# Patient Record
Sex: Female | Born: 1950 | Race: White | Hispanic: No | Smoking: Never smoker
Health system: Southern US, Community
[De-identification: ages and names within clinical notes are randomized; demographics above are authoritative.]

## PROBLEM LIST (undated history)

## (undated) DIAGNOSIS — N183 Chronic kidney disease, stage 3 unspecified: Secondary | ICD-10-CM

## (undated) DIAGNOSIS — S42309A Unspecified fracture of shaft of humerus, unspecified arm, initial encounter for closed fracture: Secondary | ICD-10-CM

## (undated) DIAGNOSIS — I493 Ventricular premature depolarization: Secondary | ICD-10-CM

## (undated) DIAGNOSIS — Z96612 Presence of left artificial shoulder joint: Principal | ICD-10-CM

## (undated) DIAGNOSIS — I517 Cardiomegaly: Secondary | ICD-10-CM

## (undated) DIAGNOSIS — Z8719 Personal history of other diseases of the digestive system: Secondary | ICD-10-CM

## (undated) DIAGNOSIS — F32A Depression, unspecified: Secondary | ICD-10-CM

## (undated) DIAGNOSIS — F329 Major depressive disorder, single episode, unspecified: Secondary | ICD-10-CM

## (undated) DIAGNOSIS — E039 Hypothyroidism, unspecified: Secondary | ICD-10-CM

## (undated) DIAGNOSIS — I10 Essential (primary) hypertension: Secondary | ICD-10-CM

## (undated) DIAGNOSIS — N184 Chronic kidney disease, stage 4 (severe): Secondary | ICD-10-CM

## (undated) DIAGNOSIS — M5412 Radiculopathy, cervical region: Secondary | ICD-10-CM

## (undated) DIAGNOSIS — J189 Pneumonia, unspecified organism: Secondary | ICD-10-CM

## (undated) DIAGNOSIS — R7303 Prediabetes: Secondary | ICD-10-CM

## (undated) DIAGNOSIS — I700 Atherosclerosis of aorta: Secondary | ICD-10-CM

## (undated) DIAGNOSIS — J9601 Acute respiratory failure with hypoxia: Secondary | ICD-10-CM

## (undated) DIAGNOSIS — G8929 Other chronic pain: Secondary | ICD-10-CM

## (undated) DIAGNOSIS — M1990 Unspecified osteoarthritis, unspecified site: Secondary | ICD-10-CM

## (undated) DIAGNOSIS — I519 Heart disease, unspecified: Secondary | ICD-10-CM

## (undated) DIAGNOSIS — F419 Anxiety disorder, unspecified: Secondary | ICD-10-CM

## (undated) DIAGNOSIS — M25512 Pain in left shoulder: Secondary | ICD-10-CM

## (undated) DIAGNOSIS — N189 Chronic kidney disease, unspecified: Secondary | ICD-10-CM

## (undated) DIAGNOSIS — J45909 Unspecified asthma, uncomplicated: Secondary | ICD-10-CM

## (undated) DIAGNOSIS — D649 Anemia, unspecified: Secondary | ICD-10-CM

## (undated) HISTORY — PX: APPENDECTOMY: SHX54

## (undated) HISTORY — DX: Unspecified osteoarthritis, unspecified site: M19.90

## (undated) HISTORY — DX: Pneumonia, unspecified organism: J18.9

## (undated) HISTORY — DX: Depression, unspecified: F32.A

## (undated) HISTORY — DX: Chronic kidney disease, stage 3 (moderate): N18.3

## (undated) HISTORY — DX: Anxiety disorder, unspecified: F41.9

## (undated) HISTORY — DX: Ventricular premature depolarization: I49.3

## (undated) HISTORY — DX: Major depressive disorder, single episode, unspecified: F32.9

## (undated) HISTORY — PX: TUBAL LIGATION: SHX77

## (undated) HISTORY — DX: Chronic kidney disease, stage 3 unspecified: N18.30

## (undated) HISTORY — DX: Acute respiratory failure with hypoxia: J96.01

## (undated) HISTORY — DX: Presence of left artificial shoulder joint: Z96.612

## (undated) HISTORY — DX: Chronic kidney disease, stage 4 (severe): N18.4

---

## 2013-07-09 DIAGNOSIS — Z9181 History of falling: Secondary | ICD-10-CM | POA: Diagnosis not present

## 2013-07-09 DIAGNOSIS — Z0000 Encounter for general adult medical examination without abnormal findings: Secondary | ICD-10-CM | POA: Diagnosis not present

## 2013-07-09 DIAGNOSIS — Z1331 Encounter for screening for depression: Secondary | ICD-10-CM | POA: Diagnosis not present

## 2013-07-09 DIAGNOSIS — Z6841 Body Mass Index (BMI) 40.0 and over, adult: Secondary | ICD-10-CM | POA: Diagnosis not present

## 2013-07-09 DIAGNOSIS — I10 Essential (primary) hypertension: Secondary | ICD-10-CM | POA: Diagnosis not present

## 2013-07-09 DIAGNOSIS — Z23 Encounter for immunization: Secondary | ICD-10-CM | POA: Diagnosis not present

## 2013-08-04 DIAGNOSIS — Z6841 Body Mass Index (BMI) 40.0 and over, adult: Secondary | ICD-10-CM | POA: Diagnosis not present

## 2013-08-04 DIAGNOSIS — R7309 Other abnormal glucose: Secondary | ICD-10-CM | POA: Diagnosis not present

## 2013-08-04 DIAGNOSIS — Z124 Encounter for screening for malignant neoplasm of cervix: Secondary | ICD-10-CM | POA: Diagnosis not present

## 2013-08-04 DIAGNOSIS — Z780 Asymptomatic menopausal state: Secondary | ICD-10-CM | POA: Diagnosis not present

## 2013-08-05 DIAGNOSIS — Z1231 Encounter for screening mammogram for malignant neoplasm of breast: Secondary | ICD-10-CM | POA: Diagnosis not present

## 2013-08-07 DIAGNOSIS — E119 Type 2 diabetes mellitus without complications: Secondary | ICD-10-CM | POA: Diagnosis not present

## 2013-08-07 DIAGNOSIS — I10 Essential (primary) hypertension: Secondary | ICD-10-CM | POA: Diagnosis not present

## 2013-08-07 DIAGNOSIS — Z6841 Body Mass Index (BMI) 40.0 and over, adult: Secondary | ICD-10-CM | POA: Diagnosis not present

## 2013-08-08 DIAGNOSIS — E119 Type 2 diabetes mellitus without complications: Secondary | ICD-10-CM | POA: Diagnosis not present

## 2013-09-04 DIAGNOSIS — I10 Essential (primary) hypertension: Secondary | ICD-10-CM | POA: Diagnosis not present

## 2013-09-04 DIAGNOSIS — Z6841 Body Mass Index (BMI) 40.0 and over, adult: Secondary | ICD-10-CM | POA: Diagnosis not present

## 2013-09-26 DIAGNOSIS — E119 Type 2 diabetes mellitus without complications: Secondary | ICD-10-CM | POA: Diagnosis not present

## 2013-12-11 DIAGNOSIS — Z6841 Body Mass Index (BMI) 40.0 and over, adult: Secondary | ICD-10-CM | POA: Diagnosis not present

## 2013-12-11 DIAGNOSIS — E785 Hyperlipidemia, unspecified: Secondary | ICD-10-CM | POA: Diagnosis not present

## 2013-12-11 DIAGNOSIS — E119 Type 2 diabetes mellitus without complications: Secondary | ICD-10-CM | POA: Diagnosis not present

## 2013-12-11 DIAGNOSIS — I10 Essential (primary) hypertension: Secondary | ICD-10-CM | POA: Diagnosis not present

## 2014-02-18 DIAGNOSIS — Z13820 Encounter for screening for osteoporosis: Secondary | ICD-10-CM | POA: Diagnosis not present

## 2014-04-08 DIAGNOSIS — Z6841 Body Mass Index (BMI) 40.0 and over, adult: Secondary | ICD-10-CM | POA: Diagnosis not present

## 2014-04-08 DIAGNOSIS — G5600 Carpal tunnel syndrome, unspecified upper limb: Secondary | ICD-10-CM | POA: Diagnosis not present

## 2014-04-08 DIAGNOSIS — R252 Cramp and spasm: Secondary | ICD-10-CM | POA: Diagnosis not present

## 2014-06-23 DIAGNOSIS — I10 Essential (primary) hypertension: Secondary | ICD-10-CM | POA: Diagnosis not present

## 2014-06-23 DIAGNOSIS — E785 Hyperlipidemia, unspecified: Secondary | ICD-10-CM | POA: Diagnosis not present

## 2014-06-23 DIAGNOSIS — G4762 Sleep related leg cramps: Secondary | ICD-10-CM | POA: Diagnosis not present

## 2014-06-23 DIAGNOSIS — Z6841 Body Mass Index (BMI) 40.0 and over, adult: Secondary | ICD-10-CM | POA: Diagnosis not present

## 2014-06-23 DIAGNOSIS — M25561 Pain in right knee: Secondary | ICD-10-CM | POA: Diagnosis not present

## 2014-06-23 DIAGNOSIS — E119 Type 2 diabetes mellitus without complications: Secondary | ICD-10-CM | POA: Diagnosis not present

## 2014-06-23 DIAGNOSIS — S8991XA Unspecified injury of right lower leg, initial encounter: Secondary | ICD-10-CM | POA: Diagnosis not present

## 2014-06-23 DIAGNOSIS — M1711 Unilateral primary osteoarthritis, right knee: Secondary | ICD-10-CM | POA: Diagnosis not present

## 2014-06-23 DIAGNOSIS — R296 Repeated falls: Secondary | ICD-10-CM | POA: Diagnosis not present

## 2014-07-10 DIAGNOSIS — M79661 Pain in right lower leg: Secondary | ICD-10-CM | POA: Diagnosis not present

## 2014-07-10 DIAGNOSIS — M1711 Unilateral primary osteoarthritis, right knee: Secondary | ICD-10-CM | POA: Diagnosis not present

## 2014-07-10 DIAGNOSIS — M25661 Stiffness of right knee, not elsewhere classified: Secondary | ICD-10-CM | POA: Diagnosis not present

## 2014-07-29 DIAGNOSIS — M25561 Pain in right knee: Secondary | ICD-10-CM | POA: Diagnosis not present

## 2014-07-29 DIAGNOSIS — M1712 Unilateral primary osteoarthritis, left knee: Secondary | ICD-10-CM | POA: Diagnosis not present

## 2014-07-29 DIAGNOSIS — M1711 Unilateral primary osteoarthritis, right knee: Secondary | ICD-10-CM | POA: Diagnosis not present

## 2014-08-04 DIAGNOSIS — Z6841 Body Mass Index (BMI) 40.0 and over, adult: Secondary | ICD-10-CM | POA: Diagnosis not present

## 2014-08-04 DIAGNOSIS — M170 Bilateral primary osteoarthritis of knee: Secondary | ICD-10-CM | POA: Diagnosis not present

## 2014-08-08 DIAGNOSIS — G4762 Sleep related leg cramps: Secondary | ICD-10-CM | POA: Diagnosis not present

## 2014-08-08 DIAGNOSIS — E785 Hyperlipidemia, unspecified: Secondary | ICD-10-CM | POA: Diagnosis not present

## 2014-08-08 DIAGNOSIS — F419 Anxiety disorder, unspecified: Secondary | ICD-10-CM | POA: Diagnosis not present

## 2014-08-08 DIAGNOSIS — M4316 Spondylolisthesis, lumbar region: Secondary | ICD-10-CM | POA: Diagnosis not present

## 2014-08-08 DIAGNOSIS — E119 Type 2 diabetes mellitus without complications: Secondary | ICD-10-CM | POA: Diagnosis not present

## 2014-08-08 DIAGNOSIS — Z6841 Body Mass Index (BMI) 40.0 and over, adult: Secondary | ICD-10-CM | POA: Diagnosis not present

## 2014-08-08 DIAGNOSIS — M5136 Other intervertebral disc degeneration, lumbar region: Secondary | ICD-10-CM | POA: Diagnosis not present

## 2014-08-08 DIAGNOSIS — Z9181 History of falling: Secondary | ICD-10-CM | POA: Diagnosis not present

## 2014-08-08 DIAGNOSIS — I10 Essential (primary) hypertension: Secondary | ICD-10-CM | POA: Diagnosis not present

## 2014-08-10 DIAGNOSIS — I10 Essential (primary) hypertension: Secondary | ICD-10-CM | POA: Diagnosis not present

## 2014-08-10 DIAGNOSIS — G4762 Sleep related leg cramps: Secondary | ICD-10-CM | POA: Diagnosis not present

## 2014-08-10 DIAGNOSIS — E785 Hyperlipidemia, unspecified: Secondary | ICD-10-CM | POA: Diagnosis not present

## 2014-08-10 DIAGNOSIS — M4316 Spondylolisthesis, lumbar region: Secondary | ICD-10-CM | POA: Diagnosis not present

## 2014-08-10 DIAGNOSIS — E119 Type 2 diabetes mellitus without complications: Secondary | ICD-10-CM | POA: Diagnosis not present

## 2014-08-10 DIAGNOSIS — M5136 Other intervertebral disc degeneration, lumbar region: Secondary | ICD-10-CM | POA: Diagnosis not present

## 2014-08-11 DIAGNOSIS — M4316 Spondylolisthesis, lumbar region: Secondary | ICD-10-CM | POA: Diagnosis not present

## 2014-08-11 DIAGNOSIS — I10 Essential (primary) hypertension: Secondary | ICD-10-CM | POA: Diagnosis not present

## 2014-08-11 DIAGNOSIS — M5136 Other intervertebral disc degeneration, lumbar region: Secondary | ICD-10-CM | POA: Diagnosis not present

## 2014-08-11 DIAGNOSIS — E119 Type 2 diabetes mellitus without complications: Secondary | ICD-10-CM | POA: Diagnosis not present

## 2014-08-11 DIAGNOSIS — G4762 Sleep related leg cramps: Secondary | ICD-10-CM | POA: Diagnosis not present

## 2014-08-11 DIAGNOSIS — E785 Hyperlipidemia, unspecified: Secondary | ICD-10-CM | POA: Diagnosis not present

## 2014-08-12 DIAGNOSIS — I10 Essential (primary) hypertension: Secondary | ICD-10-CM | POA: Diagnosis not present

## 2014-08-12 DIAGNOSIS — G4762 Sleep related leg cramps: Secondary | ICD-10-CM | POA: Diagnosis not present

## 2014-08-12 DIAGNOSIS — M4316 Spondylolisthesis, lumbar region: Secondary | ICD-10-CM | POA: Diagnosis not present

## 2014-08-12 DIAGNOSIS — M5136 Other intervertebral disc degeneration, lumbar region: Secondary | ICD-10-CM | POA: Diagnosis not present

## 2014-08-12 DIAGNOSIS — E119 Type 2 diabetes mellitus without complications: Secondary | ICD-10-CM | POA: Diagnosis not present

## 2014-08-12 DIAGNOSIS — E785 Hyperlipidemia, unspecified: Secondary | ICD-10-CM | POA: Diagnosis not present

## 2014-08-13 DIAGNOSIS — G4762 Sleep related leg cramps: Secondary | ICD-10-CM | POA: Diagnosis not present

## 2014-08-13 DIAGNOSIS — I10 Essential (primary) hypertension: Secondary | ICD-10-CM | POA: Diagnosis not present

## 2014-08-13 DIAGNOSIS — E119 Type 2 diabetes mellitus without complications: Secondary | ICD-10-CM | POA: Diagnosis not present

## 2014-08-13 DIAGNOSIS — M5136 Other intervertebral disc degeneration, lumbar region: Secondary | ICD-10-CM | POA: Diagnosis not present

## 2014-08-13 DIAGNOSIS — M4316 Spondylolisthesis, lumbar region: Secondary | ICD-10-CM | POA: Diagnosis not present

## 2014-08-13 DIAGNOSIS — E785 Hyperlipidemia, unspecified: Secondary | ICD-10-CM | POA: Diagnosis not present

## 2014-08-14 DIAGNOSIS — I10 Essential (primary) hypertension: Secondary | ICD-10-CM | POA: Diagnosis not present

## 2014-08-14 DIAGNOSIS — G4762 Sleep related leg cramps: Secondary | ICD-10-CM | POA: Diagnosis not present

## 2014-08-14 DIAGNOSIS — E785 Hyperlipidemia, unspecified: Secondary | ICD-10-CM | POA: Diagnosis not present

## 2014-08-14 DIAGNOSIS — M4316 Spondylolisthesis, lumbar region: Secondary | ICD-10-CM | POA: Diagnosis not present

## 2014-08-14 DIAGNOSIS — E119 Type 2 diabetes mellitus without complications: Secondary | ICD-10-CM | POA: Diagnosis not present

## 2014-08-14 DIAGNOSIS — M5136 Other intervertebral disc degeneration, lumbar region: Secondary | ICD-10-CM | POA: Diagnosis not present

## 2014-08-17 DIAGNOSIS — E119 Type 2 diabetes mellitus without complications: Secondary | ICD-10-CM | POA: Diagnosis not present

## 2014-08-17 DIAGNOSIS — I10 Essential (primary) hypertension: Secondary | ICD-10-CM | POA: Diagnosis not present

## 2014-08-17 DIAGNOSIS — M5136 Other intervertebral disc degeneration, lumbar region: Secondary | ICD-10-CM | POA: Diagnosis not present

## 2014-08-17 DIAGNOSIS — G4762 Sleep related leg cramps: Secondary | ICD-10-CM | POA: Diagnosis not present

## 2014-08-17 DIAGNOSIS — M4316 Spondylolisthesis, lumbar region: Secondary | ICD-10-CM | POA: Diagnosis not present

## 2014-08-17 DIAGNOSIS — E785 Hyperlipidemia, unspecified: Secondary | ICD-10-CM | POA: Diagnosis not present

## 2014-08-18 DIAGNOSIS — M5136 Other intervertebral disc degeneration, lumbar region: Secondary | ICD-10-CM | POA: Diagnosis not present

## 2014-08-18 DIAGNOSIS — E785 Hyperlipidemia, unspecified: Secondary | ICD-10-CM | POA: Diagnosis not present

## 2014-08-18 DIAGNOSIS — I10 Essential (primary) hypertension: Secondary | ICD-10-CM | POA: Diagnosis not present

## 2014-08-18 DIAGNOSIS — M4316 Spondylolisthesis, lumbar region: Secondary | ICD-10-CM | POA: Diagnosis not present

## 2014-08-18 DIAGNOSIS — E119 Type 2 diabetes mellitus without complications: Secondary | ICD-10-CM | POA: Diagnosis not present

## 2014-08-18 DIAGNOSIS — G4762 Sleep related leg cramps: Secondary | ICD-10-CM | POA: Diagnosis not present

## 2014-08-19 DIAGNOSIS — M4316 Spondylolisthesis, lumbar region: Secondary | ICD-10-CM | POA: Diagnosis not present

## 2014-08-19 DIAGNOSIS — E785 Hyperlipidemia, unspecified: Secondary | ICD-10-CM | POA: Diagnosis not present

## 2014-08-19 DIAGNOSIS — M5136 Other intervertebral disc degeneration, lumbar region: Secondary | ICD-10-CM | POA: Diagnosis not present

## 2014-08-19 DIAGNOSIS — E119 Type 2 diabetes mellitus without complications: Secondary | ICD-10-CM | POA: Diagnosis not present

## 2014-08-19 DIAGNOSIS — G4762 Sleep related leg cramps: Secondary | ICD-10-CM | POA: Diagnosis not present

## 2014-08-19 DIAGNOSIS — I10 Essential (primary) hypertension: Secondary | ICD-10-CM | POA: Diagnosis not present

## 2014-08-22 DIAGNOSIS — M5136 Other intervertebral disc degeneration, lumbar region: Secondary | ICD-10-CM | POA: Diagnosis not present

## 2014-08-22 DIAGNOSIS — E119 Type 2 diabetes mellitus without complications: Secondary | ICD-10-CM | POA: Diagnosis not present

## 2014-08-22 DIAGNOSIS — E785 Hyperlipidemia, unspecified: Secondary | ICD-10-CM | POA: Diagnosis not present

## 2014-08-22 DIAGNOSIS — I10 Essential (primary) hypertension: Secondary | ICD-10-CM | POA: Diagnosis not present

## 2014-08-22 DIAGNOSIS — M4316 Spondylolisthesis, lumbar region: Secondary | ICD-10-CM | POA: Diagnosis not present

## 2014-08-22 DIAGNOSIS — G4762 Sleep related leg cramps: Secondary | ICD-10-CM | POA: Diagnosis not present

## 2014-08-24 DIAGNOSIS — E119 Type 2 diabetes mellitus without complications: Secondary | ICD-10-CM | POA: Diagnosis not present

## 2014-08-24 DIAGNOSIS — G4762 Sleep related leg cramps: Secondary | ICD-10-CM | POA: Diagnosis not present

## 2014-08-24 DIAGNOSIS — E785 Hyperlipidemia, unspecified: Secondary | ICD-10-CM | POA: Diagnosis not present

## 2014-08-24 DIAGNOSIS — M5136 Other intervertebral disc degeneration, lumbar region: Secondary | ICD-10-CM | POA: Diagnosis not present

## 2014-08-24 DIAGNOSIS — I10 Essential (primary) hypertension: Secondary | ICD-10-CM | POA: Diagnosis not present

## 2014-08-24 DIAGNOSIS — M4316 Spondylolisthesis, lumbar region: Secondary | ICD-10-CM | POA: Diagnosis not present

## 2014-08-25 DIAGNOSIS — E119 Type 2 diabetes mellitus without complications: Secondary | ICD-10-CM | POA: Diagnosis not present

## 2014-08-25 DIAGNOSIS — M5136 Other intervertebral disc degeneration, lumbar region: Secondary | ICD-10-CM | POA: Diagnosis not present

## 2014-08-25 DIAGNOSIS — E785 Hyperlipidemia, unspecified: Secondary | ICD-10-CM | POA: Diagnosis not present

## 2014-08-25 DIAGNOSIS — M4316 Spondylolisthesis, lumbar region: Secondary | ICD-10-CM | POA: Diagnosis not present

## 2014-08-25 DIAGNOSIS — I10 Essential (primary) hypertension: Secondary | ICD-10-CM | POA: Diagnosis not present

## 2014-08-25 DIAGNOSIS — G4762 Sleep related leg cramps: Secondary | ICD-10-CM | POA: Diagnosis not present

## 2014-08-27 DIAGNOSIS — G4762 Sleep related leg cramps: Secondary | ICD-10-CM | POA: Diagnosis not present

## 2014-08-27 DIAGNOSIS — E119 Type 2 diabetes mellitus without complications: Secondary | ICD-10-CM | POA: Diagnosis not present

## 2014-08-27 DIAGNOSIS — M4316 Spondylolisthesis, lumbar region: Secondary | ICD-10-CM | POA: Diagnosis not present

## 2014-08-27 DIAGNOSIS — I10 Essential (primary) hypertension: Secondary | ICD-10-CM | POA: Diagnosis not present

## 2014-08-27 DIAGNOSIS — M5136 Other intervertebral disc degeneration, lumbar region: Secondary | ICD-10-CM | POA: Diagnosis not present

## 2014-08-27 DIAGNOSIS — E785 Hyperlipidemia, unspecified: Secondary | ICD-10-CM | POA: Diagnosis not present

## 2014-08-31 DIAGNOSIS — E785 Hyperlipidemia, unspecified: Secondary | ICD-10-CM | POA: Diagnosis not present

## 2014-08-31 DIAGNOSIS — E119 Type 2 diabetes mellitus without complications: Secondary | ICD-10-CM | POA: Diagnosis not present

## 2014-08-31 DIAGNOSIS — I10 Essential (primary) hypertension: Secondary | ICD-10-CM | POA: Diagnosis not present

## 2014-08-31 DIAGNOSIS — M4316 Spondylolisthesis, lumbar region: Secondary | ICD-10-CM | POA: Diagnosis not present

## 2014-08-31 DIAGNOSIS — M5136 Other intervertebral disc degeneration, lumbar region: Secondary | ICD-10-CM | POA: Diagnosis not present

## 2014-08-31 DIAGNOSIS — G4762 Sleep related leg cramps: Secondary | ICD-10-CM | POA: Diagnosis not present

## 2014-09-02 DIAGNOSIS — G4762 Sleep related leg cramps: Secondary | ICD-10-CM | POA: Diagnosis not present

## 2014-09-02 DIAGNOSIS — E785 Hyperlipidemia, unspecified: Secondary | ICD-10-CM | POA: Diagnosis not present

## 2014-09-02 DIAGNOSIS — I10 Essential (primary) hypertension: Secondary | ICD-10-CM | POA: Diagnosis not present

## 2014-09-02 DIAGNOSIS — M4316 Spondylolisthesis, lumbar region: Secondary | ICD-10-CM | POA: Diagnosis not present

## 2014-09-02 DIAGNOSIS — E119 Type 2 diabetes mellitus without complications: Secondary | ICD-10-CM | POA: Diagnosis not present

## 2014-09-02 DIAGNOSIS — M5136 Other intervertebral disc degeneration, lumbar region: Secondary | ICD-10-CM | POA: Diagnosis not present

## 2014-09-03 DIAGNOSIS — E119 Type 2 diabetes mellitus without complications: Secondary | ICD-10-CM | POA: Diagnosis not present

## 2014-09-03 DIAGNOSIS — M4316 Spondylolisthesis, lumbar region: Secondary | ICD-10-CM | POA: Diagnosis not present

## 2014-09-03 DIAGNOSIS — I10 Essential (primary) hypertension: Secondary | ICD-10-CM | POA: Diagnosis not present

## 2014-09-03 DIAGNOSIS — G4762 Sleep related leg cramps: Secondary | ICD-10-CM | POA: Diagnosis not present

## 2014-09-03 DIAGNOSIS — M5136 Other intervertebral disc degeneration, lumbar region: Secondary | ICD-10-CM | POA: Diagnosis not present

## 2014-09-03 DIAGNOSIS — E785 Hyperlipidemia, unspecified: Secondary | ICD-10-CM | POA: Diagnosis not present

## 2014-09-09 DIAGNOSIS — G4762 Sleep related leg cramps: Secondary | ICD-10-CM | POA: Diagnosis not present

## 2014-09-09 DIAGNOSIS — M5136 Other intervertebral disc degeneration, lumbar region: Secondary | ICD-10-CM | POA: Diagnosis not present

## 2014-09-09 DIAGNOSIS — E119 Type 2 diabetes mellitus without complications: Secondary | ICD-10-CM | POA: Diagnosis not present

## 2014-09-09 DIAGNOSIS — M4316 Spondylolisthesis, lumbar region: Secondary | ICD-10-CM | POA: Diagnosis not present

## 2014-09-09 DIAGNOSIS — E785 Hyperlipidemia, unspecified: Secondary | ICD-10-CM | POA: Diagnosis not present

## 2014-09-09 DIAGNOSIS — I10 Essential (primary) hypertension: Secondary | ICD-10-CM | POA: Diagnosis not present

## 2014-09-11 DIAGNOSIS — E119 Type 2 diabetes mellitus without complications: Secondary | ICD-10-CM | POA: Diagnosis not present

## 2014-09-11 DIAGNOSIS — E785 Hyperlipidemia, unspecified: Secondary | ICD-10-CM | POA: Diagnosis not present

## 2014-09-11 DIAGNOSIS — M4316 Spondylolisthesis, lumbar region: Secondary | ICD-10-CM | POA: Diagnosis not present

## 2014-09-11 DIAGNOSIS — G4762 Sleep related leg cramps: Secondary | ICD-10-CM | POA: Diagnosis not present

## 2014-09-11 DIAGNOSIS — I10 Essential (primary) hypertension: Secondary | ICD-10-CM | POA: Diagnosis not present

## 2014-09-11 DIAGNOSIS — M5136 Other intervertebral disc degeneration, lumbar region: Secondary | ICD-10-CM | POA: Diagnosis not present

## 2014-09-15 DIAGNOSIS — E785 Hyperlipidemia, unspecified: Secondary | ICD-10-CM | POA: Diagnosis not present

## 2014-09-15 DIAGNOSIS — M5136 Other intervertebral disc degeneration, lumbar region: Secondary | ICD-10-CM | POA: Diagnosis not present

## 2014-09-15 DIAGNOSIS — M4316 Spondylolisthesis, lumbar region: Secondary | ICD-10-CM | POA: Diagnosis not present

## 2014-09-15 DIAGNOSIS — E119 Type 2 diabetes mellitus without complications: Secondary | ICD-10-CM | POA: Diagnosis not present

## 2014-09-15 DIAGNOSIS — I10 Essential (primary) hypertension: Secondary | ICD-10-CM | POA: Diagnosis not present

## 2014-09-15 DIAGNOSIS — G4762 Sleep related leg cramps: Secondary | ICD-10-CM | POA: Diagnosis not present

## 2014-09-17 DIAGNOSIS — M5136 Other intervertebral disc degeneration, lumbar region: Secondary | ICD-10-CM | POA: Diagnosis not present

## 2014-09-17 DIAGNOSIS — E119 Type 2 diabetes mellitus without complications: Secondary | ICD-10-CM | POA: Diagnosis not present

## 2014-09-17 DIAGNOSIS — E785 Hyperlipidemia, unspecified: Secondary | ICD-10-CM | POA: Diagnosis not present

## 2014-09-17 DIAGNOSIS — M4316 Spondylolisthesis, lumbar region: Secondary | ICD-10-CM | POA: Diagnosis not present

## 2014-09-17 DIAGNOSIS — I10 Essential (primary) hypertension: Secondary | ICD-10-CM | POA: Diagnosis not present

## 2014-09-17 DIAGNOSIS — G4762 Sleep related leg cramps: Secondary | ICD-10-CM | POA: Diagnosis not present

## 2014-09-23 DIAGNOSIS — I10 Essential (primary) hypertension: Secondary | ICD-10-CM | POA: Diagnosis not present

## 2014-09-23 DIAGNOSIS — M5136 Other intervertebral disc degeneration, lumbar region: Secondary | ICD-10-CM | POA: Diagnosis not present

## 2014-09-23 DIAGNOSIS — M4316 Spondylolisthesis, lumbar region: Secondary | ICD-10-CM | POA: Diagnosis not present

## 2014-09-23 DIAGNOSIS — G4762 Sleep related leg cramps: Secondary | ICD-10-CM | POA: Diagnosis not present

## 2014-09-23 DIAGNOSIS — E785 Hyperlipidemia, unspecified: Secondary | ICD-10-CM | POA: Diagnosis not present

## 2014-09-23 DIAGNOSIS — E119 Type 2 diabetes mellitus without complications: Secondary | ICD-10-CM | POA: Diagnosis not present

## 2014-09-25 DIAGNOSIS — E119 Type 2 diabetes mellitus without complications: Secondary | ICD-10-CM | POA: Diagnosis not present

## 2014-09-25 DIAGNOSIS — E785 Hyperlipidemia, unspecified: Secondary | ICD-10-CM | POA: Diagnosis not present

## 2014-09-25 DIAGNOSIS — I10 Essential (primary) hypertension: Secondary | ICD-10-CM | POA: Diagnosis not present

## 2014-09-25 DIAGNOSIS — M5136 Other intervertebral disc degeneration, lumbar region: Secondary | ICD-10-CM | POA: Diagnosis not present

## 2014-09-25 DIAGNOSIS — G4762 Sleep related leg cramps: Secondary | ICD-10-CM | POA: Diagnosis not present

## 2014-09-25 DIAGNOSIS — M4316 Spondylolisthesis, lumbar region: Secondary | ICD-10-CM | POA: Diagnosis not present

## 2014-09-29 DIAGNOSIS — I10 Essential (primary) hypertension: Secondary | ICD-10-CM | POA: Diagnosis not present

## 2014-09-29 DIAGNOSIS — G4762 Sleep related leg cramps: Secondary | ICD-10-CM | POA: Diagnosis not present

## 2014-09-29 DIAGNOSIS — E119 Type 2 diabetes mellitus without complications: Secondary | ICD-10-CM | POA: Diagnosis not present

## 2014-09-29 DIAGNOSIS — M4316 Spondylolisthesis, lumbar region: Secondary | ICD-10-CM | POA: Diagnosis not present

## 2014-09-29 DIAGNOSIS — E785 Hyperlipidemia, unspecified: Secondary | ICD-10-CM | POA: Diagnosis not present

## 2014-09-29 DIAGNOSIS — M5136 Other intervertebral disc degeneration, lumbar region: Secondary | ICD-10-CM | POA: Diagnosis not present

## 2014-10-01 DIAGNOSIS — E119 Type 2 diabetes mellitus without complications: Secondary | ICD-10-CM | POA: Diagnosis not present

## 2014-10-01 DIAGNOSIS — E785 Hyperlipidemia, unspecified: Secondary | ICD-10-CM | POA: Diagnosis not present

## 2014-10-01 DIAGNOSIS — M4316 Spondylolisthesis, lumbar region: Secondary | ICD-10-CM | POA: Diagnosis not present

## 2014-10-01 DIAGNOSIS — G4762 Sleep related leg cramps: Secondary | ICD-10-CM | POA: Diagnosis not present

## 2014-10-01 DIAGNOSIS — M5136 Other intervertebral disc degeneration, lumbar region: Secondary | ICD-10-CM | POA: Diagnosis not present

## 2014-10-01 DIAGNOSIS — I10 Essential (primary) hypertension: Secondary | ICD-10-CM | POA: Diagnosis not present

## 2014-10-05 DIAGNOSIS — G4762 Sleep related leg cramps: Secondary | ICD-10-CM | POA: Diagnosis not present

## 2014-10-05 DIAGNOSIS — E785 Hyperlipidemia, unspecified: Secondary | ICD-10-CM | POA: Diagnosis not present

## 2014-10-05 DIAGNOSIS — M5136 Other intervertebral disc degeneration, lumbar region: Secondary | ICD-10-CM | POA: Diagnosis not present

## 2014-10-05 DIAGNOSIS — I10 Essential (primary) hypertension: Secondary | ICD-10-CM | POA: Diagnosis not present

## 2014-10-05 DIAGNOSIS — E119 Type 2 diabetes mellitus without complications: Secondary | ICD-10-CM | POA: Diagnosis not present

## 2014-10-05 DIAGNOSIS — M4316 Spondylolisthesis, lumbar region: Secondary | ICD-10-CM | POA: Diagnosis not present

## 2014-10-12 DIAGNOSIS — E119 Type 2 diabetes mellitus without complications: Secondary | ICD-10-CM | POA: Diagnosis not present

## 2014-10-12 DIAGNOSIS — Z1389 Encounter for screening for other disorder: Secondary | ICD-10-CM | POA: Diagnosis not present

## 2014-10-12 DIAGNOSIS — G4762 Sleep related leg cramps: Secondary | ICD-10-CM | POA: Diagnosis not present

## 2014-10-12 DIAGNOSIS — E785 Hyperlipidemia, unspecified: Secondary | ICD-10-CM | POA: Diagnosis not present

## 2014-10-12 DIAGNOSIS — Z6841 Body Mass Index (BMI) 40.0 and over, adult: Secondary | ICD-10-CM | POA: Diagnosis not present

## 2014-10-12 DIAGNOSIS — I10 Essential (primary) hypertension: Secondary | ICD-10-CM | POA: Diagnosis not present

## 2014-10-12 DIAGNOSIS — Z9181 History of falling: Secondary | ICD-10-CM | POA: Diagnosis not present

## 2014-10-20 DIAGNOSIS — E119 Type 2 diabetes mellitus without complications: Secondary | ICD-10-CM | POA: Diagnosis not present

## 2014-11-03 DIAGNOSIS — S41112A Laceration without foreign body of left upper arm, initial encounter: Secondary | ICD-10-CM | POA: Diagnosis not present

## 2014-11-03 DIAGNOSIS — Z6841 Body Mass Index (BMI) 40.0 and over, adult: Secondary | ICD-10-CM | POA: Diagnosis not present

## 2014-11-03 DIAGNOSIS — Z23 Encounter for immunization: Secondary | ICD-10-CM | POA: Diagnosis not present

## 2015-01-18 DIAGNOSIS — I10 Essential (primary) hypertension: Secondary | ICD-10-CM | POA: Diagnosis not present

## 2015-01-18 DIAGNOSIS — E785 Hyperlipidemia, unspecified: Secondary | ICD-10-CM | POA: Diagnosis not present

## 2015-01-18 DIAGNOSIS — E119 Type 2 diabetes mellitus without complications: Secondary | ICD-10-CM | POA: Diagnosis not present

## 2015-01-18 DIAGNOSIS — Z1231 Encounter for screening mammogram for malignant neoplasm of breast: Secondary | ICD-10-CM | POA: Diagnosis not present

## 2015-04-27 DIAGNOSIS — I10 Essential (primary) hypertension: Secondary | ICD-10-CM | POA: Diagnosis not present

## 2015-04-27 DIAGNOSIS — E785 Hyperlipidemia, unspecified: Secondary | ICD-10-CM | POA: Diagnosis not present

## 2015-04-27 DIAGNOSIS — Z6841 Body Mass Index (BMI) 40.0 and over, adult: Secondary | ICD-10-CM | POA: Diagnosis not present

## 2015-04-27 DIAGNOSIS — Z1389 Encounter for screening for other disorder: Secondary | ICD-10-CM | POA: Diagnosis not present

## 2015-04-27 DIAGNOSIS — E119 Type 2 diabetes mellitus without complications: Secondary | ICD-10-CM | POA: Diagnosis not present

## 2015-07-08 DIAGNOSIS — Z6841 Body Mass Index (BMI) 40.0 and over, adult: Secondary | ICD-10-CM | POA: Diagnosis not present

## 2015-07-08 DIAGNOSIS — M5416 Radiculopathy, lumbar region: Secondary | ICD-10-CM | POA: Diagnosis not present

## 2015-07-08 DIAGNOSIS — F419 Anxiety disorder, unspecified: Secondary | ICD-10-CM | POA: Diagnosis not present

## 2015-07-08 DIAGNOSIS — M47816 Spondylosis without myelopathy or radiculopathy, lumbar region: Secondary | ICD-10-CM | POA: Diagnosis not present

## 2015-07-08 DIAGNOSIS — M1711 Unilateral primary osteoarthritis, right knee: Secondary | ICD-10-CM | POA: Diagnosis not present

## 2015-07-08 DIAGNOSIS — M9983 Other biomechanical lesions of lumbar region: Secondary | ICD-10-CM | POA: Diagnosis not present

## 2015-07-11 DIAGNOSIS — I872 Venous insufficiency (chronic) (peripheral): Secondary | ICD-10-CM | POA: Diagnosis not present

## 2015-07-11 DIAGNOSIS — M170 Bilateral primary osteoarthritis of knee: Secondary | ICD-10-CM | POA: Diagnosis not present

## 2015-07-11 DIAGNOSIS — R609 Edema, unspecified: Secondary | ICD-10-CM | POA: Diagnosis not present

## 2015-07-11 DIAGNOSIS — E119 Type 2 diabetes mellitus without complications: Secondary | ICD-10-CM | POA: Diagnosis not present

## 2015-07-11 DIAGNOSIS — M5136 Other intervertebral disc degeneration, lumbar region: Secondary | ICD-10-CM | POA: Diagnosis not present

## 2015-07-11 DIAGNOSIS — I10 Essential (primary) hypertension: Secondary | ICD-10-CM | POA: Diagnosis not present

## 2015-07-11 DIAGNOSIS — M6281 Muscle weakness (generalized): Secondary | ICD-10-CM | POA: Diagnosis not present

## 2015-07-11 DIAGNOSIS — F419 Anxiety disorder, unspecified: Secondary | ICD-10-CM | POA: Diagnosis not present

## 2015-07-13 DIAGNOSIS — I872 Venous insufficiency (chronic) (peripheral): Secondary | ICD-10-CM | POA: Diagnosis not present

## 2015-07-13 DIAGNOSIS — F419 Anxiety disorder, unspecified: Secondary | ICD-10-CM | POA: Diagnosis not present

## 2015-07-13 DIAGNOSIS — I10 Essential (primary) hypertension: Secondary | ICD-10-CM | POA: Diagnosis not present

## 2015-07-13 DIAGNOSIS — M5136 Other intervertebral disc degeneration, lumbar region: Secondary | ICD-10-CM | POA: Diagnosis not present

## 2015-07-13 DIAGNOSIS — E119 Type 2 diabetes mellitus without complications: Secondary | ICD-10-CM | POA: Diagnosis not present

## 2015-07-13 DIAGNOSIS — M170 Bilateral primary osteoarthritis of knee: Secondary | ICD-10-CM | POA: Diagnosis not present

## 2015-07-14 DIAGNOSIS — M1711 Unilateral primary osteoarthritis, right knee: Secondary | ICD-10-CM | POA: Diagnosis not present

## 2015-07-14 DIAGNOSIS — M25561 Pain in right knee: Secondary | ICD-10-CM | POA: Diagnosis not present

## 2015-07-16 DIAGNOSIS — E119 Type 2 diabetes mellitus without complications: Secondary | ICD-10-CM | POA: Diagnosis not present

## 2015-07-16 DIAGNOSIS — I10 Essential (primary) hypertension: Secondary | ICD-10-CM | POA: Diagnosis not present

## 2015-07-16 DIAGNOSIS — M170 Bilateral primary osteoarthritis of knee: Secondary | ICD-10-CM | POA: Diagnosis not present

## 2015-07-16 DIAGNOSIS — M5136 Other intervertebral disc degeneration, lumbar region: Secondary | ICD-10-CM | POA: Diagnosis not present

## 2015-07-16 DIAGNOSIS — I872 Venous insufficiency (chronic) (peripheral): Secondary | ICD-10-CM | POA: Diagnosis not present

## 2015-07-16 DIAGNOSIS — F419 Anxiety disorder, unspecified: Secondary | ICD-10-CM | POA: Diagnosis not present

## 2015-07-19 DIAGNOSIS — F419 Anxiety disorder, unspecified: Secondary | ICD-10-CM | POA: Diagnosis not present

## 2015-07-19 DIAGNOSIS — M5136 Other intervertebral disc degeneration, lumbar region: Secondary | ICD-10-CM | POA: Diagnosis not present

## 2015-07-19 DIAGNOSIS — I10 Essential (primary) hypertension: Secondary | ICD-10-CM | POA: Diagnosis not present

## 2015-07-19 DIAGNOSIS — E119 Type 2 diabetes mellitus without complications: Secondary | ICD-10-CM | POA: Diagnosis not present

## 2015-07-19 DIAGNOSIS — I872 Venous insufficiency (chronic) (peripheral): Secondary | ICD-10-CM | POA: Diagnosis not present

## 2015-07-19 DIAGNOSIS — M170 Bilateral primary osteoarthritis of knee: Secondary | ICD-10-CM | POA: Diagnosis not present

## 2015-08-03 DIAGNOSIS — R2689 Other abnormalities of gait and mobility: Secondary | ICD-10-CM | POA: Diagnosis not present

## 2015-08-03 DIAGNOSIS — I872 Venous insufficiency (chronic) (peripheral): Secondary | ICD-10-CM | POA: Diagnosis not present

## 2015-08-03 DIAGNOSIS — I10 Essential (primary) hypertension: Secondary | ICD-10-CM | POA: Diagnosis not present

## 2015-08-03 DIAGNOSIS — F419 Anxiety disorder, unspecified: Secondary | ICD-10-CM | POA: Diagnosis not present

## 2015-08-03 DIAGNOSIS — M5136 Other intervertebral disc degeneration, lumbar region: Secondary | ICD-10-CM | POA: Diagnosis not present

## 2015-08-03 DIAGNOSIS — Z9181 History of falling: Secondary | ICD-10-CM | POA: Diagnosis not present

## 2015-08-03 DIAGNOSIS — E119 Type 2 diabetes mellitus without complications: Secondary | ICD-10-CM | POA: Diagnosis not present

## 2015-08-03 DIAGNOSIS — M170 Bilateral primary osteoarthritis of knee: Secondary | ICD-10-CM | POA: Diagnosis not present

## 2015-08-03 DIAGNOSIS — R54 Age-related physical debility: Secondary | ICD-10-CM | POA: Diagnosis not present

## 2015-08-05 DIAGNOSIS — I872 Venous insufficiency (chronic) (peripheral): Secondary | ICD-10-CM | POA: Diagnosis not present

## 2015-08-05 DIAGNOSIS — E119 Type 2 diabetes mellitus without complications: Secondary | ICD-10-CM | POA: Diagnosis not present

## 2015-08-05 DIAGNOSIS — I10 Essential (primary) hypertension: Secondary | ICD-10-CM | POA: Diagnosis not present

## 2015-08-05 DIAGNOSIS — R2689 Other abnormalities of gait and mobility: Secondary | ICD-10-CM | POA: Diagnosis not present

## 2015-08-05 DIAGNOSIS — R54 Age-related physical debility: Secondary | ICD-10-CM | POA: Diagnosis not present

## 2015-08-05 DIAGNOSIS — M170 Bilateral primary osteoarthritis of knee: Secondary | ICD-10-CM | POA: Diagnosis not present

## 2015-08-06 DIAGNOSIS — I872 Venous insufficiency (chronic) (peripheral): Secondary | ICD-10-CM | POA: Diagnosis not present

## 2015-08-06 DIAGNOSIS — R54 Age-related physical debility: Secondary | ICD-10-CM | POA: Diagnosis not present

## 2015-08-06 DIAGNOSIS — E119 Type 2 diabetes mellitus without complications: Secondary | ICD-10-CM | POA: Diagnosis not present

## 2015-08-06 DIAGNOSIS — M170 Bilateral primary osteoarthritis of knee: Secondary | ICD-10-CM | POA: Diagnosis not present

## 2015-08-06 DIAGNOSIS — R2689 Other abnormalities of gait and mobility: Secondary | ICD-10-CM | POA: Diagnosis not present

## 2015-08-06 DIAGNOSIS — I10 Essential (primary) hypertension: Secondary | ICD-10-CM | POA: Diagnosis not present

## 2015-08-09 DIAGNOSIS — R54 Age-related physical debility: Secondary | ICD-10-CM | POA: Diagnosis not present

## 2015-08-09 DIAGNOSIS — M170 Bilateral primary osteoarthritis of knee: Secondary | ICD-10-CM | POA: Diagnosis not present

## 2015-08-09 DIAGNOSIS — E119 Type 2 diabetes mellitus without complications: Secondary | ICD-10-CM | POA: Diagnosis not present

## 2015-08-09 DIAGNOSIS — R2689 Other abnormalities of gait and mobility: Secondary | ICD-10-CM | POA: Diagnosis not present

## 2015-08-09 DIAGNOSIS — I10 Essential (primary) hypertension: Secondary | ICD-10-CM | POA: Diagnosis not present

## 2015-08-09 DIAGNOSIS — I872 Venous insufficiency (chronic) (peripheral): Secondary | ICD-10-CM | POA: Diagnosis not present

## 2015-08-11 DIAGNOSIS — I872 Venous insufficiency (chronic) (peripheral): Secondary | ICD-10-CM | POA: Diagnosis not present

## 2015-08-11 DIAGNOSIS — R54 Age-related physical debility: Secondary | ICD-10-CM | POA: Diagnosis not present

## 2015-08-11 DIAGNOSIS — I10 Essential (primary) hypertension: Secondary | ICD-10-CM | POA: Diagnosis not present

## 2015-08-11 DIAGNOSIS — E119 Type 2 diabetes mellitus without complications: Secondary | ICD-10-CM | POA: Diagnosis not present

## 2015-08-11 DIAGNOSIS — R2689 Other abnormalities of gait and mobility: Secondary | ICD-10-CM | POA: Diagnosis not present

## 2015-08-11 DIAGNOSIS — M170 Bilateral primary osteoarthritis of knee: Secondary | ICD-10-CM | POA: Diagnosis not present

## 2015-08-12 DIAGNOSIS — I10 Essential (primary) hypertension: Secondary | ICD-10-CM | POA: Diagnosis not present

## 2015-08-12 DIAGNOSIS — R54 Age-related physical debility: Secondary | ICD-10-CM | POA: Diagnosis not present

## 2015-08-12 DIAGNOSIS — E119 Type 2 diabetes mellitus without complications: Secondary | ICD-10-CM | POA: Diagnosis not present

## 2015-08-12 DIAGNOSIS — R2689 Other abnormalities of gait and mobility: Secondary | ICD-10-CM | POA: Diagnosis not present

## 2015-08-12 DIAGNOSIS — I872 Venous insufficiency (chronic) (peripheral): Secondary | ICD-10-CM | POA: Diagnosis not present

## 2015-08-12 DIAGNOSIS — M170 Bilateral primary osteoarthritis of knee: Secondary | ICD-10-CM | POA: Diagnosis not present

## 2015-08-13 DIAGNOSIS — M170 Bilateral primary osteoarthritis of knee: Secondary | ICD-10-CM | POA: Diagnosis not present

## 2015-08-13 DIAGNOSIS — I10 Essential (primary) hypertension: Secondary | ICD-10-CM | POA: Diagnosis not present

## 2015-08-13 DIAGNOSIS — R54 Age-related physical debility: Secondary | ICD-10-CM | POA: Diagnosis not present

## 2015-08-13 DIAGNOSIS — R2689 Other abnormalities of gait and mobility: Secondary | ICD-10-CM | POA: Diagnosis not present

## 2015-08-13 DIAGNOSIS — E119 Type 2 diabetes mellitus without complications: Secondary | ICD-10-CM | POA: Diagnosis not present

## 2015-08-13 DIAGNOSIS — I872 Venous insufficiency (chronic) (peripheral): Secondary | ICD-10-CM | POA: Diagnosis not present

## 2015-08-17 DIAGNOSIS — I10 Essential (primary) hypertension: Secondary | ICD-10-CM | POA: Diagnosis not present

## 2015-08-17 DIAGNOSIS — R54 Age-related physical debility: Secondary | ICD-10-CM | POA: Diagnosis not present

## 2015-08-17 DIAGNOSIS — R2689 Other abnormalities of gait and mobility: Secondary | ICD-10-CM | POA: Diagnosis not present

## 2015-08-17 DIAGNOSIS — M170 Bilateral primary osteoarthritis of knee: Secondary | ICD-10-CM | POA: Diagnosis not present

## 2015-08-17 DIAGNOSIS — E119 Type 2 diabetes mellitus without complications: Secondary | ICD-10-CM | POA: Diagnosis not present

## 2015-08-17 DIAGNOSIS — I872 Venous insufficiency (chronic) (peripheral): Secondary | ICD-10-CM | POA: Diagnosis not present

## 2015-08-18 DIAGNOSIS — I10 Essential (primary) hypertension: Secondary | ICD-10-CM | POA: Diagnosis not present

## 2015-08-18 DIAGNOSIS — R54 Age-related physical debility: Secondary | ICD-10-CM | POA: Diagnosis not present

## 2015-08-18 DIAGNOSIS — M170 Bilateral primary osteoarthritis of knee: Secondary | ICD-10-CM | POA: Diagnosis not present

## 2015-08-18 DIAGNOSIS — I872 Venous insufficiency (chronic) (peripheral): Secondary | ICD-10-CM | POA: Diagnosis not present

## 2015-08-18 DIAGNOSIS — R2689 Other abnormalities of gait and mobility: Secondary | ICD-10-CM | POA: Diagnosis not present

## 2015-08-18 DIAGNOSIS — E119 Type 2 diabetes mellitus without complications: Secondary | ICD-10-CM | POA: Diagnosis not present

## 2015-08-19 DIAGNOSIS — I872 Venous insufficiency (chronic) (peripheral): Secondary | ICD-10-CM | POA: Diagnosis not present

## 2015-08-19 DIAGNOSIS — R54 Age-related physical debility: Secondary | ICD-10-CM | POA: Diagnosis not present

## 2015-08-19 DIAGNOSIS — E119 Type 2 diabetes mellitus without complications: Secondary | ICD-10-CM | POA: Diagnosis not present

## 2015-08-19 DIAGNOSIS — M170 Bilateral primary osteoarthritis of knee: Secondary | ICD-10-CM | POA: Diagnosis not present

## 2015-08-19 DIAGNOSIS — R2689 Other abnormalities of gait and mobility: Secondary | ICD-10-CM | POA: Diagnosis not present

## 2015-08-19 DIAGNOSIS — I10 Essential (primary) hypertension: Secondary | ICD-10-CM | POA: Diagnosis not present

## 2015-08-20 DIAGNOSIS — E119 Type 2 diabetes mellitus without complications: Secondary | ICD-10-CM | POA: Diagnosis not present

## 2015-08-20 DIAGNOSIS — M170 Bilateral primary osteoarthritis of knee: Secondary | ICD-10-CM | POA: Diagnosis not present

## 2015-08-20 DIAGNOSIS — R2689 Other abnormalities of gait and mobility: Secondary | ICD-10-CM | POA: Diagnosis not present

## 2015-08-20 DIAGNOSIS — R54 Age-related physical debility: Secondary | ICD-10-CM | POA: Diagnosis not present

## 2015-08-20 DIAGNOSIS — I10 Essential (primary) hypertension: Secondary | ICD-10-CM | POA: Diagnosis not present

## 2015-08-20 DIAGNOSIS — I872 Venous insufficiency (chronic) (peripheral): Secondary | ICD-10-CM | POA: Diagnosis not present

## 2015-08-21 DIAGNOSIS — I10 Essential (primary) hypertension: Secondary | ICD-10-CM | POA: Diagnosis not present

## 2015-08-21 DIAGNOSIS — M170 Bilateral primary osteoarthritis of knee: Secondary | ICD-10-CM | POA: Diagnosis not present

## 2015-08-21 DIAGNOSIS — R54 Age-related physical debility: Secondary | ICD-10-CM | POA: Diagnosis not present

## 2015-08-21 DIAGNOSIS — R2689 Other abnormalities of gait and mobility: Secondary | ICD-10-CM | POA: Diagnosis not present

## 2015-08-21 DIAGNOSIS — E119 Type 2 diabetes mellitus without complications: Secondary | ICD-10-CM | POA: Diagnosis not present

## 2015-08-21 DIAGNOSIS — I872 Venous insufficiency (chronic) (peripheral): Secondary | ICD-10-CM | POA: Diagnosis not present

## 2015-08-23 DIAGNOSIS — Z6841 Body Mass Index (BMI) 40.0 and over, adult: Secondary | ICD-10-CM | POA: Diagnosis not present

## 2015-08-23 DIAGNOSIS — I10 Essential (primary) hypertension: Secondary | ICD-10-CM | POA: Diagnosis not present

## 2015-08-23 DIAGNOSIS — E119 Type 2 diabetes mellitus without complications: Secondary | ICD-10-CM | POA: Diagnosis not present

## 2015-08-23 DIAGNOSIS — Z1231 Encounter for screening mammogram for malignant neoplasm of breast: Secondary | ICD-10-CM | POA: Diagnosis not present

## 2015-08-23 DIAGNOSIS — M25512 Pain in left shoulder: Secondary | ICD-10-CM | POA: Diagnosis not present

## 2015-08-23 DIAGNOSIS — E785 Hyperlipidemia, unspecified: Secondary | ICD-10-CM | POA: Diagnosis not present

## 2015-08-24 DIAGNOSIS — E119 Type 2 diabetes mellitus without complications: Secondary | ICD-10-CM | POA: Diagnosis not present

## 2015-08-24 DIAGNOSIS — I872 Venous insufficiency (chronic) (peripheral): Secondary | ICD-10-CM | POA: Diagnosis not present

## 2015-08-24 DIAGNOSIS — I10 Essential (primary) hypertension: Secondary | ICD-10-CM | POA: Diagnosis not present

## 2015-08-24 DIAGNOSIS — R2689 Other abnormalities of gait and mobility: Secondary | ICD-10-CM | POA: Diagnosis not present

## 2015-08-24 DIAGNOSIS — M170 Bilateral primary osteoarthritis of knee: Secondary | ICD-10-CM | POA: Diagnosis not present

## 2015-08-24 DIAGNOSIS — R54 Age-related physical debility: Secondary | ICD-10-CM | POA: Diagnosis not present

## 2015-08-25 DIAGNOSIS — R54 Age-related physical debility: Secondary | ICD-10-CM | POA: Diagnosis not present

## 2015-08-25 DIAGNOSIS — I872 Venous insufficiency (chronic) (peripheral): Secondary | ICD-10-CM | POA: Diagnosis not present

## 2015-08-25 DIAGNOSIS — R2689 Other abnormalities of gait and mobility: Secondary | ICD-10-CM | POA: Diagnosis not present

## 2015-08-25 DIAGNOSIS — M170 Bilateral primary osteoarthritis of knee: Secondary | ICD-10-CM | POA: Diagnosis not present

## 2015-08-25 DIAGNOSIS — I10 Essential (primary) hypertension: Secondary | ICD-10-CM | POA: Diagnosis not present

## 2015-08-25 DIAGNOSIS — E119 Type 2 diabetes mellitus without complications: Secondary | ICD-10-CM | POA: Diagnosis not present

## 2015-08-26 DIAGNOSIS — R54 Age-related physical debility: Secondary | ICD-10-CM | POA: Diagnosis not present

## 2015-08-26 DIAGNOSIS — I872 Venous insufficiency (chronic) (peripheral): Secondary | ICD-10-CM | POA: Diagnosis not present

## 2015-08-26 DIAGNOSIS — E119 Type 2 diabetes mellitus without complications: Secondary | ICD-10-CM | POA: Diagnosis not present

## 2015-08-26 DIAGNOSIS — I10 Essential (primary) hypertension: Secondary | ICD-10-CM | POA: Diagnosis not present

## 2015-08-26 DIAGNOSIS — R2689 Other abnormalities of gait and mobility: Secondary | ICD-10-CM | POA: Diagnosis not present

## 2015-08-26 DIAGNOSIS — M170 Bilateral primary osteoarthritis of knee: Secondary | ICD-10-CM | POA: Diagnosis not present

## 2015-08-27 DIAGNOSIS — E119 Type 2 diabetes mellitus without complications: Secondary | ICD-10-CM | POA: Diagnosis not present

## 2015-08-27 DIAGNOSIS — R54 Age-related physical debility: Secondary | ICD-10-CM | POA: Diagnosis not present

## 2015-08-27 DIAGNOSIS — R2689 Other abnormalities of gait and mobility: Secondary | ICD-10-CM | POA: Diagnosis not present

## 2015-08-27 DIAGNOSIS — M170 Bilateral primary osteoarthritis of knee: Secondary | ICD-10-CM | POA: Diagnosis not present

## 2015-08-27 DIAGNOSIS — I10 Essential (primary) hypertension: Secondary | ICD-10-CM | POA: Diagnosis not present

## 2015-08-27 DIAGNOSIS — I872 Venous insufficiency (chronic) (peripheral): Secondary | ICD-10-CM | POA: Diagnosis not present

## 2015-08-31 DIAGNOSIS — M170 Bilateral primary osteoarthritis of knee: Secondary | ICD-10-CM | POA: Diagnosis not present

## 2015-08-31 DIAGNOSIS — R2689 Other abnormalities of gait and mobility: Secondary | ICD-10-CM | POA: Diagnosis not present

## 2015-08-31 DIAGNOSIS — R54 Age-related physical debility: Secondary | ICD-10-CM | POA: Diagnosis not present

## 2015-08-31 DIAGNOSIS — I10 Essential (primary) hypertension: Secondary | ICD-10-CM | POA: Diagnosis not present

## 2015-08-31 DIAGNOSIS — I872 Venous insufficiency (chronic) (peripheral): Secondary | ICD-10-CM | POA: Diagnosis not present

## 2015-08-31 DIAGNOSIS — E119 Type 2 diabetes mellitus without complications: Secondary | ICD-10-CM | POA: Diagnosis not present

## 2015-09-01 DIAGNOSIS — I872 Venous insufficiency (chronic) (peripheral): Secondary | ICD-10-CM | POA: Diagnosis not present

## 2015-09-01 DIAGNOSIS — R2689 Other abnormalities of gait and mobility: Secondary | ICD-10-CM | POA: Diagnosis not present

## 2015-09-01 DIAGNOSIS — M170 Bilateral primary osteoarthritis of knee: Secondary | ICD-10-CM | POA: Diagnosis not present

## 2015-09-01 DIAGNOSIS — R54 Age-related physical debility: Secondary | ICD-10-CM | POA: Diagnosis not present

## 2015-09-01 DIAGNOSIS — I10 Essential (primary) hypertension: Secondary | ICD-10-CM | POA: Diagnosis not present

## 2015-09-01 DIAGNOSIS — E119 Type 2 diabetes mellitus without complications: Secondary | ICD-10-CM | POA: Diagnosis not present

## 2015-09-03 DIAGNOSIS — E119 Type 2 diabetes mellitus without complications: Secondary | ICD-10-CM | POA: Diagnosis not present

## 2015-09-03 DIAGNOSIS — I872 Venous insufficiency (chronic) (peripheral): Secondary | ICD-10-CM | POA: Diagnosis not present

## 2015-09-03 DIAGNOSIS — R54 Age-related physical debility: Secondary | ICD-10-CM | POA: Diagnosis not present

## 2015-09-03 DIAGNOSIS — M170 Bilateral primary osteoarthritis of knee: Secondary | ICD-10-CM | POA: Diagnosis not present

## 2015-09-03 DIAGNOSIS — I10 Essential (primary) hypertension: Secondary | ICD-10-CM | POA: Diagnosis not present

## 2015-09-03 DIAGNOSIS — R2689 Other abnormalities of gait and mobility: Secondary | ICD-10-CM | POA: Diagnosis not present

## 2015-09-06 DIAGNOSIS — R2689 Other abnormalities of gait and mobility: Secondary | ICD-10-CM | POA: Diagnosis not present

## 2015-09-06 DIAGNOSIS — M170 Bilateral primary osteoarthritis of knee: Secondary | ICD-10-CM | POA: Diagnosis not present

## 2015-09-06 DIAGNOSIS — I872 Venous insufficiency (chronic) (peripheral): Secondary | ICD-10-CM | POA: Diagnosis not present

## 2015-09-06 DIAGNOSIS — R54 Age-related physical debility: Secondary | ICD-10-CM | POA: Diagnosis not present

## 2015-09-06 DIAGNOSIS — E119 Type 2 diabetes mellitus without complications: Secondary | ICD-10-CM | POA: Diagnosis not present

## 2015-09-06 DIAGNOSIS — I10 Essential (primary) hypertension: Secondary | ICD-10-CM | POA: Diagnosis not present

## 2015-09-07 DIAGNOSIS — E119 Type 2 diabetes mellitus without complications: Secondary | ICD-10-CM | POA: Diagnosis not present

## 2015-09-07 DIAGNOSIS — I10 Essential (primary) hypertension: Secondary | ICD-10-CM | POA: Diagnosis not present

## 2015-09-07 DIAGNOSIS — R54 Age-related physical debility: Secondary | ICD-10-CM | POA: Diagnosis not present

## 2015-09-07 DIAGNOSIS — M170 Bilateral primary osteoarthritis of knee: Secondary | ICD-10-CM | POA: Diagnosis not present

## 2015-09-07 DIAGNOSIS — I872 Venous insufficiency (chronic) (peripheral): Secondary | ICD-10-CM | POA: Diagnosis not present

## 2015-09-07 DIAGNOSIS — R2689 Other abnormalities of gait and mobility: Secondary | ICD-10-CM | POA: Diagnosis not present

## 2015-09-08 DIAGNOSIS — M19012 Primary osteoarthritis, left shoulder: Secondary | ICD-10-CM | POA: Diagnosis not present

## 2015-09-08 DIAGNOSIS — M25512 Pain in left shoulder: Secondary | ICD-10-CM | POA: Diagnosis not present

## 2015-09-08 DIAGNOSIS — M25612 Stiffness of left shoulder, not elsewhere classified: Secondary | ICD-10-CM | POA: Diagnosis not present

## 2015-09-09 DIAGNOSIS — I872 Venous insufficiency (chronic) (peripheral): Secondary | ICD-10-CM | POA: Diagnosis not present

## 2015-09-09 DIAGNOSIS — E119 Type 2 diabetes mellitus without complications: Secondary | ICD-10-CM | POA: Diagnosis not present

## 2015-09-09 DIAGNOSIS — I10 Essential (primary) hypertension: Secondary | ICD-10-CM | POA: Diagnosis not present

## 2015-09-09 DIAGNOSIS — R2689 Other abnormalities of gait and mobility: Secondary | ICD-10-CM | POA: Diagnosis not present

## 2015-09-09 DIAGNOSIS — M170 Bilateral primary osteoarthritis of knee: Secondary | ICD-10-CM | POA: Diagnosis not present

## 2015-09-09 DIAGNOSIS — R54 Age-related physical debility: Secondary | ICD-10-CM | POA: Diagnosis not present

## 2015-09-10 DIAGNOSIS — R54 Age-related physical debility: Secondary | ICD-10-CM | POA: Diagnosis not present

## 2015-09-10 DIAGNOSIS — E119 Type 2 diabetes mellitus without complications: Secondary | ICD-10-CM | POA: Diagnosis not present

## 2015-09-10 DIAGNOSIS — M170 Bilateral primary osteoarthritis of knee: Secondary | ICD-10-CM | POA: Diagnosis not present

## 2015-09-10 DIAGNOSIS — I872 Venous insufficiency (chronic) (peripheral): Secondary | ICD-10-CM | POA: Diagnosis not present

## 2015-09-10 DIAGNOSIS — R2689 Other abnormalities of gait and mobility: Secondary | ICD-10-CM | POA: Diagnosis not present

## 2015-09-10 DIAGNOSIS — I10 Essential (primary) hypertension: Secondary | ICD-10-CM | POA: Diagnosis not present

## 2015-09-13 DIAGNOSIS — I10 Essential (primary) hypertension: Secondary | ICD-10-CM | POA: Diagnosis not present

## 2015-09-13 DIAGNOSIS — R2689 Other abnormalities of gait and mobility: Secondary | ICD-10-CM | POA: Diagnosis not present

## 2015-09-13 DIAGNOSIS — I872 Venous insufficiency (chronic) (peripheral): Secondary | ICD-10-CM | POA: Diagnosis not present

## 2015-09-13 DIAGNOSIS — R54 Age-related physical debility: Secondary | ICD-10-CM | POA: Diagnosis not present

## 2015-09-13 DIAGNOSIS — M170 Bilateral primary osteoarthritis of knee: Secondary | ICD-10-CM | POA: Diagnosis not present

## 2015-09-13 DIAGNOSIS — E119 Type 2 diabetes mellitus without complications: Secondary | ICD-10-CM | POA: Diagnosis not present

## 2015-09-15 DIAGNOSIS — E119 Type 2 diabetes mellitus without complications: Secondary | ICD-10-CM | POA: Diagnosis not present

## 2015-09-15 DIAGNOSIS — I10 Essential (primary) hypertension: Secondary | ICD-10-CM | POA: Diagnosis not present

## 2015-09-15 DIAGNOSIS — I872 Venous insufficiency (chronic) (peripheral): Secondary | ICD-10-CM | POA: Diagnosis not present

## 2015-09-15 DIAGNOSIS — R54 Age-related physical debility: Secondary | ICD-10-CM | POA: Diagnosis not present

## 2015-09-15 DIAGNOSIS — M170 Bilateral primary osteoarthritis of knee: Secondary | ICD-10-CM | POA: Diagnosis not present

## 2015-09-15 DIAGNOSIS — R2689 Other abnormalities of gait and mobility: Secondary | ICD-10-CM | POA: Diagnosis not present

## 2015-09-16 DIAGNOSIS — I10 Essential (primary) hypertension: Secondary | ICD-10-CM | POA: Diagnosis not present

## 2015-09-16 DIAGNOSIS — I872 Venous insufficiency (chronic) (peripheral): Secondary | ICD-10-CM | POA: Diagnosis not present

## 2015-09-16 DIAGNOSIS — M170 Bilateral primary osteoarthritis of knee: Secondary | ICD-10-CM | POA: Diagnosis not present

## 2015-09-16 DIAGNOSIS — R54 Age-related physical debility: Secondary | ICD-10-CM | POA: Diagnosis not present

## 2015-09-16 DIAGNOSIS — E119 Type 2 diabetes mellitus without complications: Secondary | ICD-10-CM | POA: Diagnosis not present

## 2015-09-16 DIAGNOSIS — R2689 Other abnormalities of gait and mobility: Secondary | ICD-10-CM | POA: Diagnosis not present

## 2015-09-17 DIAGNOSIS — I872 Venous insufficiency (chronic) (peripheral): Secondary | ICD-10-CM | POA: Diagnosis not present

## 2015-09-17 DIAGNOSIS — I10 Essential (primary) hypertension: Secondary | ICD-10-CM | POA: Diagnosis not present

## 2015-09-17 DIAGNOSIS — M170 Bilateral primary osteoarthritis of knee: Secondary | ICD-10-CM | POA: Diagnosis not present

## 2015-09-17 DIAGNOSIS — R2689 Other abnormalities of gait and mobility: Secondary | ICD-10-CM | POA: Diagnosis not present

## 2015-09-17 DIAGNOSIS — R54 Age-related physical debility: Secondary | ICD-10-CM | POA: Diagnosis not present

## 2015-09-17 DIAGNOSIS — E119 Type 2 diabetes mellitus without complications: Secondary | ICD-10-CM | POA: Diagnosis not present

## 2015-09-21 DIAGNOSIS — R54 Age-related physical debility: Secondary | ICD-10-CM | POA: Diagnosis not present

## 2015-09-21 DIAGNOSIS — I10 Essential (primary) hypertension: Secondary | ICD-10-CM | POA: Diagnosis not present

## 2015-09-21 DIAGNOSIS — I872 Venous insufficiency (chronic) (peripheral): Secondary | ICD-10-CM | POA: Diagnosis not present

## 2015-09-21 DIAGNOSIS — E119 Type 2 diabetes mellitus without complications: Secondary | ICD-10-CM | POA: Diagnosis not present

## 2015-09-21 DIAGNOSIS — R2689 Other abnormalities of gait and mobility: Secondary | ICD-10-CM | POA: Diagnosis not present

## 2015-09-21 DIAGNOSIS — M170 Bilateral primary osteoarthritis of knee: Secondary | ICD-10-CM | POA: Diagnosis not present

## 2015-09-22 DIAGNOSIS — R54 Age-related physical debility: Secondary | ICD-10-CM | POA: Diagnosis not present

## 2015-09-22 DIAGNOSIS — M170 Bilateral primary osteoarthritis of knee: Secondary | ICD-10-CM | POA: Diagnosis not present

## 2015-09-22 DIAGNOSIS — E119 Type 2 diabetes mellitus without complications: Secondary | ICD-10-CM | POA: Diagnosis not present

## 2015-09-22 DIAGNOSIS — I872 Venous insufficiency (chronic) (peripheral): Secondary | ICD-10-CM | POA: Diagnosis not present

## 2015-09-22 DIAGNOSIS — I10 Essential (primary) hypertension: Secondary | ICD-10-CM | POA: Diagnosis not present

## 2015-09-22 DIAGNOSIS — R2689 Other abnormalities of gait and mobility: Secondary | ICD-10-CM | POA: Diagnosis not present

## 2015-09-23 DIAGNOSIS — I10 Essential (primary) hypertension: Secondary | ICD-10-CM | POA: Diagnosis not present

## 2015-09-23 DIAGNOSIS — E119 Type 2 diabetes mellitus without complications: Secondary | ICD-10-CM | POA: Diagnosis not present

## 2015-09-23 DIAGNOSIS — R2689 Other abnormalities of gait and mobility: Secondary | ICD-10-CM | POA: Diagnosis not present

## 2015-09-23 DIAGNOSIS — R54 Age-related physical debility: Secondary | ICD-10-CM | POA: Diagnosis not present

## 2015-09-23 DIAGNOSIS — M170 Bilateral primary osteoarthritis of knee: Secondary | ICD-10-CM | POA: Diagnosis not present

## 2015-09-23 DIAGNOSIS — I872 Venous insufficiency (chronic) (peripheral): Secondary | ICD-10-CM | POA: Diagnosis not present

## 2015-09-27 DIAGNOSIS — M170 Bilateral primary osteoarthritis of knee: Secondary | ICD-10-CM | POA: Diagnosis not present

## 2015-09-27 DIAGNOSIS — I872 Venous insufficiency (chronic) (peripheral): Secondary | ICD-10-CM | POA: Diagnosis not present

## 2015-09-27 DIAGNOSIS — E119 Type 2 diabetes mellitus without complications: Secondary | ICD-10-CM | POA: Diagnosis not present

## 2015-09-27 DIAGNOSIS — R2689 Other abnormalities of gait and mobility: Secondary | ICD-10-CM | POA: Diagnosis not present

## 2015-09-27 DIAGNOSIS — I10 Essential (primary) hypertension: Secondary | ICD-10-CM | POA: Diagnosis not present

## 2015-09-27 DIAGNOSIS — R54 Age-related physical debility: Secondary | ICD-10-CM | POA: Diagnosis not present

## 2015-09-28 DIAGNOSIS — M170 Bilateral primary osteoarthritis of knee: Secondary | ICD-10-CM | POA: Diagnosis not present

## 2015-09-28 DIAGNOSIS — I872 Venous insufficiency (chronic) (peripheral): Secondary | ICD-10-CM | POA: Diagnosis not present

## 2015-09-28 DIAGNOSIS — R54 Age-related physical debility: Secondary | ICD-10-CM | POA: Diagnosis not present

## 2015-09-28 DIAGNOSIS — E119 Type 2 diabetes mellitus without complications: Secondary | ICD-10-CM | POA: Diagnosis not present

## 2015-09-28 DIAGNOSIS — R2689 Other abnormalities of gait and mobility: Secondary | ICD-10-CM | POA: Diagnosis not present

## 2015-09-28 DIAGNOSIS — I10 Essential (primary) hypertension: Secondary | ICD-10-CM | POA: Diagnosis not present

## 2015-09-29 DIAGNOSIS — I872 Venous insufficiency (chronic) (peripheral): Secondary | ICD-10-CM | POA: Diagnosis not present

## 2015-09-29 DIAGNOSIS — R2689 Other abnormalities of gait and mobility: Secondary | ICD-10-CM | POA: Diagnosis not present

## 2015-09-29 DIAGNOSIS — E119 Type 2 diabetes mellitus without complications: Secondary | ICD-10-CM | POA: Diagnosis not present

## 2015-09-29 DIAGNOSIS — R54 Age-related physical debility: Secondary | ICD-10-CM | POA: Diagnosis not present

## 2015-09-29 DIAGNOSIS — M170 Bilateral primary osteoarthritis of knee: Secondary | ICD-10-CM | POA: Diagnosis not present

## 2015-09-29 DIAGNOSIS — I10 Essential (primary) hypertension: Secondary | ICD-10-CM | POA: Diagnosis not present

## 2015-10-05 DIAGNOSIS — M25612 Stiffness of left shoulder, not elsewhere classified: Secondary | ICD-10-CM | POA: Diagnosis not present

## 2015-10-05 DIAGNOSIS — M19012 Primary osteoarthritis, left shoulder: Secondary | ICD-10-CM | POA: Diagnosis not present

## 2015-10-05 DIAGNOSIS — M25512 Pain in left shoulder: Secondary | ICD-10-CM | POA: Diagnosis not present

## 2015-11-29 DIAGNOSIS — L509 Urticaria, unspecified: Secondary | ICD-10-CM | POA: Diagnosis not present

## 2015-11-29 DIAGNOSIS — Z1231 Encounter for screening mammogram for malignant neoplasm of breast: Secondary | ICD-10-CM | POA: Diagnosis not present

## 2015-11-29 DIAGNOSIS — I10 Essential (primary) hypertension: Secondary | ICD-10-CM | POA: Diagnosis not present

## 2015-11-29 DIAGNOSIS — Z6841 Body Mass Index (BMI) 40.0 and over, adult: Secondary | ICD-10-CM | POA: Diagnosis not present

## 2015-11-29 DIAGNOSIS — M19012 Primary osteoarthritis, left shoulder: Secondary | ICD-10-CM | POA: Diagnosis not present

## 2015-11-29 DIAGNOSIS — E119 Type 2 diabetes mellitus without complications: Secondary | ICD-10-CM | POA: Diagnosis not present

## 2015-11-29 DIAGNOSIS — E785 Hyperlipidemia, unspecified: Secondary | ICD-10-CM | POA: Diagnosis not present

## 2015-11-29 DIAGNOSIS — F329 Major depressive disorder, single episode, unspecified: Secondary | ICD-10-CM | POA: Diagnosis not present

## 2015-12-02 DIAGNOSIS — M6281 Muscle weakness (generalized): Secondary | ICD-10-CM | POA: Diagnosis not present

## 2015-12-02 DIAGNOSIS — F329 Major depressive disorder, single episode, unspecified: Secondary | ICD-10-CM | POA: Diagnosis not present

## 2015-12-02 DIAGNOSIS — R2689 Other abnormalities of gait and mobility: Secondary | ICD-10-CM | POA: Diagnosis not present

## 2015-12-02 DIAGNOSIS — E119 Type 2 diabetes mellitus without complications: Secondary | ICD-10-CM | POA: Diagnosis not present

## 2015-12-02 DIAGNOSIS — I10 Essential (primary) hypertension: Secondary | ICD-10-CM | POA: Diagnosis not present

## 2015-12-02 DIAGNOSIS — Z9181 History of falling: Secondary | ICD-10-CM | POA: Diagnosis not present

## 2015-12-02 DIAGNOSIS — M19012 Primary osteoarthritis, left shoulder: Secondary | ICD-10-CM | POA: Diagnosis not present

## 2015-12-06 DIAGNOSIS — E119 Type 2 diabetes mellitus without complications: Secondary | ICD-10-CM | POA: Diagnosis not present

## 2015-12-06 DIAGNOSIS — M6281 Muscle weakness (generalized): Secondary | ICD-10-CM | POA: Diagnosis not present

## 2015-12-06 DIAGNOSIS — F329 Major depressive disorder, single episode, unspecified: Secondary | ICD-10-CM | POA: Diagnosis not present

## 2015-12-06 DIAGNOSIS — R2689 Other abnormalities of gait and mobility: Secondary | ICD-10-CM | POA: Diagnosis not present

## 2015-12-06 DIAGNOSIS — M19012 Primary osteoarthritis, left shoulder: Secondary | ICD-10-CM | POA: Diagnosis not present

## 2015-12-06 DIAGNOSIS — I10 Essential (primary) hypertension: Secondary | ICD-10-CM | POA: Diagnosis not present

## 2015-12-07 DIAGNOSIS — M19012 Primary osteoarthritis, left shoulder: Secondary | ICD-10-CM | POA: Diagnosis not present

## 2015-12-07 DIAGNOSIS — F329 Major depressive disorder, single episode, unspecified: Secondary | ICD-10-CM | POA: Diagnosis not present

## 2015-12-07 DIAGNOSIS — M6281 Muscle weakness (generalized): Secondary | ICD-10-CM | POA: Diagnosis not present

## 2015-12-07 DIAGNOSIS — R2689 Other abnormalities of gait and mobility: Secondary | ICD-10-CM | POA: Diagnosis not present

## 2015-12-07 DIAGNOSIS — E119 Type 2 diabetes mellitus without complications: Secondary | ICD-10-CM | POA: Diagnosis not present

## 2015-12-07 DIAGNOSIS — I10 Essential (primary) hypertension: Secondary | ICD-10-CM | POA: Diagnosis not present

## 2015-12-08 DIAGNOSIS — E119 Type 2 diabetes mellitus without complications: Secondary | ICD-10-CM | POA: Diagnosis not present

## 2015-12-08 DIAGNOSIS — M19012 Primary osteoarthritis, left shoulder: Secondary | ICD-10-CM | POA: Diagnosis not present

## 2015-12-08 DIAGNOSIS — F329 Major depressive disorder, single episode, unspecified: Secondary | ICD-10-CM | POA: Diagnosis not present

## 2015-12-08 DIAGNOSIS — I10 Essential (primary) hypertension: Secondary | ICD-10-CM | POA: Diagnosis not present

## 2015-12-08 DIAGNOSIS — M6281 Muscle weakness (generalized): Secondary | ICD-10-CM | POA: Diagnosis not present

## 2015-12-08 DIAGNOSIS — R2689 Other abnormalities of gait and mobility: Secondary | ICD-10-CM | POA: Diagnosis not present

## 2015-12-10 DIAGNOSIS — I10 Essential (primary) hypertension: Secondary | ICD-10-CM | POA: Diagnosis not present

## 2015-12-10 DIAGNOSIS — M6281 Muscle weakness (generalized): Secondary | ICD-10-CM | POA: Diagnosis not present

## 2015-12-10 DIAGNOSIS — E119 Type 2 diabetes mellitus without complications: Secondary | ICD-10-CM | POA: Diagnosis not present

## 2015-12-10 DIAGNOSIS — R2689 Other abnormalities of gait and mobility: Secondary | ICD-10-CM | POA: Diagnosis not present

## 2015-12-10 DIAGNOSIS — F329 Major depressive disorder, single episode, unspecified: Secondary | ICD-10-CM | POA: Diagnosis not present

## 2015-12-10 DIAGNOSIS — M19012 Primary osteoarthritis, left shoulder: Secondary | ICD-10-CM | POA: Diagnosis not present

## 2015-12-13 DIAGNOSIS — M19012 Primary osteoarthritis, left shoulder: Secondary | ICD-10-CM | POA: Diagnosis not present

## 2015-12-13 DIAGNOSIS — E119 Type 2 diabetes mellitus without complications: Secondary | ICD-10-CM | POA: Diagnosis not present

## 2015-12-13 DIAGNOSIS — M6281 Muscle weakness (generalized): Secondary | ICD-10-CM | POA: Diagnosis not present

## 2015-12-13 DIAGNOSIS — F329 Major depressive disorder, single episode, unspecified: Secondary | ICD-10-CM | POA: Diagnosis not present

## 2015-12-13 DIAGNOSIS — R2689 Other abnormalities of gait and mobility: Secondary | ICD-10-CM | POA: Diagnosis not present

## 2015-12-13 DIAGNOSIS — I10 Essential (primary) hypertension: Secondary | ICD-10-CM | POA: Diagnosis not present

## 2015-12-14 DIAGNOSIS — M6281 Muscle weakness (generalized): Secondary | ICD-10-CM | POA: Diagnosis not present

## 2015-12-14 DIAGNOSIS — M19012 Primary osteoarthritis, left shoulder: Secondary | ICD-10-CM | POA: Diagnosis not present

## 2015-12-14 DIAGNOSIS — I10 Essential (primary) hypertension: Secondary | ICD-10-CM | POA: Diagnosis not present

## 2015-12-14 DIAGNOSIS — F329 Major depressive disorder, single episode, unspecified: Secondary | ICD-10-CM | POA: Diagnosis not present

## 2015-12-14 DIAGNOSIS — R2689 Other abnormalities of gait and mobility: Secondary | ICD-10-CM | POA: Diagnosis not present

## 2015-12-14 DIAGNOSIS — E119 Type 2 diabetes mellitus without complications: Secondary | ICD-10-CM | POA: Diagnosis not present

## 2015-12-16 DIAGNOSIS — R2689 Other abnormalities of gait and mobility: Secondary | ICD-10-CM | POA: Diagnosis not present

## 2015-12-16 DIAGNOSIS — I10 Essential (primary) hypertension: Secondary | ICD-10-CM | POA: Diagnosis not present

## 2015-12-16 DIAGNOSIS — F329 Major depressive disorder, single episode, unspecified: Secondary | ICD-10-CM | POA: Diagnosis not present

## 2015-12-16 DIAGNOSIS — M19012 Primary osteoarthritis, left shoulder: Secondary | ICD-10-CM | POA: Diagnosis not present

## 2015-12-16 DIAGNOSIS — E119 Type 2 diabetes mellitus without complications: Secondary | ICD-10-CM | POA: Diagnosis not present

## 2015-12-16 DIAGNOSIS — M6281 Muscle weakness (generalized): Secondary | ICD-10-CM | POA: Diagnosis not present

## 2015-12-17 DIAGNOSIS — M19012 Primary osteoarthritis, left shoulder: Secondary | ICD-10-CM | POA: Diagnosis not present

## 2015-12-17 DIAGNOSIS — R2689 Other abnormalities of gait and mobility: Secondary | ICD-10-CM | POA: Diagnosis not present

## 2015-12-17 DIAGNOSIS — M6281 Muscle weakness (generalized): Secondary | ICD-10-CM | POA: Diagnosis not present

## 2015-12-17 DIAGNOSIS — E119 Type 2 diabetes mellitus without complications: Secondary | ICD-10-CM | POA: Diagnosis not present

## 2015-12-17 DIAGNOSIS — I10 Essential (primary) hypertension: Secondary | ICD-10-CM | POA: Diagnosis not present

## 2015-12-17 DIAGNOSIS — F329 Major depressive disorder, single episode, unspecified: Secondary | ICD-10-CM | POA: Diagnosis not present

## 2015-12-20 DIAGNOSIS — M6281 Muscle weakness (generalized): Secondary | ICD-10-CM | POA: Diagnosis not present

## 2015-12-20 DIAGNOSIS — R2689 Other abnormalities of gait and mobility: Secondary | ICD-10-CM | POA: Diagnosis not present

## 2015-12-20 DIAGNOSIS — F329 Major depressive disorder, single episode, unspecified: Secondary | ICD-10-CM | POA: Diagnosis not present

## 2015-12-20 DIAGNOSIS — I10 Essential (primary) hypertension: Secondary | ICD-10-CM | POA: Diagnosis not present

## 2015-12-20 DIAGNOSIS — E119 Type 2 diabetes mellitus without complications: Secondary | ICD-10-CM | POA: Diagnosis not present

## 2015-12-20 DIAGNOSIS — M19012 Primary osteoarthritis, left shoulder: Secondary | ICD-10-CM | POA: Diagnosis not present

## 2015-12-21 ENCOUNTER — Emergency Department (HOSPITAL_COMMUNITY): Payer: Medicare Other

## 2015-12-21 ENCOUNTER — Inpatient Hospital Stay (HOSPITAL_COMMUNITY)
Admission: EM | Admit: 2015-12-21 | Discharge: 2015-12-24 | DRG: 194 | Disposition: A | Discharge status: Home Health | Payer: Medicare Other | Attending: Internal Medicine | Admitting: Internal Medicine

## 2015-12-21 DIAGNOSIS — M25519 Pain in unspecified shoulder: Secondary | ICD-10-CM

## 2015-12-21 DIAGNOSIS — E860 Dehydration: Secondary | ICD-10-CM | POA: Diagnosis present

## 2015-12-21 DIAGNOSIS — F419 Anxiety disorder, unspecified: Secondary | ICD-10-CM | POA: Diagnosis not present

## 2015-12-21 DIAGNOSIS — I493 Ventricular premature depolarization: Secondary | ICD-10-CM | POA: Diagnosis present

## 2015-12-21 DIAGNOSIS — M170 Bilateral primary osteoarthritis of knee: Secondary | ICD-10-CM | POA: Diagnosis not present

## 2015-12-21 DIAGNOSIS — I959 Hypotension, unspecified: Secondary | ICD-10-CM | POA: Diagnosis not present

## 2015-12-21 DIAGNOSIS — M19012 Primary osteoarthritis, left shoulder: Secondary | ICD-10-CM | POA: Diagnosis present

## 2015-12-21 DIAGNOSIS — I129 Hypertensive chronic kidney disease with stage 1 through stage 4 chronic kidney disease, or unspecified chronic kidney disease: Secondary | ICD-10-CM | POA: Diagnosis present

## 2015-12-21 DIAGNOSIS — Z79899 Other long term (current) drug therapy: Secondary | ICD-10-CM

## 2015-12-21 DIAGNOSIS — M25512 Pain in left shoulder: Secondary | ICD-10-CM

## 2015-12-21 DIAGNOSIS — M6281 Muscle weakness (generalized): Secondary | ICD-10-CM | POA: Diagnosis not present

## 2015-12-21 DIAGNOSIS — R079 Chest pain, unspecified: Secondary | ICD-10-CM | POA: Diagnosis not present

## 2015-12-21 DIAGNOSIS — R52 Pain, unspecified: Secondary | ICD-10-CM | POA: Diagnosis not present

## 2015-12-21 DIAGNOSIS — N179 Acute kidney failure, unspecified: Secondary | ICD-10-CM | POA: Diagnosis not present

## 2015-12-21 DIAGNOSIS — J189 Pneumonia, unspecified organism: Principal | ICD-10-CM | POA: Diagnosis present

## 2015-12-21 DIAGNOSIS — N182 Chronic kidney disease, stage 2 (mild): Secondary | ICD-10-CM | POA: Diagnosis present

## 2015-12-21 DIAGNOSIS — R918 Other nonspecific abnormal finding of lung field: Secondary | ICD-10-CM

## 2015-12-21 DIAGNOSIS — Z6841 Body Mass Index (BMI) 40.0 and over, adult: Secondary | ICD-10-CM

## 2015-12-21 DIAGNOSIS — G8929 Other chronic pain: Secondary | ICD-10-CM | POA: Diagnosis not present

## 2015-12-21 DIAGNOSIS — Z8249 Family history of ischemic heart disease and other diseases of the circulatory system: Secondary | ICD-10-CM

## 2015-12-21 DIAGNOSIS — I10 Essential (primary) hypertension: Secondary | ICD-10-CM | POA: Diagnosis not present

## 2015-12-21 DIAGNOSIS — E119 Type 2 diabetes mellitus without complications: Secondary | ICD-10-CM | POA: Diagnosis not present

## 2015-12-21 DIAGNOSIS — R2689 Other abnormalities of gait and mobility: Secondary | ICD-10-CM | POA: Diagnosis not present

## 2015-12-21 DIAGNOSIS — E669 Obesity, unspecified: Secondary | ICD-10-CM | POA: Diagnosis present

## 2015-12-21 DIAGNOSIS — R531 Weakness: Secondary | ICD-10-CM

## 2015-12-21 DIAGNOSIS — F329 Major depressive disorder, single episode, unspecified: Secondary | ICD-10-CM | POA: Diagnosis not present

## 2015-12-21 HISTORY — DX: Pneumonia, unspecified organism: J18.9

## 2015-12-21 HISTORY — DX: Essential (primary) hypertension: I10

## 2015-12-21 LAB — CBC WITH DIFFERENTIAL/PLATELET
BASOS PCT: 0 %
Basophils Absolute: 0 10*3/uL (ref 0.0–0.1)
EOS ABS: 0.4 10*3/uL (ref 0.0–0.7)
EOS PCT: 5 %
HEMATOCRIT: 40.6 % (ref 36.0–46.0)
Hemoglobin: 12.7 g/dL (ref 12.0–15.0)
Lymphocytes Relative: 31 %
Lymphs Abs: 2.2 10*3/uL (ref 0.7–4.0)
MCH: 29.2 pg (ref 26.0–34.0)
MCHC: 31.3 g/dL (ref 30.0–36.0)
MCV: 93.3 fL (ref 78.0–100.0)
MONO ABS: 0.6 10*3/uL (ref 0.1–1.0)
MONOS PCT: 8 %
NEUTROS ABS: 3.9 10*3/uL (ref 1.7–7.7)
Neutrophils Relative %: 56 %
PLATELETS: 284 10*3/uL (ref 150–400)
RBC: 4.35 MIL/uL (ref 3.87–5.11)
RDW: 14.2 % (ref 11.5–15.5)
WBC: 7 10*3/uL (ref 4.0–10.5)

## 2015-12-21 LAB — COMPREHENSIVE METABOLIC PANEL
ALK PHOS: 68 U/L (ref 38–126)
ALT: 12 U/L — AB (ref 14–54)
ANION GAP: 7 (ref 5–15)
AST: 18 U/L (ref 15–41)
Albumin: 3.4 g/dL — ABNORMAL LOW (ref 3.5–5.0)
BILIRUBIN TOTAL: 0.7 mg/dL (ref 0.3–1.2)
BUN: 16 mg/dL (ref 6–20)
CALCIUM: 9.1 mg/dL (ref 8.9–10.3)
CO2: 25 mmol/L (ref 22–32)
CREATININE: 1.23 mg/dL — AB (ref 0.44–1.00)
Chloride: 104 mmol/L (ref 101–111)
GFR, EST AFRICAN AMERICAN: 51 mL/min — AB (ref >60)
GFR, EST NON AFRICAN AMERICAN: 44 mL/min — AB (ref >60)
Glucose, Bld: 106 mg/dL — ABNORMAL HIGH (ref 65–99)
Potassium: 3.5 mmol/L (ref 3.5–5.1)
SODIUM: 136 mmol/L (ref 135–145)
TOTAL PROTEIN: 6.6 g/dL (ref 6.5–8.1)

## 2015-12-21 LAB — I-STAT TROPONIN, ED: Troponin i, poc: 0.01 ng/mL (ref 0.00–0.08)

## 2015-12-21 MED ORDER — SODIUM CHLORIDE 0.9 % IV BOLUS (SEPSIS)
500.0000 mL | Freq: Once | INTRAVENOUS | Status: AC
  Administered 2015-12-21: 500 mL via INTRAVENOUS

## 2015-12-21 MED ORDER — DEXTROSE 5 % IV SOLN
1.0000 g | Freq: Once | INTRAVENOUS | Status: AC
  Administered 2015-12-22: 1 g via INTRAVENOUS
  Filled 2015-12-21: qty 10

## 2015-12-21 MED ORDER — DEXTROSE 5 % IV SOLN
500.0000 mg | Freq: Once | INTRAVENOUS | Status: AC
  Administered 2015-12-22: 500 mg via INTRAVENOUS
  Filled 2015-12-21: qty 500

## 2015-12-21 MED ORDER — FENTANYL CITRATE (PF) 100 MCG/2ML IJ SOLN
50.0000 ug | Freq: Once | INTRAMUSCULAR | Status: AC
  Administered 2015-12-21: 50 ug via INTRAVENOUS
  Filled 2015-12-21: qty 2

## 2015-12-21 NOTE — ED Triage Notes (Signed)
Pt arrives from home via [facility] reporting L shoul.der pain radiating to neck, chronic but worse x 2 days.  Pt reports dizziness, lightheaded on exertion.  EMS reports NSR with 20-30 PVC's per minute.  PT AOx4 on arrival, reports pain 10/10.

## 2015-12-21 NOTE — ED Provider Notes (Signed)
[facility] DEPT Provider Note   CSN: [id number] Arrival date & time: 12/21/15  1927     History   Chief Complaint Chief Complaint  Patient presents with  . Shoulder Pain    HPI Cheryl Lane is a 65 y.o. female.  Patient is a 65 year old female who presents with fatigue and left shoulder pain. She has some chronic pain in her left shoulder and has limited mobility in her left shoulder. She states over the last several days she's had worsening pain in her left shoulder. She states it feels different than her prior episodes of pain. It radiates to her left neck and down her left arm a little bit. She denies any numbness or weakness in the arm. She's followed by an orthopedist though. She's had prior injections of the shoulder. She states the pain is continued to worsen over the last 2-3 days. She's also had some increase generalized weakness and fatigue. She states at times she feels like she can't hold her head up. There is no fevers. She has some mild shortness of breath. No chest pain. Her home health nurse as well as the family had noted that she's had episodes of bradycardia with heart rates down into the 30s. This was confirmed by the home health nurse. One reading from today showed some mild associated hypotension with a blood pressure in [id number] systolic. She denies any fevers cough colds symptoms or other recent illnesses. No urinary symptoms. She was seen at [facility] last night. I reviewed the records that the daughter has. She had a neck strain of the shoulder which showed degenerative joint disease but otherwise was unremarkable. She had lab work which showed an elevated creatinine but otherwise was unremarkable. Her urinalysis did not show evidence of a UTI. Her symptoms got worse today and she was brought here for further evaluation. She states today she's been having episodes where she feels like her heart is slowing down and she feels like she might pass out and then her  heart starts racing.    Shoulder Pain   Pertinent negatives include no numbness.    No past medical history on file.  Patient Active Problem List   Diagnosis Date Noted  . CAP (community acquired pneumonia) 12/21/2015    No past surgical history on file.  OB History    No data available       Home Medications    Prior to Admission medications   Not on File    Family History No family history on file.  Social History Social History  Substance Use Topics  . Smoking status: Not on file  . Smokeless tobacco: Not on file  . Alcohol use Not on file     Allergies   Review of patient's allergies indicates not on file.   Review of Systems Review of Systems  Constitutional: Positive for appetite change and fatigue. Negative for chills, diaphoresis and fever.  HENT: Negative for congestion, rhinorrhea and sneezing.   Eyes: Negative.   Respiratory: Positive for shortness of breath. Negative for cough and chest tightness.   Cardiovascular: Positive for palpitations. Negative for chest pain and leg swelling.  Gastrointestinal: Negative for abdominal pain, blood in stool, diarrhea, nausea and vomiting.  Genitourinary: Negative for difficulty urinating, flank pain, frequency and hematuria.  Musculoskeletal: Positive for arthralgias and joint swelling. Negative for back pain and neck pain.  Skin: Negative for rash and wound.  Neurological: Negative for dizziness, speech difficulty, weakness, numbness and headaches.  Physical Exam Updated Vital Signs BP 111/76   Pulse 80   Resp 22   SpO2 98%   Physical Exam  Constitutional: She is oriented to person, place, and time. She appears well-developed and well-nourished.  HENT:  Head: Normocephalic and atraumatic.  Eyes: Pupils are equal, round, and reactive to light.  Neck: Normal range of motion. Neck supple.  Cardiovascular: Normal rate, regular rhythm and normal heart sounds.   Pulmonary/Chest: Effort normal and  breath sounds normal. No respiratory distress. She has no wheezes. She has no rales. She exhibits no tenderness.  Abdominal: Soft. Bowel sounds are normal. There is no tenderness. There is no rebound and no guarding.  Musculoskeletal: Normal range of motion. She exhibits no edema.  Patient has tenderness on palpation of the left shoulder. There is no warmth or erythema. No noticeable swelling. She has limited range of motion of the shoulder which is baseline per her daughter. She has pain with range of motion of the shoulder. There is also tenderness over the left trapezius muscle. She has normal sensation and motor function in the lower arm. Radial pulses are intact.  Lymphadenopathy:    She has no cervical adenopathy.  Neurological: She is alert and oriented to person, place, and time.  Skin: Skin is warm and dry. No rash noted.  Psychiatric: She has a normal mood and affect.     ED Treatments / Results  Labs (all labs ordered are listed, but only abnormal results are displayed) Labs Reviewed  COMPREHENSIVE METABOLIC PANEL - Abnormal; Notable for the following:       Result Value   Glucose, Bld 106 (*)    Creatinine, Ser 1.23 (*)    Albumin 3.4 (*)    ALT 12 (*)    GFR calc non Af Amer 44 (*)    GFR calc Af Amer 51 (*)    All other components within normal limits  CULTURE, BLOOD (ROUTINE X 2)  CULTURE, BLOOD (ROUTINE X 2)  CBC WITH DIFFERENTIAL/PLATELET  I-STAT TROPOININ, ED  I-STAT CG4 LACTIC ACID, ED    EKG  EKG Interpretation  Date/Time:  Tuesday December 21 2015 19:37:06 EDT Ventricular Rate:  81 PR Interval:    QRS Duration: 107 QT Interval:  409 QTC Calculation: 475 R Axis:   -39 Text Interpretation:  Sinus rhythm Multiform ventricular premature complexes Probable left atrial enlargement Low voltage, precordial leads Abnormal R-wave progression, early transition Left ventricular hypertrophy Anterior Q waves, possibly due to LVH since last tracing no significant change  Confirmed by Kastiel Simonian  MD, Darcy Barbara ([id number]) on 12/21/2015 7:41:58 PM       Radiology Dg Chest 2 View  Result Date: 12/21/2015 CLINICAL DATA:  Left arm and chest pain. Low heart rate. Back pain. Symptoms for 3 days. Seen at [facility] yesterday for similar symptoms. EXAM: CHEST  2 VIEW COMPARISON:  None. FINDINGS: Shallow inspiration. Normal heart size and pulmonary vascularity. Increased density in the lung base posteriorly suggesting an infiltrate in the left lung base. Possible pneumonia. No blunting of costophrenic angles. No pneumothorax. Mediastinal contours appear intact. Degenerative changes in the spine and shoulders. IMPRESSION: Increased density in the left lung base posteriorly suggests focal pneumonia. Electronically Signed   By: Lucienne Capers M.D.   On: 12/21/2015 22:00    Procedures Procedures (including critical care time)  Medications Ordered in ED Medications  cefTRIAXone (ROCEPHIN) 1 g in dextrose 5 % 50 mL IVPB (not administered)  azithromycin (ZITHROMAX) 500 mg in dextrose 5 %  250 mL IVPB (not administered)  sodium chloride 0.9 % bolus 500 mL (500 mLs Intravenous New Bag/Given 12/21/15 2100)  fentaNYL (SUBLIMAZE) injection 50 mcg (50 mcg Intravenous Given 12/21/15 2100)  sodium chloride 0.9 % bolus 500 mL (500 mLs Intravenous New Bag/Given 12/21/15 2319)     Initial Impression / Assessment and Plan / ED Course  I have reviewed the triage vital signs and the nursing notes.  Pertinent labs & imaging results that were available during my care of the patient were reviewed by me and considered in my medical decision making (see chart for details).  Clinical Course   Patient presents primarily with left shoulder pain. It seems to be musculoskeletal in nature. I don't see any suggestions of septic arthritis. She's neurologically intact. She's feeling a little bit better after a dose of fentanyl. More concerning is her episodes of bradycardia and near syncopal episodes that she's  had at home. She did have an episode of bradycardia with associated hypotension that was verified by the home health nurse. The daughter states that the home health nurse did do a manual pulse check and found her heart rate to be in the 30s. Her chest x-ray also shows evidence of pneumonia. She has no recent hospitalizations. She was started on Rocephin and Zithromax for community-acquired pneumonia. She doesn't have surgical criteria for sepsis. I spoke with Dr. Hal Hope who will admit the patient to a obstacle telemetry bed.  Final Clinical Impressions(s) / ED Diagnoses   Final diagnoses:  Shoulder pain, acute, left  CAP (community acquired pneumonia)    New Prescriptions New Prescriptions   No medications on file     Malvin Johns, MD 12/21/15 2323

## 2015-12-21 NOTE — ED Notes (Addendum)
Kara Dies- daughter- [phone]

## 2015-12-22 ENCOUNTER — Encounter (HOSPITAL_COMMUNITY): Payer: Self-pay | Admitting: *Deleted

## 2015-12-22 ENCOUNTER — Observation Stay (HOSPITAL_COMMUNITY): Payer: Medicare Other

## 2015-12-22 ENCOUNTER — Other Ambulatory Visit: Payer: Self-pay

## 2015-12-22 DIAGNOSIS — Z6841 Body Mass Index (BMI) 40.0 and over, adult: Secondary | ICD-10-CM | POA: Diagnosis not present

## 2015-12-22 DIAGNOSIS — I129 Hypertensive chronic kidney disease with stage 1 through stage 4 chronic kidney disease, or unspecified chronic kidney disease: Secondary | ICD-10-CM | POA: Diagnosis present

## 2015-12-22 DIAGNOSIS — I493 Ventricular premature depolarization: Secondary | ICD-10-CM

## 2015-12-22 DIAGNOSIS — M19012 Primary osteoarthritis, left shoulder: Secondary | ICD-10-CM | POA: Diagnosis not present

## 2015-12-22 DIAGNOSIS — E669 Obesity, unspecified: Secondary | ICD-10-CM | POA: Diagnosis present

## 2015-12-22 DIAGNOSIS — K449 Diaphragmatic hernia without obstruction or gangrene: Secondary | ICD-10-CM | POA: Diagnosis not present

## 2015-12-22 DIAGNOSIS — E860 Dehydration: Secondary | ICD-10-CM | POA: Diagnosis present

## 2015-12-22 DIAGNOSIS — I959 Hypotension, unspecified: Secondary | ICD-10-CM | POA: Diagnosis present

## 2015-12-22 DIAGNOSIS — J189 Pneumonia, unspecified organism: Secondary | ICD-10-CM | POA: Diagnosis present

## 2015-12-22 DIAGNOSIS — N179 Acute kidney failure, unspecified: Secondary | ICD-10-CM | POA: Diagnosis present

## 2015-12-22 DIAGNOSIS — R002 Palpitations: Secondary | ICD-10-CM | POA: Diagnosis not present

## 2015-12-22 DIAGNOSIS — I10 Essential (primary) hypertension: Secondary | ICD-10-CM | POA: Diagnosis not present

## 2015-12-22 DIAGNOSIS — R918 Other nonspecific abnormal finding of lung field: Secondary | ICD-10-CM | POA: Diagnosis not present

## 2015-12-22 DIAGNOSIS — Z79899 Other long term (current) drug therapy: Secondary | ICD-10-CM | POA: Diagnosis not present

## 2015-12-22 DIAGNOSIS — R531 Weakness: Secondary | ICD-10-CM | POA: Diagnosis not present

## 2015-12-22 DIAGNOSIS — Z8249 Family history of ischemic heart disease and other diseases of the circulatory system: Secondary | ICD-10-CM | POA: Diagnosis not present

## 2015-12-22 DIAGNOSIS — G8929 Other chronic pain: Secondary | ICD-10-CM | POA: Diagnosis present

## 2015-12-22 DIAGNOSIS — M25512 Pain in left shoulder: Secondary | ICD-10-CM | POA: Diagnosis not present

## 2015-12-22 DIAGNOSIS — N182 Chronic kidney disease, stage 2 (mild): Secondary | ICD-10-CM | POA: Diagnosis present

## 2015-12-22 HISTORY — DX: Ventricular premature depolarization: I49.3

## 2015-12-22 LAB — CBC WITH DIFFERENTIAL/PLATELET
BASOS PCT: 0 %
Basophils Absolute: 0 10*3/uL (ref 0.0–0.1)
EOS ABS: 0.2 10*3/uL (ref 0.0–0.7)
EOS PCT: 3 %
HCT: 37.7 % (ref 36.0–46.0)
HEMOGLOBIN: 12 g/dL (ref 12.0–15.0)
Lymphocytes Relative: 28 %
Lymphs Abs: 2 10*3/uL (ref 0.7–4.0)
MCH: 29.6 pg (ref 26.0–34.0)
MCHC: 31.8 g/dL (ref 30.0–36.0)
MCV: 92.9 fL (ref 78.0–100.0)
MONOS PCT: 9 %
Monocytes Absolute: 0.6 10*3/uL (ref 0.1–1.0)
NEUTROS PCT: 60 %
Neutro Abs: 4.2 10*3/uL (ref 1.7–7.7)
PLATELETS: 267 10*3/uL (ref 150–400)
RBC: 4.06 MIL/uL (ref 3.87–5.11)
RDW: 14.4 % (ref 11.5–15.5)
WBC: 7 10*3/uL (ref 4.0–10.5)

## 2015-12-22 LAB — COMPREHENSIVE METABOLIC PANEL
ALK PHOS: 64 U/L (ref 38–126)
ALT: 12 U/L — AB (ref 14–54)
AST: 20 U/L (ref 15–41)
Albumin: 3 g/dL — ABNORMAL LOW (ref 3.5–5.0)
Anion gap: 9 (ref 5–15)
BUN: 14 mg/dL (ref 6–20)
CALCIUM: 8.8 mg/dL — AB (ref 8.9–10.3)
CO2: 25 mmol/L (ref 22–32)
CREATININE: 1.19 mg/dL — AB (ref 0.44–1.00)
Chloride: 104 mmol/L (ref 101–111)
GFR, EST AFRICAN AMERICAN: 54 mL/min — AB (ref >60)
GFR, EST NON AFRICAN AMERICAN: 46 mL/min — AB (ref >60)
Glucose, Bld: 134 mg/dL — ABNORMAL HIGH (ref 65–99)
Potassium: 4.1 mmol/L (ref 3.5–5.1)
SODIUM: 138 mmol/L (ref 135–145)
Total Bilirubin: 0.6 mg/dL (ref 0.3–1.2)
Total Protein: 6 g/dL — ABNORMAL LOW (ref 6.5–8.1)

## 2015-12-22 LAB — TROPONIN I: Troponin I: <0.03 ng/mL (ref <0.03)

## 2015-12-22 LAB — HIV ANTIBODY (ROUTINE TESTING W REFLEX): HIV Screen 4th Generation wRfx: NONREACTIVE

## 2015-12-22 LAB — CK: Total CK: 67 U/L (ref 38–234)

## 2015-12-22 LAB — TSH: TSH: 11.446 u[IU]/mL — ABNORMAL HIGH (ref 0.350–4.500)

## 2015-12-22 LAB — STREP PNEUMONIAE URINARY ANTIGEN: Strep Pneumo Urinary Antigen: NEGATIVE

## 2015-12-22 MED ORDER — IBUPROFEN 200 MG PO TABS
400.0000 mg | ORAL_TABLET | Freq: Once | ORAL | Status: AC
  Administered 2015-12-22: 400 mg via ORAL
  Filled 2015-12-22: qty 2

## 2015-12-22 MED ORDER — HYDRALAZINE HCL 20 MG/ML IJ SOLN: 10.0000 mg | INTRAMUSCULAR | Status: DC | PRN

## 2015-12-22 MED ORDER — ESCITALOPRAM OXALATE 10 MG PO TABS
10.0000 mg | ORAL_TABLET | Freq: Every day | ORAL | Status: DC
  Administered 2015-12-22 – 2015-12-24 (×3): 10 mg via ORAL
  Filled 2015-12-22 (×3): qty 1

## 2015-12-22 MED ORDER — ONDANSETRON HCL 4 MG/2ML IJ SOLN: 4.0000 mg | Freq: Four times a day (QID) | INTRAMUSCULAR | Status: DC | PRN

## 2015-12-22 MED ORDER — ACETAMINOPHEN 325 MG PO TABS
650.0000 mg | ORAL_TABLET | Freq: Four times a day (QID) | ORAL | Status: DC | PRN
  Administered 2015-12-23: 650 mg via ORAL
  Filled 2015-12-22 (×2): qty 2

## 2015-12-22 MED ORDER — ENOXAPARIN SODIUM 60 MG/0.6ML ~~LOC~~ SOLN
60.0000 mg | SUBCUTANEOUS | Status: DC
  Administered 2015-12-22 – 2015-12-24 (×3): 60 mg via SUBCUTANEOUS
  Filled 2015-12-22 (×3): qty 0.6

## 2015-12-22 MED ORDER — CEFTRIAXONE SODIUM 1 G IJ SOLR
1.0000 g | INTRAMUSCULAR | Status: DC
  Filled 2015-12-22: qty 10

## 2015-12-22 MED ORDER — AZITHROMYCIN 500 MG PO TABS: 500.0000 mg | ORAL_TABLET | Freq: Every day | ORAL | Status: DC

## 2015-12-22 MED ORDER — TRAMADOL HCL 50 MG PO TABS
50.0000 mg | ORAL_TABLET | Freq: Four times a day (QID) | ORAL | Status: DC | PRN
  Administered 2015-12-22 – 2015-12-24 (×5): 50 mg via ORAL
  Filled 2015-12-22 (×5): qty 1

## 2015-12-22 MED ORDER — ACETAMINOPHEN 650 MG RE SUPP: 650.0000 mg | Freq: Four times a day (QID) | RECTAL | Status: DC | PRN

## 2015-12-22 MED ORDER — SODIUM CHLORIDE 0.9 % IV SOLN
INTRAVENOUS | Status: AC
  Administered 2015-12-22: 100 mL/h via INTRAVENOUS
  Administered 2015-12-22 (×2): via INTRAVENOUS

## 2015-12-22 MED ORDER — AZITHROMYCIN 500 MG IV SOLR
500.0000 mg | INTRAVENOUS | Status: DC
  Filled 2015-12-22: qty 500

## 2015-12-22 MED ORDER — ONDANSETRON HCL 4 MG PO TABS: 4.0000 mg | ORAL_TABLET | Freq: Four times a day (QID) | ORAL | Status: DC | PRN

## 2015-12-22 MED ORDER — LORATADINE 10 MG PO TABS
10.0000 mg | ORAL_TABLET | Freq: Every day | ORAL | Status: DC
  Administered 2015-12-22 – 2015-12-24 (×3): 10 mg via ORAL
  Filled 2015-12-22 (×3): qty 1

## 2015-12-22 NOTE — Progress Notes (Signed)
PROGRESS NOTE  Cheryl Lane  [id number] DOB: [birth year] DOA: 12/21/2015 PCP: Nicoletta Dress, MD Outpatient Specialists:  Subjective: Seen with her daughter at bedside, complaining about shoulder pain. Had some palpitations and bradycardia, cardiology to evaluate.  Brief Narrative:  Cheryl Lane is a 65 y.o. female with hypertension and osteoarthritis who was felt to be not a candidate for surgery as per the family presents to the ER because of weakness and was found to be bradycardic at home. Patient has been feeling increasingly weak over the last 4 days and was taken to [facility] 2 days ago and was found to be having increase creatinine and was told to stop the lisinopril and was given fluids and discharged home. At home yesterday, patient's home health aide found that patient's heart rate has been dropping down to the 30s. In the ER patient had no bradycardic episodes. But had generalized weakness. Chest x-ray shows pneumonia but patient denies any shortness of breath productive cough fever or chills. Patient has noticed over the last 4 days increasing left shoulder pain radiating from the neck. Patient has chronic left shoulder arthritis for which patient has been following up with orthopedic surgeon. Patient states the pain has worsened. Patient also has chronic bilateral knee pain.  Assessment & Plan:   Principal Problem:   Weakness generalized Active Problems:   CAP (community acquired pneumonia)   Essential hypertension   Pneumonia  Patient seen earlier today by my colleague Dr. Hal Hope. Decision no charge note. I have seen the patient, examine her and reviewed the chart. Left shoulder pain is likely secondary to chronic arthritic changes.  Generalized weakness  -Will get physical therapy consult and gently hydrate. Hold lisinopril for now. Patient states she has bradycardic episodes which we will monitor in telemetry and may need referral to cardiology for  Holter. Check TSH.  Possible acute renal failure from dehydration  - denies any nausea vomiting or diarrhea. Hold lisinopril and gently hydrate for now.   Possible community acquired pneumonia -Placed on Rocephin and Zithromax based on x-ray findings, CT scanning did not show any pneumonia. -Patient does not have any fever or sputum production we'll discontinue antibiotics.  Left shoulder and upper chest pain -Has chronic left shoulder pain, CT scan showed severe arthritic changes.  Hypertension - since patient is receiving IV fluids and has elevated creatinine lisinopril and HCTZ on hold. When necessary IV hydralazine for now. Baseline creatinine not known.  History of arthritis involving left shoulder and bilateral knees follows up with Dr. Delfino Lovett.   DVT prophylaxis: Lovenox Code Status: Full Code Family Communication: Plan discussed with the patient in the presence of her daughter at bedside. Disposition Plan:  Diet: Diet Heart Room service appropriate? Yes; Fluid consistency: Thin  Consultants:   Cardiology  Procedures:   None  Antimicrobials:   Rocephin and the visceral myosin, discontinued.   Objective: Vitals:   12/21/15 2215 12/21/15 2345 12/22/15 0041 12/22/15 0432  BP: 111/76 118/59 125/62 118/60  Pulse: 80 78 81 77  Resp: 22 20 20 18   Temp:   98.1 F (36.7 C) 98.1 F (36.7 C)  TempSrc:   Oral Oral  SpO2: 98% 96% 94% 98%  Weight:   120.3 kg (265 lb 3.4 oz)   Height:   5\' 5"  (1.651 m)     Intake/Output Summary (Last 24 hours) at 12/22/15 1301 Last data filed at 12/22/15 0724  Gross per 24 hour  Intake  310 ml  Output              575 ml  Net             -265 ml   Filed Weights   12/22/15 0041  Weight: 120.3 kg (265 lb 3.4 oz)    Examination: General exam: Appears calm and comfortable  Respiratory system: Clear to auscultation. Respiratory effort normal. Cardiovascular system: S1 & S2 heard, RRR. No JVD, murmurs, rubs, gallops or  clicks. No pedal edema. Gastrointestinal system: Abdomen is nondistended, soft and nontender. No organomegaly or masses felt. Normal bowel sounds heard. Central nervous system: Alert and oriented. No focal neurological deficits. Extremities: Symmetric 5 x 5 power. Skin: No rashes, lesions or ulcers Psychiatry: Judgement and insight appear normal. Mood & affect appropriate.   Data Reviewed: I have personally reviewed following labs and imaging studies  CBC:  Recent Labs Lab 12/21/15 2050 12/22/15 0455  WBC 7.0 7.0  NEUTROABS 3.9 4.2  HGB 12.7 12.0  HCT 40.6 37.7  MCV 93.3 92.9  PLT 284 [id number]   Basic Metabolic Panel:  Recent Labs Lab 12/21/15 2050 12/22/15 0455  NA 136 138  K 3.5 4.1  CL 104 104  CO2 25 25  GLUCOSE 106* 134*  BUN 16 14  CREATININE 1.23* 1.19*  CALCIUM 9.1 8.8*   GFR: Estimated Creatinine Clearance: 59.6 mL/min (by C-G formula based on SCr of 1.19 mg/dL). Liver Function Tests:  Recent Labs Lab 12/21/15 2050 12/22/15 0455  AST 18 20  ALT 12* 12*  ALKPHOS 68 64  BILITOT 0.7 0.6  PROT 6.6 6.0*  ALBUMIN 3.4* 3.0*   No results for input(s): LIPASE, AMYLASE in the last 168 hours. No results for input(s): AMMONIA in the last 168 hours. Coagulation Profile: No results for input(s): INR, PROTIME in the last 168 hours. Cardiac Enzymes:  Recent Labs Lab 12/22/15 0455 12/22/15 1116  CKTOTAL 67  --   TROPONINI <0.03 <0.03   BNP (last 3 results) No results for input(s): PROBNP in the last 8760 hours. HbA1C: No results for input(s): HGBA1C in the last 72 hours. CBG: No results for input(s): GLUCAP in the last 168 hours. Lipid Profile: No results for input(s): CHOL, HDL, LDLCALC, TRIG, CHOLHDL, LDLDIRECT in the last 72 hours. Thyroid Function Tests:  Recent Labs  12/22/15 0455  TSH 11.446*   Anemia Panel: No results for input(s): VITAMINB12, FOLATE, FERRITIN, TIBC, IRON, RETICCTPCT in the last 72 hours. Urine analysis: No results found  for: COLORURINE, APPEARANCEUR, LABSPEC, PHURINE, GLUCOSEU, HGBUR, BILIRUBINUR, KETONESUR, PROTEINUR, UROBILINOGEN, NITRITE, LEUKOCYTESUR Sepsis Labs: @LABRCNTIP (procalcitonin:4,lacticidven:4)  )No results found for this or any previous visit (from the past 240 hour(s)).   Invalid input(s): PROCALCITONIN, LACTICACIDVEN   Radiology Studies: Dg Chest 2 View  Result Date: 12/21/2015 CLINICAL DATA:  Left arm and chest pain. Low heart rate. Back pain. Symptoms for 3 days. Seen at [facility] yesterday for similar symptoms. EXAM: CHEST  2 VIEW COMPARISON:  None. FINDINGS: Shallow inspiration. Normal heart size and pulmonary vascularity. Increased density in the lung base posteriorly suggesting an infiltrate in the left lung base. Possible pneumonia. No blunting of costophrenic angles. No pneumothorax. Mediastinal contours appear intact. Degenerative changes in the spine and shoulders. IMPRESSION: Increased density in the left lung base posteriorly suggests focal pneumonia. Electronically Signed   By: Lucienne Capers M.D.   On: 12/21/2015 22:00   Ct Chest Wo Contrast  Result Date: 12/22/2015 CLINICAL DATA:  65 year old female with severe left neck shoulder  and arm pain with fever. Abnormal lower lobe opacity on chest radiographs. Initial encounter. EXAM: CT CHEST WITHOUT CONTRAST TECHNIQUE: Multidetector CT imaging of the chest was performed following the standard protocol without IV contrast. COMPARISON:  Chest radiographs 12/21/2015. FINDINGS: Large body habitus. Cardiovascular: Cardiac size at the upper limits of normal. Mild calcified aortic atherosclerosis in the chest. Mild to moderate calcified plaque in the visible upper abdominal aorta. Mild coronary artery calcified plaque is evident in the left coronary distribution. No pericardial effusion. Mediastinum/Nodes: No mediastinal or hilar lymphadenopathy. Moderate-sized gastric hiatal hernia, paraesophageal type. No axillary lymphadenopathy.  Lungs/Pleura: Major airways are patent. There is no pulmonary consolidation. The left lung is clear aside from occasional mild subpleural scarring. The right lung is clear. No pleural effusion. Upper Abdomen: Negative visualized noncontrast liver, spleen, pancreas, adrenal glands and kidneys. Negative visualized large and small intestine in the upper abdomen. Musculoskeletal: Advanced degenerative changes at the left sternoclavicular joint. Very severe degenerative changes at the left glenohumeral joint with severe joint space loss and bulky osteophytosis as seen on coronal image 58. Moderate to severe mid and lower thoracic chronic disc degeneration with endplate spurring. No acute osseous abnormality identified. IMPRESSION: 1. Negative lungs. No acute pulmonary findings. The lower lobe density on the recent chest radiographs seems to have been artifact. 2. Severe degenerative changes at the left glenohumeral joint. Moderate severe degenerative changes at the left sternoclavicular joint. Moderate to severe mid and lower thoracic degenerative changes. 3. Calcified aortic and coronary artery atherosclerosis. 4. Moderate size hiatal hernia. Electronically Signed   By: Genevie Ann M.D.   On: 12/22/2015 07:50        Scheduled Meds: . azithromycin  500 mg Oral Daily  . cefTRIAXone (ROCEPHIN)  IV  1 g Intravenous Q24H  . enoxaparin (LOVENOX) injection  60 mg Subcutaneous Q24H  . escitalopram  10 mg Oral Daily  . loratadine  10 mg Oral Daily   Continuous Infusions: . sodium chloride 100 mL/hr at 12/22/15 0940     LOS: 0 days    Time spent: 35 minutes    Deidrick Rainey A, MD [facility] Pager [phone]  If 7PM-7AM, please contact night-coverage [email or web address] Password [email or web address] 12/22/2015, 1:01 PM

## 2015-12-22 NOTE — H&P (Signed)
History and Physical    [place] [id number] DOB: May 03, 1950 DOA: 12/21/2015  PCP: Nicoletta Dress, MD  Patient coming from: Home.  Chief Complaint: Weakness.  HPI: Cheryl Lane is a 65 y.o. female with hypertension and osteoarthritis who was felt to be not a candidate for surgery as per the family presents to the ER because of weakness and was found to be bradycardic at home. Patient has been feeling increasingly weak over the last 4 days and was taken to [facility] 2 days ago and was found to be having increase creatinine and was told to stop the lisinopril and was given fluids and discharged home. At home yesterday, patient's home health aide found that patient's heart rate has been dropping down to the 30s. In the ER patient had no bradycardic episodes. But had generalized weakness. Chest x-ray shows pneumonia but patient denies any shortness of breath productive cough fever or chills. Patient has noticed over the last 4 days increasing left shoulder pain radiating from the neck. Patient has chronic left shoulder arthritis for which patient has been following up with orthopedic surgeon. Patient states the pain has worsened. Patient also has chronic bilateral knee pain.  ED Course: Patient was given antibiotics for pneumonia and fluids.  Review of Systems: As per HPI, rest all negative.   Past Medical History:  Diagnosis Date  . Hypertension     Past Surgical History:  Procedure Laterality Date  . APPENDECTOMY       reports that she has never smoked. She has never used smokeless tobacco. She reports that she does not drink alcohol or use drugs.  No Known Allergies  Family History  Problem Relation Age of Onset  . Hypertension Other     Prior to Admission medications   Medication Sig Start Date End Date Taking? Authorizing Provider  cetirizine (ZYRTEC) 10 MG tablet Take 10 mg by mouth daily.   Yes Historical Provider, MD  escitalopram (LEXAPRO) 10 MG  tablet Take 10 mg by mouth daily.   Yes Historical Provider, MD  hydrochlorothiazide (HYDRODIURIL) 25 MG tablet Take 25 mg by mouth daily.   Yes Historical Provider, MD  lisinopril (PRINIVIL,ZESTRIL) 20 MG tablet Take 20 mg by mouth every evening.   Yes Historical Provider, MD  traMADol (ULTRAM) 50 MG tablet Take 50 mg by mouth every 6 (six) hours as needed for moderate pain.   Yes Historical Provider, MD    Physical Exam: Vitals:   12/21/15 2204 12/21/15 2215 12/21/15 2345 12/22/15 0041  BP: (!) 119/38 111/76 118/59 125/62  Pulse: 78 80 78 81  Resp: 17 22 20 20   Temp:    98.1 F (36.7 C)  TempSrc:    Oral  SpO2: 95% 98% 96% 94%  Weight:    265 lb 3.4 oz (120.3 kg)  Height:    5\' 5"  (1.651 m)      Constitutional: Not in distress. Vitals:   12/21/15 2204 12/21/15 2215 12/21/15 2345 12/22/15 0041  BP: (!) 119/38 111/76 118/59 125/62  Pulse: 78 80 78 81  Resp: 17 22 20 20   Temp:    98.1 F (36.7 C)  TempSrc:    Oral  SpO2: 95% 98% 96% 94%  Weight:    265 lb 3.4 oz (120.3 kg)  Height:    5\' 5"  (1.651 m)   Eyes: Anicteric. No pallor. ENMT: No discharge from the ears eyes nose or mouth. Neck: No mass felt. No neck rigidity. No obvious skin rash around the  neck or shoulder. Respiratory: No rhonchi or crepitations. Cardiovascular: S1 and S2 heard. Abdomen: Soft nontender bowel sounds present. Musculoskeletal: No obvious swelling around the left shoulder. Skin: No rash. Neurologic: Alert awake oriented to time place and person. Moves all extremities. Psychiatric: Appears normal.   Labs on Admission: I have personally reviewed following labs and imaging studies  CBC:  Recent Labs Lab 12/21/15 2050  WBC 7.0  NEUTROABS 3.9  HGB 12.7  HCT 40.6  MCV 93.3  PLT [id number]   Basic Metabolic Panel:  Recent Labs Lab 12/21/15 2050  NA 136  K 3.5  CL 104  CO2 25  GLUCOSE 106*  BUN 16  CREATININE 1.23*  CALCIUM 9.1   GFR: Estimated Creatinine Clearance: 57.7 mL/min (by  C-G formula based on SCr of 1.23 mg/dL). Liver Function Tests:  Recent Labs Lab 12/21/15 2050  AST 18  ALT 12*  ALKPHOS 68  BILITOT 0.7  PROT 6.6  ALBUMIN 3.4*   No results for input(s): LIPASE, AMYLASE in the last 168 hours. No results for input(s): AMMONIA in the last 168 hours. Coagulation Profile: No results for input(s): INR, PROTIME in the last 168 hours. Cardiac Enzymes: No results for input(s): CKTOTAL, CKMB, CKMBINDEX, TROPONINI in the last 168 hours. BNP (last 3 results) No results for input(s): PROBNP in the last 8760 hours. HbA1C: No results for input(s): HGBA1C in the last 72 hours. CBG: No results for input(s): GLUCAP in the last 168 hours. Lipid Profile: No results for input(s): CHOL, HDL, LDLCALC, TRIG, CHOLHDL, LDLDIRECT in the last 72 hours. Thyroid Function Tests: No results for input(s): TSH, T4TOTAL, FREET4, T3FREE, THYROIDAB in the last 72 hours. Anemia Panel: No results for input(s): VITAMINB12, FOLATE, FERRITIN, TIBC, IRON, RETICCTPCT in the last 72 hours. Urine analysis: No results found for: COLORURINE, APPEARANCEUR, LABSPEC, PHURINE, GLUCOSEU, HGBUR, BILIRUBINUR, KETONESUR, PROTEINUR, UROBILINOGEN, NITRITE, LEUKOCYTESUR Sepsis Labs: @LABRCNTIP (procalcitonin:4,lacticidven:4) )No results found for this or any previous visit (from the past 240 hour(s)).   Radiological Exams on Admission: Dg Chest 2 View  Result Date: 12/21/2015 CLINICAL DATA:  Left arm and chest pain. Low heart rate. Back pain. Symptoms for 3 days. Seen at [facility] for similar symptoms. EXAM: CHEST  2 VIEW COMPARISON:  None. FINDINGS: Shallow inspiration. Normal heart size and pulmonary vascularity. Increased density in the lung base posteriorly suggesting an infiltrate in the left lung base. Possible pneumonia. No blunting of costophrenic angles. No pneumothorax. Mediastinal contours appear intact. Degenerative changes in the spine and shoulders. IMPRESSION: Increased density  in the left lung base posteriorly suggests focal pneumonia. Electronically Signed   By: Lucienne Capers M.D.   On: 12/21/2015 22:00    EKG: Independently reviewed. Normal sinus rhythm QTc is 475 ms.  Assessment/Plan Principal Problem:   Weakness generalized Active Problems:   CAP (community acquired pneumonia)   Essential hypertension   Pneumonia    1. Generalized weakness - will get physical therapy consult and gently hydrate. Hold lisinopril for now. Patient states she has bradycardic episodes which we will monitor in telemetry and may need referral to cardiology for Holter. Check TSH. 2. Possible acute renal failure from dehydration  - denies any nausea vomiting or diarrhea. Hold lisinopril and gently hydrate for now.  3.  Possible community acquired pneumonia - for now patient has been placed on ceftriaxone and Zithromax. Follow urine Legionella and strep antigen. Patient does not have any definite signs of pneumonia. Follow CT scan of the chest which is primarily done for the left  upper chest pain. 4. Left shoulder and upper chest pain - follow CT scan. Cycle cardiac markers. 5. Hypertension - since patient is receiving IV fluids and has elevated creatinine lisinopril and HCTZ on hold. When necessary IV hydralazine for now. Baseline creatinine not known. 6. History of arthritis involving left shoulder and bilateral knees follows up with Dr. Delfino Lovett.    DVT prophylaxis: Lovenox. Code Status: Full code.  Family Communication: Patient's daughter.  Disposition Plan: Home.  Consults called: Physical therapy.  Admission status: Observation.    Rise Patience MD [facility] Pager [phone].  If 7PM-7AM, please contact night-coverage [email or web address] Password [email or web address]  12/22/2015, 4:09 AM

## 2015-12-22 NOTE — Care Management Obs Status (Signed)
[facility] NOTIFICATION   Patient Details  Name: Cheryl Lane MRN: [id number] Date of Birth: February 01, 1951   Medicare Observation Status Notification Given:  Yes    Dawayne Patricia, RN 12/22/2015, 3:42 PM

## 2015-12-22 NOTE — Progress Notes (Signed)
M.D. Was paged and made aware of patient arrival to unit. Dr.  Hal Hope came and seen patient. Awaiting new orders.

## 2015-12-22 NOTE — Progress Notes (Signed)
Patient lying in bed, no needs at this time. Call light within reach. 

## 2015-12-22 NOTE — Consult Note (Signed)
Cardiology Consult    Patient ID: Cheryl Lane MRN: [id number], DOB/AGE: 1950/05/17   Admit date: 12/21/2015 Date of Consult: 12/22/2015  Primary Physician: Nicoletta Dress, MD Primary Cardiologist: New Requesting Provider: Dr. Hartford Poli Reason for Consultation: Weakness, Bradycardia  Patient Profile    65 year old female with past medical history of hypertension and osteoarthritis to presented to the ER with complaints of weakness and bradycardia.  Past Medical History   Past Medical History:  Diagnosis Date  . Hypertension     Past Surgical History:  Procedure Laterality Date  . APPENDECTOMY       Allergies  No Known Allergies  History of Present Illness    Cheryl Lane is a 65 year old female with only past medical history of hypertension and osteoarthritis. Reports she has never seen a cardiologist before or had a cardiac evaluation. Reports her only medical issues involved bilateral knees with osteoarthritis that she currently receives in-home physical therapy for, along with left shoulder pain questionable rotator cuff issue with no current plans to undergo surgery. She reports recently losing her husband about 4 months ago, and has been living with her daughter in [place] recently. States she has had a difficult time adjusting to losing her husband, and was recently placed on Prozac by her PCP to help with the grieving process.  Family reports that she is as active as possible given her knee pain. Patient denies any exertional dyspnea, or angina with regular activity. Reports over the past couple of days she has been feeling increasingly weak, with episodes of dizziness. Two days ago she reported worsening in her symptoms and was taken to [facility], and given IV fluids along with being told to stop her home lisinopril given her elevated renal function and discharged home after a brief stay in the ER. Again on 12/21/2015 physical therapy was working with the  patient and noted her heart rate to be in the mid 30s. Patient complained again of weakness, dizziness and left shoulder pain that radiated up into her neck and down her arm. Reports she normally has pain in left arm with movement given her problems with her rotator cuff but states this was more of a "hard" pressure. She was brought to the [facility] ED for further evaluation. EKG showed sinus rhythm with PVCs, and T-wave inversion in lead 3 and aVR. Troponins were cycled and negative 3. Labs showed normal electrolytes, and creatinine of 1.23. Chest x-ray showed density in the left lung base suggesting pneumonia. She was started on antibiotics.   In review of her telemetry her heart rate is noted at its lowest point in the 60s, but frequent PVCs observed. She has not been noted hypotensive. He has been given gentle IV hydration with improvement in her creatinine to 1.19. Of note her TSH was checked and noted at 11.446. She denies any history of thyroid issues. CT of her chest did not show any concerns for pneumonia so antibiotics have been discontinued. Her HCTZ has also been held this admission. She reported working with physical therapy earlier today and had another episode of dizziness, and lightheadedness and had to sit back down in the recliner while walking in the hallway. EKG afterward showed sinus rhythm.  Inpatient Medications    . enoxaparin (LOVENOX) injection  60 mg Subcutaneous Q24H  . escitalopram  10 mg Oral Daily  . loratadine  10 mg Oral Daily    Family History    Family History  Problem Relation Age of Onset  .  Hypertension Other     Social History    Social History   Social History  . Marital status: Widowed    Spouse name: N/A  . Number of children: N/A  . Years of education: N/A   Occupational History  . Not on file.   Social History Main Topics  . Smoking status: Never Smoker  . Smokeless tobacco: Never Used  . Alcohol use No  . Drug use: No  . Sexual  activity: Not on file   Other Topics Concern  . Not on file   Social History Narrative  . No narrative on file     Review of Systems    General:  No chills, fever, night sweats or weight changes.  Cardiovascular:  See HPI Dermatological: No rash, lesions/masses Respiratory: No cough, dyspnea Urologic: No hematuria, dysuria Abdominal:   No nausea, vomiting, diarrhea, bright red blood per rectum, melena, or hematemesis Neurologic:  No visual changes, wkns, changes in mental status. All other systems reviewed and are otherwise negative except as noted above.  Physical Exam    Blood pressure (!) 120/49, pulse 85, temperature 98.3 F (36.8 C), temperature source Oral, resp. rate 19, height 5\' 5"  (1.651 m), weight 265 lb 3.4 oz (120.3 kg), SpO2 96 %.  General: Pleasant, obese older Caucasian female, NAD Psych: Normal affect. Neuro: Alert and oriented X 3. Moves all extremities spontaneously. HEENT: Normal  Neck: Supple without bruits or JVD. Lungs:  Resp regular and unlabored, CTA. Heart: RRR no s3, s4, or murmurs. Abdomen: Soft, non-tender, non-distended, BS + x 4.  Extremities: No clubbing, cyanosis or edema. DP/PT/Radials 2+ and equal bilaterally.  Labs    Troponin [facility] Test)  Recent Labs  12/21/15 2228  TROPIPOC 0.01    Recent Labs  12/22/15 0455 12/22/15 1116  CKTOTAL 67  --   TROPONINI <0.03 <0.03   Lab Results  Component Value Date   WBC 7.0 12/22/2015   HGB 12.0 12/22/2015   HCT 37.7 12/22/2015   MCV 92.9 12/22/2015   PLT 267 12/22/2015    Recent Labs Lab 12/22/15 0455  NA 138  K 4.1  CL 104  CO2 25  BUN 14  CREATININE 1.19*  CALCIUM 8.8*  PROT 6.0*  BILITOT 0.6  ALKPHOS 64  ALT 12*  AST 20  GLUCOSE 134*   No results found for: CHOL, HDL, LDLCALC, TRIG No results found for: [facility]   Radiology Studies    Dg Chest 2 View  Result Date: 12/21/2015 CLINICAL DATA:  Left arm and chest pain. Low heart rate. Back pain. Symptoms for  3 days. Seen at [facility] yesterday for similar symptoms. EXAM: CHEST  2 VIEW COMPARISON:  None. FINDINGS: Shallow inspiration. Normal heart size and pulmonary vascularity. Increased density in the lung base posteriorly suggesting an infiltrate in the left lung base. Possible pneumonia. No blunting of costophrenic angles. No pneumothorax. Mediastinal contours appear intact. Degenerative changes in the spine and shoulders. IMPRESSION: Increased density in the left lung base posteriorly suggests focal pneumonia. Electronically Signed   By: Lucienne Capers M.D.   On: 12/21/2015 22:00   Ct Chest Wo Contrast  Result Date: 12/22/2015 CLINICAL DATA:  65 year old female with severe left neck shoulder and arm pain with fever. Abnormal lower lobe opacity on chest radiographs. Initial encounter. EXAM: CT CHEST WITHOUT CONTRAST TECHNIQUE: Multidetector CT imaging of the chest was performed following the standard protocol without IV contrast. COMPARISON:  Chest radiographs 12/21/2015. FINDINGS: Large body habitus. Cardiovascular: Cardiac  size at the upper limits of normal. Mild calcified aortic atherosclerosis in the chest. Mild to moderate calcified plaque in the visible upper abdominal aorta. Mild coronary artery calcified plaque is evident in the left coronary distribution. No pericardial effusion. Mediastinum/Nodes: No mediastinal or hilar lymphadenopathy. Moderate-sized gastric hiatal hernia, paraesophageal type. No axillary lymphadenopathy. Lungs/Pleura: Major airways are patent. There is no pulmonary consolidation. The left lung is clear aside from occasional mild subpleural scarring. The right lung is clear. No pleural effusion. Upper Abdomen: Negative visualized noncontrast liver, spleen, pancreas, adrenal glands and kidneys. Negative visualized large and small intestine in the upper abdomen. Musculoskeletal: Advanced degenerative changes at the left sternoclavicular joint. Very severe degenerative changes at the  left glenohumeral joint with severe joint space loss and bulky osteophytosis as seen on coronal image 58. Moderate to severe mid and lower thoracic chronic disc degeneration with endplate spurring. No acute osseous abnormality identified. IMPRESSION: 1. Negative lungs. No acute pulmonary findings. The lower lobe density on the recent chest radiographs seems to have been artifact. 2. Severe degenerative changes at the left glenohumeral joint. Moderate severe degenerative changes at the left sternoclavicular joint. Moderate to severe mid and lower thoracic degenerative changes. 3. Calcified aortic and coronary artery atherosclerosis. 4. Moderate size hiatal hernia. Electronically Signed   By: Genevie Ann M.D.   On: 12/22/2015 07:50    ECG & Cardiac Imaging    EKG: SR with PVCs and TWI in lead III, aVR  Echo: None  Assessment & Plan    65 year old female with past medical history of hypertension and osteoarthritis to presented to the ER with complaints of weakness and bradycardia.  1. Weakness/dizziness: In review of her telemetry, her lowest heart rate noted is in the 60s. Does reports dizziness with position changes during admission. Currently being treated with gentle IV hydration. No episodes. Appears to have had orthostatic VS done in the ED, but documentation is unclear. It is difficult to know if her heart was was truly in the 30s while at home, given only the use of a pulse ox. She also reported that it was checked multiple times and different reading showed rates in the 60s. Her TSH was noted at 11 this admission, which could be a contributing factor in her symptoms. Would began thyroid replacement. Home lisinopril and HTCZ have been held this admission, may need to address whether to continue these in the outpatient setting. HCTZ could be a factor in her symptoms.  -- Trop cycled neg x3.  -- Again in review of telemetry, only noted PVCs. Could benefit from a cardiac monitor in the outpatient  setting given her recent symptoms. Would avoid the addition of BB with her reported bradycardia.  2. AKI: Cr has improved today. Lisinopril held  3. HTN: Stable, home meds held  Cheryl Pall, NP-C Pager [phone] 12/22/2015, 4:41 PM Patient seen and examined and history reviewed. Agree with above findings and plan. 65 yo WF patient of Dr. Delena Bali in [place] presents for evaluation of weakness and left shoulder pain. She complains of severe left shoulder pain that is different than her usual rotator cuff pain. She also complains of generalized weakness without palpitations or dizziness. No chest pain. Mild dyspnea on exertion. Activity limited by severe OA. Noted at home to have slow HR by pulse ox.   On exam she is an obese WF, tearful, NAD Lungs are clear. No JVD CV without gallop or murmur  Telemetry reviewed- this shows NSR with frequent PVCs- isolated.  Impression:  1. Left shoulder pain- musculoskeletal. Clearly worse with movement. Normal troponins. No acute Ecg changes.  2. Weakness. Multifactorial. Includes dehydration, deconditioning. ? Hypothyroid with low TSH.  3. PVCs. I suspect pulse ox at home undercounted HR due to PVCs. She does not appear symptomatic. Will check Echo. Could consider Holter monitor post discharge to assess PVC burden but I think yield will be low.  4. Elevated TSH 5. Mild coronary calcification noted on CT in LAD diagonal distribution. No angina. Will check lipids. Modify risk factors. In lack of symptoms I would not pursue ischemic evaluation unless EF low on Echo.   Cheryl Lane, [place] 12/22/2015 5:37 PM

## 2015-12-22 NOTE — Evaluation (Signed)
Physical Therapy Evaluation Patient Details Name: Antoinett Murnahan MRN: [id number] DOB: 1950/05/09 Today's Date: 12/22/2015   History of Present Illness  Fani Hayley is a 65 y.o. female with hypertension and osteoarthritis who was felt to be not a candidate for surgery as per the family presents to the ER because of weakness and was found to be bradycardic at home. Patient has been feeling increasingly weak over the last 4 days.  pt found to have asymoptomatic pneumonia.  getting work up from cardiology. Pts husband passed away 5 months ago.  pt has been taking her antidepressant on and off - even with education that that is not how it worksk/good for her.  pt had [facility] services from March -June and just started again 3 weeks ago with [facility].    pt reports she has lost 23 pounds in last 2 months - has no appetite.  pt agreed to take her antidepressants as ordered.  Daughter has nursing back ground but not currently working as Marine scientist   Clinical Impression  Pt did better physically than I thought she would. She has good support from daughter.  Pt has good set up with [facility]. Pt to go home with daughter and [facility] when medically. She had irregular HR noted after walking - EKG done shortly after that.  Pt does not want SNF - she has 24/7 care at home    Follow Up Recommendations Home health PT    Equipment Recommendations   (pt needs wide 3n1 to help with getting up 4-5 times during the night)    Recommendations for Other Services       Precautions / Restrictions Precautions Precautions: Fall Restrictions Other Position/Activity Restrictions: pt has limited use of left UE from chronic arthriits - MD aware      Mobility  Bed Mobility Overal bed mobility: Needs Assistance Bed Mobility: Supine to Sit     Supine to sit: Min assist;HOB elevated     General bed mobility comments: pt used rail on bed - she doesnt have rail at home - educated daughter on adding one to pts  bed  Transfers Overall transfer level: Needs assistance Equipment used: 1 person hand held assist Transfers: Sit to/from [facility] Sit to Stand: Min assist Stand pivot transfers: Min assist       General transfer comment: pt toileted on [facility] prior to walk - educated on making sure BSC and bed are raised to make her transfer easier for her  Ambulation/Gait Ambulation/Gait assistance: Min assist;+2 safety/equipment Ambulation Distance (Feet): 40 Feet Assistive device: Straight cane Gait Pattern/deviations: Shuffle;Wide base of support     General Gait Details: pt with shufflng gait with cane.  had to stop every 10 feet to catch her breath.  chair followed behind pt for safety  Stairs            Wheelchair Mobility    Modified Rankin (Stroke Patients Only)       Balance                                             Pertinent Vitals/Pain Pain Assessment: 0-10 Pain Location: pt with chronic pain - back, knees and esp left shoulder.  she is very involved working with her orthopedist Pain Intervention(s): Repositioned    Home Living Family/patient expects to be discharged to:: Private residence Living Arrangements: Children  Available Help at Discharge: Family;Home health;Personal care attendant;Available 24 hours/day (daughter reports it is set up for pt to have help 24/7.  pt gets panicky if not) Type of Home: House       Home Layout: One level Home Equipment: Walker - 4 wheels;Cane - single point Additional Comments: talked in lenght about  home setup.   Pt has had [facility] for months this year - to help address this.  pt does not have BSC - a wide one would help pt with getting up during the night as she says hse has to get up 4-5 times at night due to fluid pills    Prior Function Level of Independence: Needs assistance               Hand Dominance        Extremity/Trunk Assessment               Lower Extremity  Assessment: Generalized weakness      Cervical / Trunk Assessment: Normal  Communication   Communication: No difficulties  Cognition Arousal/Alertness: Awake/alert Behavior During Therapy: WFL for tasks assessed/performed;Flat affect Overall Cognitive Status: History of cognitive impairments - at baseline (pt tearful at end of session.  encouraged to take her antidepressants as ordered.)                      General Comments General comments (skin integrity, edema, etc.): pt safe with her static standing - when weight shifting she moves trunk alot making min assist needed for safety    Exercises        Assessment/Plan    PT Assessment Patient needs continued PT services  PT Diagnosis Difficulty walking;Generalized weakness (chronic pain)   PT Problem List Decreased strength;Decreased activity tolerance;Decreased balance;Decreased mobility;Decreased safety awareness;Pain  PT Treatment Interventions DME instruction;Gait training;Functional mobility training;Therapeutic activities;Therapeutic exercise;Patient/family education   PT Goals (Current goals can be found in the Care Plan section) Acute Rehab PT Goals Patient Stated Goal: to get home with [facility] and feel better PT Goal Formulation: With patient/family Time For Goal Achievement: 12/31/15 Potential to Achieve Goals: Good    Frequency Min 3X/week   Barriers to discharge        Co-evaluation               End of Session Equipment Utilized During Treatment: Gait belt Activity Tolerance: Patient limited by fatigue Patient left: in chair;with chair alarm set;with family/visitor present Nurse Communication: Mobility status    Functional Assessment Tool Used: clinical judgement Functional Limitation: Mobility: Walking and moving around Mobility: Walking and Moving Around Current Status [phone]): At least 20 percent but less than 40 percent impaired, limited or restricted Mobility: Walking and Moving Around  Goal Status [phone]): At least 1 percent but less than 20 percent impaired, limited or restricted    Time: 1225-1310 PT Time Calculation (min) (ACUTE ONLY): 45 min   Charges:   PT Evaluation $PT Eval Moderate Complexity: 1 Procedure PT Treatments $Gait Training: 8-22 mins $Therapeutic Activity: 8-22 mins   PT G Codes:   PT G-Codes **NOT FOR INPATIENT CLASS** Functional Assessment Tool Used: clinical judgement Functional Limitation: Mobility: Walking and moving around Mobility: Walking and Moving Around Current Status [id number]): At least 20 percent but less than 40 percent impaired, limited or restricted Mobility: Walking and Moving Around Goal Status [phone]): At least 1 percent but less than 20 percent impaired, limited or restricted    Loyal Buba 12/22/2015, 1:40 PM 12/22/2015  Rande Lawman, PT

## 2015-12-23 ENCOUNTER — Inpatient Hospital Stay (HOSPITAL_COMMUNITY): Payer: Medicare Other

## 2015-12-23 DIAGNOSIS — I10 Essential (primary) hypertension: Secondary | ICD-10-CM

## 2015-12-23 DIAGNOSIS — J189 Pneumonia, unspecified organism: Principal | ICD-10-CM

## 2015-12-23 DIAGNOSIS — I5189 Other ill-defined heart diseases: Secondary | ICD-10-CM

## 2015-12-23 DIAGNOSIS — M25512 Pain in left shoulder: Secondary | ICD-10-CM

## 2015-12-23 DIAGNOSIS — I517 Cardiomegaly: Secondary | ICD-10-CM

## 2015-12-23 DIAGNOSIS — M25519 Pain in unspecified shoulder: Secondary | ICD-10-CM

## 2015-12-23 DIAGNOSIS — R918 Other nonspecific abnormal finding of lung field: Secondary | ICD-10-CM

## 2015-12-23 HISTORY — DX: Other ill-defined heart diseases: I51.89

## 2015-12-23 HISTORY — DX: Cardiomegaly: I51.7

## 2015-12-23 LAB — BASIC METABOLIC PANEL
Anion gap: 7 (ref 5–15)
BUN: 13 mg/dL (ref 6–20)
CO2: 24 mmol/L (ref 22–32)
Calcium: 8.8 mg/dL — ABNORMAL LOW (ref 8.9–10.3)
Chloride: 106 mmol/L (ref 101–111)
Creatinine, Ser: 1.19 mg/dL — ABNORMAL HIGH (ref 0.44–1.00)
GFR calc Af Amer: 54 mL/min — ABNORMAL LOW (ref >60)
GFR, EST NON AFRICAN AMERICAN: 46 mL/min — AB (ref >60)
GLUCOSE: 154 mg/dL — AB (ref 65–99)
POTASSIUM: 3.7 mmol/L (ref 3.5–5.1)
Sodium: 137 mmol/L (ref 135–145)

## 2015-12-23 LAB — LIPID PANEL
CHOL/HDL RATIO: 3.8 ratio
CHOLESTEROL: 183 mg/dL (ref 0–200)
HDL: 48 mg/dL (ref >40)
LDL Cholesterol: 119 mg/dL — ABNORMAL HIGH (ref 0–99)
Triglycerides: 81 mg/dL (ref <150)
VLDL: 16 mg/dL (ref 0–40)

## 2015-12-23 LAB — CBC
HCT: 37.6 % (ref 36.0–46.0)
Hemoglobin: 11.5 g/dL — ABNORMAL LOW (ref 12.0–15.0)
MCH: 28.5 pg (ref 26.0–34.0)
MCHC: 30.6 g/dL (ref 30.0–36.0)
MCV: 93.3 fL (ref 78.0–100.0)
PLATELETS: 275 10*3/uL (ref 150–400)
RBC: 4.03 MIL/uL (ref 3.87–5.11)
RDW: 14.4 % (ref 11.5–15.5)
WBC: 5.1 10*3/uL (ref 4.0–10.5)

## 2015-12-23 LAB — MAGNESIUM: Magnesium: 1.7 mg/dL (ref 1.7–2.4)

## 2015-12-23 LAB — LEGIONELLA PNEUMOPHILA SEROGP 1 UR AG: L. pneumophila Serogp 1 Ur Ag: NEGATIVE

## 2015-12-23 LAB — ECHOCARDIOGRAM COMPLETE
Height: 65 in
Weight: 4243.41 oz

## 2015-12-23 LAB — T4, FREE: FREE T4: 0.72 ng/dL (ref 0.61–1.12)

## 2015-12-23 MED ORDER — POTASSIUM CHLORIDE CRYS ER 20 MEQ PO TBCR
40.0000 meq | EXTENDED_RELEASE_TABLET | Freq: Once | ORAL | Status: AC
  Administered 2015-12-23: 40 meq via ORAL
  Filled 2015-12-23: qty 2

## 2015-12-23 MED ORDER — LEVOTHYROXINE SODIUM 50 MCG PO TABS
50.0000 ug | ORAL_TABLET | Freq: Every day | ORAL | Status: DC
  Administered 2015-12-24: 50 ug via ORAL
  Filled 2015-12-23: qty 1

## 2015-12-23 MED ORDER — ENSURE ENLIVE PO LIQD
237.0000 mL | Freq: Two times a day (BID) | ORAL | Status: DC
  Administered 2015-12-24: 237 mL via ORAL

## 2015-12-23 MED ORDER — MAGNESIUM SULFATE IN D5W 1-5 GM/100ML-% IV SOLN
1.0000 g | Freq: Once | INTRAVENOUS | Status: AC
  Administered 2015-12-23: 1 g via INTRAVENOUS
  Filled 2015-12-23: qty 100

## 2015-12-23 NOTE — Progress Notes (Signed)
Physical Therapy Treatment Patient Details Name: Cheryl Lane MRN: [id number] DOB: 05/02/1950 Today's Date: 12/23/2015    History of Present Illness Cheryl Lane is a 65 y.o. female with hypertension and osteoarthritis who was felt to be not a candidate for surgery as per the family presents to the ER because of weakness and was found to be bradycardic at home. She has been on and off antidepressants and has lost 23 lbs in 2 months.  Spouse passed away 5 months ago.    PT Comments    Patient progressing some this session with transfers and endurance.  Able to walk without rest stops, but still limited to short distance. Feel continued skilled PT in the acute setting needed to allow safe return home with family support and continued HHPT.   Follow Up Recommendations  Home health PT     Equipment Recommendations  3in1 (PT) (wide)    Recommendations for Other Services       Precautions / Restrictions Precautions Precautions: Fall Restrictions Weight Bearing Restrictions: No Other Position/Activity Restrictions: pt has limited use of left UE from chronic arthriits - MD aware    Mobility  Bed Mobility Overal bed mobility: Needs Assistance Bed Mobility: Supine to Sit     Supine to sit: Min assist;HOB elevated     General bed mobility comments: assisted to lift trunk with R hand  Transfers Overall transfer level: Needs assistance Equipment used: Rolling walker (2 wheeled) Transfers: Sit to/from Stand Sit to Stand: Supervision         General transfer comment: momentum technique  Ambulation/Gait Ambulation/Gait assistance: Min assist Ambulation Distance (Feet): 45 Feet Assistive device: Straight cane Gait Pattern/deviations: Step-through pattern;Decreased stance time - right;Antalgic;Wide base of support     General Gait Details: lumbering with increased lateral sway, stopped on her own due to fatigue with chair following her for safety, HR max  109   Stairs            Wheelchair Mobility    Modified Rankin (Stroke Patients Only)       Balance Overall balance assessment: Needs assistance           Standing balance-Leahy Scale: Fair Standing balance comment: standing static short amount of time due to R knee pain, shifts off of it due to feeling crepitus and reporting bone on bone changes                    Cognition Arousal/Alertness: Awake/alert Behavior During Therapy: WFL for tasks assessed/performed Overall Cognitive Status: Within Functional Limits for tasks assessed                      Exercises General Exercises - Lower Extremity Ankle Circles/Pumps: 20 reps;Both;5 reps;Supine Heel Slides: AROM;Both;5 reps;Supine    General Comments        Pertinent Vitals/Pain Pain Assessment: Faces Faces Pain Scale: Hurts even more Pain Location: R knee with standing Pain Descriptors / Indicators: Aching Pain Intervention(s): Limited activity within patient's tolerance;Monitored during session;Repositioned    Home Living                      Prior Function            PT Goals (current goals can now be found in the care plan section) Progress towards PT goals: Progressing toward goals    Frequency  Min 3X/week    PT Plan Current plan remains appropriate    Co-evaluation  End of Session Equipment Utilized During Treatment: Gait belt Activity Tolerance: Patient limited by fatigue Patient left: in chair;with call bell/phone within reach;with chair alarm set     Time: 0840-0900 PT Time Calculation (min) (ACUTE ONLY): 20 min  Charges:  $Gait Training: 8-22 mins                    G Codes:      Reginia Naas Dec 27, 2015, 9:45 AM  Magda Kiel, [place] Dec 27, 2015

## 2015-12-23 NOTE — Care Management Note (Signed)
Case Management Note Marvetta Gibbons RN, BSN Unit 2W-Case Manager [phone]  Patient Details  Name: Katiann Hubby MRN: [id number] Date of Birth: 24-Jan-1951  Subjective/Objective:   Pt admitted with weakness  And CAP                Action/Plan: PTA pt was staying with her daughter and was active for [facility] PT/OT with [facility] (formally [facility])- will need resumption orders for [facility] services when ready for discharge, spoke with pt and other daughter at bedside- and confirmed plan to return home with [facility] at this time.   Expected Discharge Date:                  Expected Discharge Plan:  [place]  In-House Referral:     Discharge planning Services  CM Consult  Post Acute Care Choice:  Resumption of Svcs/PTA Provider, Home Health Choice offered to:  Patient  DME Arranged:    DME Agency:     HH Arranged:  PT, OT HH Agency:  [place]  Status of Service:  In process, will continue to follow  If discussed at Long Length of Stay Meetings, dates discussed:    Additional Comments:  Dawayne Patricia, RN 12/23/2015, 11:10 AM

## 2015-12-23 NOTE — Progress Notes (Signed)
PROGRESS NOTE  Cheryl Lane  [id number] DOB: 1950/04/26 DOA: 12/21/2015 PCP: Nicoletta Dress, MD Outpatient Specialists:  Subjective: Denies any chest pain or palpitation today, seen by PT recommended home health PT.   Brief Narrative:  Cheryl Lane is a 65 y.o. female with hypertension and osteoarthritis who was felt to be not a candidate for surgery as per the family presents to the ER because of weakness and was found to be bradycardic at home. Patient has been feeling increasingly weak over the last 4 days and was taken to [facility] 2 days ago and was found to be having increase creatinine and was told to stop the lisinopril and was given fluids and discharged home. At home yesterday, patient's home health aide found that patient's heart rate has been dropping down to the 30s. In the ER patient had no bradycardic episodes. But had generalized weakness. Chest x-ray shows pneumonia but patient denies any shortness of breath productive cough fever or chills. Patient has noticed over the last 4 days increasing left shoulder pain radiating from the neck. Patient has chronic left shoulder arthritis for which patient has been following up with orthopedic surgeon. Patient states the pain has worsened. Patient also has chronic bilateral knee pain.  Assessment & Plan:   Principal Problem:   Weakness generalized Active Problems:   CAP (community acquired pneumonia)   Essential hypertension   Pneumonia   PVC's (premature ventricular contractions)   Bradycardia -Seen by cardiology, reportedly heart rate was in the 30s reported by ENT. -Patient is on telemetry, heart rate was never less than 60 in the hospital with multiple PVCs. -Per cardiology follow-up on echocardiogram and if LVF is okay likely this is not cardiac problems.  Generalized weakness  -Will get physical therapy consult and gently hydrate. Hold lisinopril for now.  -TSH is 11.446, will start on low-dose of  Synthroid and check T3 and free T4.  Possible acute renal failure from dehydration  - denies any nausea vomiting or diarrhea. Hold lisinopril and gently hydrate for now.   Possible community acquired pneumonia -Placed on Rocephin and Zithromax based on x-ray findings, CT scanning did not show any pneumonia. -Patient does not have any fever or sputum production we'll discontinue antibiotics.  Left shoulder and upper chest pain -Has chronic left shoulder pain, CT scan showed severe arthritic changes.  Hypertension - since patient is receiving IV fluids and has elevated creatinine lisinopril and HCTZ on hold. When necessary IV hydralazine for now. Baseline creatinine not known.  History of arthritis involving left shoulder and bilateral knees follows up with Dr. Delfino Lovett.   DVT prophylaxis: Lovenox Code Status: Full Code Family Communication: Plan discussed with the patient. Disposition Plan:  Diet: Diet Heart Room service appropriate? Yes; Fluid consistency: Thin  Consultants:   Cardiology  Procedures:   None  Antimicrobials:   Rocephin and the visceral myosin, discontinued.   Objective: Vitals:   12/22/15 0432 12/22/15 1300 12/22/15 2210 12/23/15 0440  BP: 118/60 (!) 120/49 126/60 120/75  Pulse: 77 85 71 77  Resp: 18 19 18 18   Temp: 98.1 F (36.7 C) 98.3 F (36.8 C) 98 F (36.7 C) 98 F (36.7 C)  TempSrc: Oral Oral Oral Oral  SpO2: 98% 96% 96% 97%  Weight:      Height:        Intake/Output Summary (Last 24 hours) at 12/23/15 1315 Last data filed at 12/23/15 0442  Gross per 24 hour  Intake  400 ml  Output              600 ml  Net             -200 ml   Filed Weights   12/22/15 0041  Weight: 120.3 kg (265 lb 3.4 oz)    Examination: General exam: Appears calm and comfortable  Respiratory system: Clear to auscultation. Respiratory effort normal. Cardiovascular system: S1 & S2 heard, RRR. No JVD, murmurs, rubs, gallops or clicks. No pedal  edema. Gastrointestinal system: Abdomen is nondistended, soft and nontender. No organomegaly or masses felt. Normal bowel sounds heard. Central nervous system: Alert and oriented. No focal neurological deficits. Extremities: Symmetric 5 x 5 power. Skin: No rashes, lesions or ulcers Psychiatry: Judgement and insight appear normal. Mood & affect appropriate.   Data Reviewed: I have personally reviewed following labs and imaging studies  CBC:  Recent Labs Lab 12/21/15 2050 12/22/15 0455 12/23/15 0158  WBC 7.0 7.0 5.1  NEUTROABS 3.9 4.2  --   HGB 12.7 12.0 11.5*  HCT 40.6 37.7 37.6  MCV 93.3 92.9 93.3  PLT 284 267 [id number]   Basic Metabolic Panel:  Recent Labs Lab 12/21/15 2050 12/22/15 0455 12/23/15 0158 12/23/15 1028  NA 136 138 137  --   K 3.5 4.1 3.7  --   CL 104 104 106  --   CO2 25 25 24   --   GLUCOSE 106* 134* 154*  --   BUN 16 14 13   --   CREATININE 1.23* 1.19* 1.19*  --   CALCIUM 9.1 8.8* 8.8*  --   MG  --   --   --  1.7   GFR: Estimated Creatinine Clearance: 59.6 mL/min (by C-G formula based on SCr of 1.19 mg/dL). Liver Function Tests:  Recent Labs Lab 12/21/15 2050 12/22/15 0455  AST 18 20  ALT 12* 12*  ALKPHOS 68 64  BILITOT 0.7 0.6  PROT 6.6 6.0*  ALBUMIN 3.4* 3.0*   No results for input(s): LIPASE, AMYLASE in the last 168 hours. No results for input(s): AMMONIA in the last 168 hours. Coagulation Profile: No results for input(s): INR, PROTIME in the last 168 hours. Cardiac Enzymes:  Recent Labs Lab 12/22/15 0455 12/22/15 1116 12/22/15 1548  CKTOTAL 67  --   --   TROPONINI <0.03 <0.03 <0.03   BNP (last 3 results) No results for input(s): PROBNP in the last 8760 hours. HbA1C: No results for input(s): HGBA1C in the last 72 hours. CBG: No results for input(s): GLUCAP in the last 168 hours. Lipid Profile:  Recent Labs  12/23/15 0158  CHOL 183  HDL 48  LDLCALC 119*  TRIG 81  CHOLHDL 3.8   Thyroid Function Tests:  Recent Labs   12/22/15 0455  TSH 11.446*   Anemia Panel: No results for input(s): VITAMINB12, FOLATE, FERRITIN, TIBC, IRON, RETICCTPCT in the last 72 hours. Urine analysis: No results found for: COLORURINE, APPEARANCEUR, LABSPEC, PHURINE, GLUCOSEU, HGBUR, BILIRUBINUR, KETONESUR, PROTEINUR, UROBILINOGEN, NITRITE, LEUKOCYTESUR Sepsis Labs: @LABRCNTIP (procalcitonin:4,lacticidven:4)  ) Recent Results (from the past 240 hour(s))  Culture, blood (routine x 2)     Status: None (Preliminary result)   Collection Time: 12/22/15 12:02 AM  Result Value Ref Range Status   Specimen Description BLOOD LEFT WRIST  Final   Special Requests BOTTLES DRAWN AEROBIC AND ANAEROBIC 5ML  Final   Culture NO GROWTH < 24 HOURS  Final   Report Status PENDING  Incomplete  Culture, blood (routine x 2)  Status: None (Preliminary result)   Collection Time: 12/22/15 12:05 AM  Result Value Ref Range Status   Specimen Description BLOOD RIGHT HAND  Final   Special Requests BOTTLES DRAWN AEROBIC AND ANAEROBIC 5ML  Final   Culture NO GROWTH < 24 HOURS  Final   Report Status PENDING  Incomplete     Invalid input(s): PROCALCITONIN, LACTICACIDVEN   Radiology Studies: Dg Chest 2 View  Result Date: 12/21/2015 CLINICAL DATA:  Left arm and chest pain. Low heart rate. Back pain. Symptoms for 3 days. Seen at [facility] yesterday for similar symptoms. EXAM: CHEST  2 VIEW COMPARISON:  None. FINDINGS: Shallow inspiration. Normal heart size and pulmonary vascularity. Increased density in the lung base posteriorly suggesting an infiltrate in the left lung base. Possible pneumonia. No blunting of costophrenic angles. No pneumothorax. Mediastinal contours appear intact. Degenerative changes in the spine and shoulders. IMPRESSION: Increased density in the left lung base posteriorly suggests focal pneumonia. Electronically Signed   By: Lucienne Capers M.D.   On: 12/21/2015 22:00   Ct Chest Wo Contrast  Result Date: 12/22/2015 CLINICAL DATA:   65 year old female with severe left neck shoulder and arm pain with fever. Abnormal lower lobe opacity on chest radiographs. Initial encounter. EXAM: CT CHEST WITHOUT CONTRAST TECHNIQUE: Multidetector CT imaging of the chest was performed following the standard protocol without IV contrast. COMPARISON:  Chest radiographs 12/21/2015. FINDINGS: Large body habitus. Cardiovascular: Cardiac size at the upper limits of normal. Mild calcified aortic atherosclerosis in the chest. Mild to moderate calcified plaque in the visible upper abdominal aorta. Mild coronary artery calcified plaque is evident in the left coronary distribution. No pericardial effusion. Mediastinum/Nodes: No mediastinal or hilar lymphadenopathy. Moderate-sized gastric hiatal hernia, paraesophageal type. No axillary lymphadenopathy. Lungs/Pleura: Major airways are patent. There is no pulmonary consolidation. The left lung is clear aside from occasional mild subpleural scarring. The right lung is clear. No pleural effusion. Upper Abdomen: Negative visualized noncontrast liver, spleen, pancreas, adrenal glands and kidneys. Negative visualized large and small intestine in the upper abdomen. Musculoskeletal: Advanced degenerative changes at the left sternoclavicular joint. Very severe degenerative changes at the left glenohumeral joint with severe joint space loss and bulky osteophytosis as seen on coronal image 58. Moderate to severe mid and lower thoracic chronic disc degeneration with endplate spurring. No acute osseous abnormality identified. IMPRESSION: 1. Negative lungs. No acute pulmonary findings. The lower lobe density on the recent chest radiographs seems to have been artifact. 2. Severe degenerative changes at the left glenohumeral joint. Moderate severe degenerative changes at the left sternoclavicular joint. Moderate to severe mid and lower thoracic degenerative changes. 3. Calcified aortic and coronary artery atherosclerosis. 4. Moderate size  hiatal hernia. Electronically Signed   By: Genevie Ann M.D.   On: 12/22/2015 07:50        Scheduled Meds: . enoxaparin (LOVENOX) injection  60 mg Subcutaneous Q24H  . escitalopram  10 mg Oral Daily  . loratadine  10 mg Oral Daily   Continuous Infusions:     LOS: 1 day    Time spent: 35 minutes    Hanna Ra A, MD [facility] Pager [phone]  If 7PM-7AM, please contact night-coverage [email or web address] Password [email or web address] 12/23/2015, 1:15 PM

## 2015-12-23 NOTE — Progress Notes (Addendum)
Initial Nutrition Assessment  DOCUMENTATION CODES:   Morbid obesity  INTERVENTION:    Ensure Enlive po BID, each supplement provides 350 kcal and 20 grams of protein  NUTRITION DIAGNOSIS:   Inadequate oral intake related to poor appetite as evidenced by per patient/family report  GOAL:   Patient will meet greater than or equal to 90% of their needs  MONITOR:   PO intake, Supplement acceptance, Labs, Weight trends, I & O's  REASON FOR ASSESSMENT:   Malnutrition Screening Tool  ASSESSMENT:   65 y.o. Female with HTN and osteoarthritis who was felt to be not a candidate for surgery as per the family presented to the ER because of weakness and was found to be bradycardic at home. Patient has been feeling increasingly weak over the last 4 days and was taken to [facility] 2 days ago and was found to be having increase creatinine and was told to stop the lisinopril and was given fluids and discharged home. At home yesterday, patient's home health aide found that patient's heart rate has been dropping down to the 30s. In the ER patient had no bradycardic episodes. But had generalized weakness. Chest x-ray shows pneumonia but patient denies any shortness of breath productive cough fever or chills.   Pt reports she has no appetite. Her husband passes away 5 months ago and since then she's been forcing herself to eat something. No % PO intake records available. Would benefit from nutrition supplements >> pt amenable. Nutrition focused physical exam completed.  No muscle or subcutaneous fat depletion noticed.  Diet Order:  Diet Heart Room service appropriate? Yes; Fluid consistency: Thin  Skin:  Reviewed, no issues  Last BM:  8/30  Height:   Ht Readings from Last 1 Encounters:  12/22/15 5\' 5"  (1.651 m)    Weight:   Wt Readings from Last 1 Encounters:  12/22/15 265 lb 3.4 oz (120.3 kg)    Ideal Body Weight:  56.8 kg  BMI:  Body mass index is 44.13 kg/m.  Estimated  Nutritional Needs:   Kcal:  1800-2000  Protein:  90-100 gm  Fluid:  1.8-2.0 L  EDUCATION NEEDS:   No education needs identified at this time  Arthur Holms, RD, LDN Pager #: [phone] After-Hours Pager #: [phone]

## 2015-12-23 NOTE — Progress Notes (Signed)
Patient sitting in bed, will bring medicine for headache. Call light within reach

## 2015-12-23 NOTE — Progress Notes (Signed)
TELEMETRY: Reviewed telemetry pt in NSR with frequent unifocal PVCs. No bradycardia or pauses.: Vitals:   12/22/15 0432 12/22/15 1300 12/22/15 2210 12/23/15 0440  BP: 118/60 (!) 120/49 126/60 120/75  Pulse: 77 85 71 77  Resp: 18 19 18 18   Temp: 98.1 F (36.7 C) 98.3 F (36.8 C) 98 F (36.7 C) 98 F (36.7 C)  TempSrc: Oral Oral Oral Oral  SpO2: 98% 96% 96% 97%  Weight:      Height:        Intake/Output Summary (Last 24 hours) at 12/23/15 1002 Last data filed at 12/23/15 0442  Gross per 24 hour  Intake              400 ml  Output              900 ml  Net             -500 ml   Filed Weights   12/22/15 0041  Weight: 265 lb 3.4 oz (120.3 kg)    Subjective  Still feels weak, tired and shaky with activity. No dizziness.   . enoxaparin (LOVENOX) injection  60 mg Subcutaneous Q24H  . escitalopram  10 mg Oral Daily  . loratadine  10 mg Oral Daily      LABS: Basic Metabolic Panel:  Recent Labs  12/22/15 0455 12/23/15 0158  NA 138 137  K 4.1 3.7  CL 104 106  CO2 25 24  GLUCOSE 134* 154*  BUN 14 13  CREATININE 1.19* 1.19*  CALCIUM 8.8* 8.8*   Liver Function Tests:  Recent Labs  12/21/15 2050 12/22/15 0455  AST 18 20  ALT 12* 12*  ALKPHOS 68 64  BILITOT 0.7 0.6  PROT 6.6 6.0*  ALBUMIN 3.4* 3.0*   No results for input(s): LIPASE, AMYLASE in the last 72 hours. CBC:  Recent Labs  12/21/15 2050 12/22/15 0455 12/23/15 0158  WBC 7.0 7.0 5.1  NEUTROABS 3.9 4.2  --   HGB 12.7 12.0 11.5*  HCT 40.6 37.7 37.6  MCV 93.3 92.9 93.3  PLT 284 267 275   Cardiac Enzymes:  Recent Labs  12/22/15 0455 12/22/15 1116 12/22/15 1548  CKTOTAL 67  --   --   TROPONINI <0.03 <0.03 <0.03   BNP: No results for input(s): PROBNP in the last 72 hours. D-Dimer: No results for input(s): DDIMER in the last 72 hours. Hemoglobin A1C: No results for input(s): HGBA1C in the last 72 hours. Fasting Lipid Panel:  Recent Labs  12/23/15 0158  CHOL 183  HDL 48  LDLCALC  119*  TRIG 81  CHOLHDL 3.8   Thyroid Function Tests:  Recent Labs  12/22/15 0455  TSH 11.446*     Radiology/Studies:  Dg Chest 2 View  Result Date: 12/21/2015 CLINICAL DATA:  Left arm and chest pain. Low heart rate. Back pain. Symptoms for 3 days. Seen at [facility] yesterday for similar symptoms. EXAM: CHEST  2 VIEW COMPARISON:  None. FINDINGS: Shallow inspiration. Normal heart size and pulmonary vascularity. Increased density in the lung base posteriorly suggesting an infiltrate in the left lung base. Possible pneumonia. No blunting of costophrenic angles. No pneumothorax. Mediastinal contours appear intact. Degenerative changes in the spine and shoulders. IMPRESSION: Increased density in the left lung base posteriorly suggests focal pneumonia. Electronically Signed   By: Lucienne Capers M.D.   On: 12/21/2015 22:00   Ct Chest Wo Contrast  Result Date: 12/22/2015 CLINICAL DATA:  65 year old female with severe left neck shoulder and arm  pain with fever. Abnormal lower lobe opacity on chest radiographs. Initial encounter. EXAM: CT CHEST WITHOUT CONTRAST TECHNIQUE: Multidetector CT imaging of the chest was performed following the standard protocol without IV contrast. COMPARISON:  Chest radiographs 12/21/2015. FINDINGS: Large body habitus. Cardiovascular: Cardiac size at the upper limits of normal. Mild calcified aortic atherosclerosis in the chest. Mild to moderate calcified plaque in the visible upper abdominal aorta. Mild coronary artery calcified plaque is evident in the left coronary distribution. No pericardial effusion. Mediastinum/Nodes: No mediastinal or hilar lymphadenopathy. Moderate-sized gastric hiatal hernia, paraesophageal type. No axillary lymphadenopathy. Lungs/Pleura: Major airways are patent. There is no pulmonary consolidation. The left lung is clear aside from occasional mild subpleural scarring. The right lung is clear. No pleural effusion. Upper Abdomen: Negative visualized  noncontrast liver, spleen, pancreas, adrenal glands and kidneys. Negative visualized large and small intestine in the upper abdomen. Musculoskeletal: Advanced degenerative changes at the left sternoclavicular joint. Very severe degenerative changes at the left glenohumeral joint with severe joint space loss and bulky osteophytosis as seen on coronal image 58. Moderate to severe mid and lower thoracic chronic disc degeneration with endplate spurring. No acute osseous abnormality identified. IMPRESSION: 1. Negative lungs. No acute pulmonary findings. The lower lobe density on the recent chest radiographs seems to have been artifact. 2. Severe degenerative changes at the left glenohumeral joint. Moderate severe degenerative changes at the left sternoclavicular joint. Moderate to severe mid and lower thoracic degenerative changes. 3. Calcified aortic and coronary artery atherosclerosis. 4. Moderate size hiatal hernia. Electronically Signed   By: Genevie Ann M.D.   On: 12/22/2015 07:50    PHYSICAL EXAM General: Pleasant, obese older Caucasian female, NAD Psych: Normal affect. Neuro: Alert and oriented X 3. Moves all extremities spontaneously. HEENT: Normal                     Neck: Supple without bruits or JVD. Lungs:  Resp regular and unlabored, CTA. Heart: RRR no s3, s4, or murmurs. Abdomen: Soft, non-tender, non-distended, BS + x 4.  Extremities: No clubbing, cyanosis or edema. DP/PT/Radials 2+ and equal bilaterally.  ASSESSMENT AND PLAN: 1. Generalized weakness/fatigue. Multifactorial with dehydration, deconditioning, ?hypothyroidism. Doubt significant cardiac component. Will follow up on Echo today 2. PVCs. Frequent and unifocal. Keep potassium >4 and magnesium > 2. Avoid stimulants. Correct thyroid abnormality. If EF normal by Echo I would not treat PVCs. I don't think symptoms correlate with PVCs.   Present on Admission: . CAP (community acquired pneumonia) . Essential hypertension . Pneumonia .  PVC's (premature ventricular contractions)   Signed, Mcarthur Ivins [place], [place] 12/23/2015 10:02 AM

## 2015-12-23 NOTE — Progress Notes (Signed)
  Echocardiogram 2D Echocardiogram has been performed.  Cheryl Lane 12/23/2015, 4:28 PM

## 2015-12-24 LAB — BASIC METABOLIC PANEL
ANION GAP: 7 (ref 5–15)
BUN: 12 mg/dL (ref 6–20)
CHLORIDE: 106 mmol/L (ref 101–111)
CO2: 26 mmol/L (ref 22–32)
CREATININE: 1.24 mg/dL — AB (ref 0.44–1.00)
Calcium: 8.9 mg/dL (ref 8.9–10.3)
GFR calc non Af Amer: 44 mL/min — ABNORMAL LOW (ref >60)
GFR, EST AFRICAN AMERICAN: 51 mL/min — AB (ref >60)
Glucose, Bld: 105 mg/dL — ABNORMAL HIGH (ref 65–99)
Potassium: 4.1 mmol/L (ref 3.5–5.1)
Sodium: 139 mmol/L (ref 135–145)

## 2015-12-24 LAB — T3: T3, Total: 105 ng/dL (ref 71–180)

## 2015-12-24 LAB — MAGNESIUM: Magnesium: 1.9 mg/dL (ref 1.7–2.4)

## 2015-12-24 MED ORDER — LISINOPRIL 10 MG PO TABS: 10.0000 mg | ORAL_TABLET | Freq: Every evening | ORAL | 0 refills | Status: DC

## 2015-12-24 NOTE — Care Management Note (Signed)
Case Management Note Marvetta Gibbons RN, BSN Unit 2W-Case Manager [phone]  Patient Details  Name: Cheryl Lane MRN: [id number] Date of Birth: 1950/12/30  Subjective/Objective:   Pt admitted with weakness  And CAP                Action/Plan: PTA pt was staying with her daughter and was active for [facility] PT/OT with [facility] (formally [facility])- will need resumption orders for [facility] services when ready for discharge, spoke with pt and other daughter at bedside- and confirmed plan to return home with [facility] at this time.   Expected Discharge Date:    12/24/15              Expected Discharge Plan:  [place]  In-House Referral:     Discharge planning Services  CM Consult  Post Acute Care Choice:  Resumption of Svcs/PTA Provider, Home Health Choice offered to:  Patient  DME Arranged:    DME Agency:     HH Arranged:  PT, OT, RN [place] Agency:  [facility]  Status of Service:  Completed, signed off  If discussed at [place] of Stay Meetings, dates discussed:    Additional Comments:  12/24/15-1030- Marvetta Gibbons RN, CM- pt discharge today- will resume [place] services with [facility]- have notified Abby with [facility]- will add HHRN to the PT/OT services already provided. Orders have been placed.   Dawayne Patricia, RN 12/24/2015, 10:31 AM

## 2015-12-24 NOTE — Progress Notes (Signed)
Hospital Problem List     Principal Problem:   Weakness generalized Active Problems:   CAP (community acquired pneumonia)   Essential hypertension   Pneumonia   PVC's (premature ventricular contractions)   Left shoulder pain   Opacity of lung on imaging study   Shoulder pain, acute     Patient Profile:   Primary Cardiologist: [place] - Dr. [place]   65 yo female w/ PMH of HTN and OA who presented to [facility] on 12/21/2015 for generalized weakness. Cards consulted for dizziness and bradycardia.   Subjective   Still feels weak but says dizziness has improved. Ambulated with PT yesterday who recommended Home Health PT.  Inpatient Medications    . enoxaparin (LOVENOX) injection  60 mg Subcutaneous Q24H  . escitalopram  10 mg Oral Daily  . feeding supplement (ENSURE ENLIVE)  237 mL Oral BID BM  . levothyroxine  50 mcg Oral QAC breakfast  . loratadine  10 mg Oral Daily    Vital Signs    Vitals:   12/23/15 0440 12/23/15 1805 12/23/15 2021 12/24/15 0504  BP: 120/75 134/70 112/66 (!) 125/59  Pulse: 77 78 77 68  Resp: 18 18 18 18   Temp: 98 F (36.7 C) 98 F (36.7 C) 98.2 F (36.8 C) 98 F (36.7 C)  TempSrc: Oral Oral Oral Oral  SpO2: 97% 94% 99% 97%  Weight:      Height:        Intake/Output Summary (Last 24 hours) at 12/24/15 0831 Last data filed at 12/24/15 0302  Gross per 24 hour  Intake             1050 ml  Output              250 ml  Net              800 ml   Filed Weights   12/22/15 0041  Weight: 265 lb 3.4 oz (120.3 kg)    Physical Exam    General: Obese Caucasian female appearing in no acute distress. Head: Normocephalic, atraumatic.  Neck: Supple without bruits, JVD not elevated. Lungs:  Resp regular and unlabored, CTA without wheezing or rales. Heart: RRR, S1, S2, no S3, S4, or murmur; no rub. Abdomen: Soft, non-tender, non-distended with normoactive bowel sounds. No hepatomegaly. No rebound/guarding. No obvious abdominal masses. Extremities: No  clubbing, cyanosis, or edema. Distal pedal pulses are 2+ bilaterally. Neuro: Alert and oriented X 3. Moves all extremities spontaneously. Psych: Normal affect.  Labs    CBC  Recent Labs  12/21/15 2050 12/22/15 0455 12/23/15 0158  WBC 7.0 7.0 5.1  NEUTROABS 3.9 4.2  --   HGB 12.7 12.0 11.5*  HCT 40.6 37.7 37.6  MCV 93.3 92.9 93.3  PLT 284 267 [id number]   Basic Metabolic Panel  Recent Labs  12/23/15 0158 12/23/15 1028 12/24/15 0424  NA 137  --  139  K 3.7  --  4.1  CL 106  --  106  CO2 24  --  26  GLUCOSE 154*  --  105*  BUN 13  --  12  CREATININE 1.19*  --  1.24*  CALCIUM 8.8*  --  8.9  MG  --  1.7 1.9   Liver Function Tests  Recent Labs  12/21/15 2050 12/22/15 0455  AST 18 20  ALT 12* 12*  ALKPHOS 68 64  BILITOT 0.7 0.6  PROT 6.6 6.0*  ALBUMIN 3.4* 3.0*   No results for input(s): LIPASE, AMYLASE in  the last 72 hours. Cardiac Enzymes  Recent Labs  12/22/15 0455 12/22/15 1116 12/22/15 1548  CKTOTAL 67  --   --   TROPONINI <0.03 <0.03 <0.03   BNP Invalid input(s): POCBNP D-Dimer No results for input(s): DDIMER in the last 72 hours. Hemoglobin A1C No results for input(s): HGBA1C in the last 72 hours. Fasting Lipid Panel  Recent Labs  12/23/15 0158  CHOL 183  HDL 48  LDLCALC 119*  TRIG 81  CHOLHDL 3.8   Thyroid Function Tests  Recent Labs  12/22/15 0455  TSH 11.446*    Telemetry    NSR, HR in 60's - 80's. Frequent PVC's with episodes of Ventricular Bigeminy.   ECG    No new tracings.    Cardiac Studies and Radiology    Dg Chest 2 View  Result Date: 12/21/2015 CLINICAL DATA:  Left arm and chest pain. Low heart rate. Back pain. Symptoms for 3 days. Seen at [facility] yesterday for similar symptoms. EXAM: CHEST  2 VIEW COMPARISON:  None. FINDINGS: Shallow inspiration. Normal heart size and pulmonary vascularity. Increased density in the lung base posteriorly suggesting an infiltrate in the left lung base. Possible pneumonia. No  blunting of costophrenic angles. No pneumothorax. Mediastinal contours appear intact. Degenerative changes in the spine and shoulders. IMPRESSION: Increased density in the left lung base posteriorly suggests focal pneumonia. Electronically Signed   By: Lucienne Capers M.D.   On: 12/21/2015 22:00   Ct Chest Wo Contrast  Result Date: 12/22/2015 CLINICAL DATA:  65 year old female with severe left neck shoulder and arm pain with fever. Abnormal lower lobe opacity on chest radiographs. Initial encounter. EXAM: CT CHEST WITHOUT CONTRAST TECHNIQUE: Multidetector CT imaging of the chest was performed following the standard protocol without IV contrast. COMPARISON:  Chest radiographs 12/21/2015. FINDINGS: Large body habitus. Cardiovascular: Cardiac size at the upper limits of normal. Mild calcified aortic atherosclerosis in the chest. Mild to moderate calcified plaque in the visible upper abdominal aorta. Mild coronary artery calcified plaque is evident in the left coronary distribution. No pericardial effusion. Mediastinum/Nodes: No mediastinal or hilar lymphadenopathy. Moderate-sized gastric hiatal hernia, paraesophageal type. No axillary lymphadenopathy. Lungs/Pleura: Major airways are patent. There is no pulmonary consolidation. The left lung is clear aside from occasional mild subpleural scarring. The right lung is clear. No pleural effusion. Upper Abdomen: Negative visualized noncontrast liver, spleen, pancreas, adrenal glands and kidneys. Negative visualized large and small intestine in the upper abdomen. Musculoskeletal: Advanced degenerative changes at the left sternoclavicular joint. Very severe degenerative changes at the left glenohumeral joint with severe joint space loss and bulky osteophytosis as seen on coronal image 58. Moderate to severe mid and lower thoracic chronic disc degeneration with endplate spurring. No acute osseous abnormality identified. IMPRESSION: 1. Negative lungs. No acute pulmonary  findings. The lower lobe density on the recent chest radiographs seems to have been artifact. 2. Severe degenerative changes at the left glenohumeral joint. Moderate severe degenerative changes at the left sternoclavicular joint. Moderate to severe mid and lower thoracic degenerative changes. 3. Calcified aortic and coronary artery atherosclerosis. 4. Moderate size hiatal hernia. Electronically Signed   By: Genevie Ann M.D.   On: 12/22/2015 07:50    Echocardiogram: 12/23/2015 Study Conclusions  - Left ventricle: The cavity size was normal. There was mild   concentric hypertrophy. Systolic function was normal. The   estimated ejection fraction was in the range of 60% to 65%.   Doppler parameters are consistent with abnormal left ventricular   relaxation (grade  1 diastolic dysfunction). There was no evidence   of elevated ventricular filling pressure by Doppler parameters. - Aortic valve: Structurally normal valve. There was no   regurgitation. - Mitral valve: Structurally normal valve. There was no   regurgitation. - Left atrium: The atrium was mildly dilated. - Right ventricle: The cavity size was normal. Wall thickness was   normal. Systolic function was normal. - Right atrium: The atrium was normal in size. - Tricuspid valve: There was trivial regurgitation. - Pulmonary arteries: Systolic pressure was within the normal   range. - Pericardium, extracardiac: The pericardium was normal in   appearance.  Assessment & Plan    1. Weakness/dizziness - In review of her telemetry, her lowest heart rate noted is in the 60's. Does report dizziness with position changes during admission. Was treated with gentle IV hydration. It is difficult to know if her heart was was truly in the 30s while at home, given only the use of a pulse ox. Likely home pulse ox undercounted HR due to frequent PVC's.  - cyclic troponin values have been negative and EKG is without acute ischemic changes. - echocardiogram  shows preserved EF of 60-65% with Grade 1 DD.   2. PVC's/ Ectopy - frequent PVC's. Does have occasional episodes of ventricular bigeminy.  - Mg 1.9 and K+ 4.1. Recommend treatment of TSH. - with normal EF, would not initiate BB therapy with reported bradycardia.  3. AKI - creatinine has been variable between 1.19 - 1.24 this admission. No previous labs available for comparison.   4. HTN - BP well-controlled with PTA Lisinopril and HCTZ being held.   5. Elevated TSH - 11.446 this admission. Possibly contributing to worsening weakness and fatigue - per admitting team   Signed, Erma Heritage , PA-C 8:31 AM 12/24/2015 Pager: [phone] Patient seen and examined and history reviewed. Agree with above findings and plan. Monitor still showing PVCs. No bradycardia or pauses. Echo reviewed and is normal. Her PVCs are benign and asymptomatic so I would not treat at this pont. Correct electrolytes and thyroid abnormality. No further cardiac work up planned. We will sign off.   Peter [place], [place] 12/24/2015 9:15 AM

## 2015-12-24 NOTE — Care Management Important Message (Signed)
Important Message  Patient Details  Name: Cheryl Lane MRN: [id number] Date of Birth: 05/08/1950   Medicare Important Message Given:  Yes    Emelda Kohlbeck [place] 12/24/2015, 10:05 AM

## 2015-12-24 NOTE — Discharge Summary (Addendum)
Physician Discharge Summary  Cheryl Lane [id number] DOB: 09-16-1950 DOA: 12/21/2015  PCP: Nicoletta Dress, MD  Admit date: 12/21/2015 Discharge date: 12/24/2015  Admitted From: Home Disposition: Home with her daughter  Recommendations for Outpatient Follow-up:  1. Follow up with PCP in 1-2 weeks 2. Please obtain BMP/CBC in one week. 3. If symptoms recur again, consider Holter monitoring. 4. Lisinopril and HCTZ both discontinued. 5. Check TFTs (TSH, free T4 and total T3) in 4 weeks.  Home Health: Yes, PT/OT Equipment/Devices: N/A  Discharge Condition: Stable CODE STATUS: Full code Diet recommendation: Heart Healthy  Brief/Interim Summary: Cheryl Lane is a 65 y.o. female with hypertension and osteoarthritis who was felt to be not a candidate for surgery as per the family presents to the ER because of weakness and was found to be bradycardic at home. Patient has been feeling increasingly weak over the last 4 days and was taken to [facility] 2 days ago and was found to be having increase creatinine and was told to stop the lisinopril and was given fluids and discharged home. At home yesterday, patient's home health aide found that patient's heart rate has been dropping down to the 30s. In the ER patient had no bradycardic episodes. But had generalized weakness. Chest x-ray shows pneumonia but patient denies any shortness of breath productive cough fever or chills. Patient has noticed over the last 4 days increasing left shoulder pain radiating from the neck. Patient has chronic left shoulder arthritis for which patient has been following up with orthopedic surgeon. Patient states the pain has worsened. Patient also has chronic bilateral knee pain.  Discharge Diagnoses:  Principal Problem:   Weakness generalized Active Problems:   CAP (community acquired pneumonia)   Essential hypertension   Pneumonia   PVC's (premature ventricular contractions)   Left shoulder pain    Opacity of lung on imaging study   Shoulder pain, acute   Bradycardia -Seen by cardiology, reportedly heart rate was in the 30s reported by ENT. -Patient is on telemetry, heart rate was never less than 60 in the hospital with multiple PVCs. -2-D echocardiogram showed normal ejection fraction and grade 1 diastolic dysfunction. -Per cardiology no further workup. -Recommend if she develops same symptoms to refer to cardiology as outpatient might need Holter monitor.  Generalized weakness -Will get physical therapy consult and gently hydrate. Hold lisinopril for now.  -TSH is 11.446, has normal free T4 and total T3, hold starting any thyroid replacement supplements. -Check TSH, free T4 and total T3 in 4 weeks if the same can start low-dose Synthroid.  Possible acute renal failure from dehydration -Presented with creatinine of 1.2, patient hydrated very well for 2 days with IV fluids. 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 -Lisinopril and HCTZ discontinued  Possible community acquired pneumonia -Placed on Rocephin and Zithromax based on x-ray findings, CT scanning did not show any pneumonia. -Patient does not have any fever or sputum production we'll discontinue antibiotics.  Left shoulder and upper chest pain -Has chronic left shoulder pain, CT scan showed severe arthritic changes.  Hypertension  -Daughter had some concern that her blood pressure might be aggressively controlled. -Patient blood pressure was appropriate during this hospital stay without taking lisinopril or HCTZ in the hospital. -I discontinued both medications on discharge, asked daughter to take BP daily and shaw the log to her PCP.  History of arthritis involving left shoulder and bilateral knees follows up with Dr.  Swintek.  Discharge Instructions  Discharge Instructions    Diet - low sodium heart healthy    Complete by:  As directed    Increase activity slowly    Complete by:  As directed       Medication List    STOP taking these medications   hydrochlorothiazide 25 MG tablet Commonly known as:  HYDRODIURIL   lisinopril 20 MG tablet Commonly known as:  PRINIVIL,ZESTRIL     TAKE these medications   cetirizine 10 MG tablet Commonly known as:  ZYRTEC Take 10 mg by mouth daily. Notes to patient:  Not administered in hospital, please continue typical home regimen   escitalopram 10 MG tablet Commonly known as:  LEXAPRO Take 10 mg by mouth daily.   traMADol 50 MG tablet Commonly known as:  ULTRAM Take 50 mg by mouth every 6 (six) hours as needed for moderate pain.       No Known Allergies  Consultations:  None   Procedures/Studies: Dg Chest 2 View  Result Date: 12/21/2015 CLINICAL DATA:  Left arm and chest pain. Low heart rate. Back pain. Symptoms for 3 days. Seen at [facility] yesterday for similar symptoms. EXAM: CHEST  2 VIEW COMPARISON:  None. FINDINGS: Shallow inspiration. Normal heart size and pulmonary vascularity. Increased density in the lung base posteriorly suggesting an infiltrate in the left lung base. Possible pneumonia. No blunting of costophrenic angles. No pneumothorax. Mediastinal contours appear intact. Degenerative changes in the spine and shoulders. IMPRESSION: Increased density in the left lung base posteriorly suggests focal pneumonia. Electronically Signed   By: Lucienne Capers M.D.   On: 12/21/2015 22:00   Ct Chest Wo Contrast  Result Date: 12/22/2015 CLINICAL DATA:  65 year old female with severe left neck shoulder and arm pain with fever. Abnormal lower lobe opacity on chest radiographs. Initial encounter. EXAM: CT CHEST WITHOUT CONTRAST TECHNIQUE: Multidetector CT imaging of the chest was performed following the standard protocol without IV contrast. COMPARISON:  Chest radiographs 12/21/2015. FINDINGS: Large body habitus. Cardiovascular: Cardiac size at the upper limits of  normal. Mild calcified aortic atherosclerosis in the chest. Mild to moderate calcified plaque in the visible upper abdominal aorta. Mild coronary artery calcified plaque is evident in the left coronary distribution. No pericardial effusion. Mediastinum/Nodes: No mediastinal or hilar lymphadenopathy. Moderate-sized gastric hiatal hernia, paraesophageal type. No axillary lymphadenopathy. Lungs/Pleura: Major airways are patent. There is no pulmonary consolidation. The left lung is clear aside from occasional mild subpleural scarring. The right lung is clear. No pleural effusion. Upper Abdomen: Negative visualized noncontrast liver, spleen, pancreas, adrenal glands and kidneys. Negative visualized large and small intestine in the upper abdomen. Musculoskeletal: Advanced degenerative changes at the left sternoclavicular joint. Very severe degenerative changes at the left glenohumeral joint with severe joint space loss and bulky osteophytosis as seen on coronal image 58. Moderate to severe mid and lower thoracic chronic disc degeneration with endplate spurring. No acute osseous abnormality identified. IMPRESSION: 1. Negative lungs. No acute pulmonary findings. The lower lobe density on the recent chest radiographs seems to have been artifact. 2. Severe degenerative changes at the left glenohumeral joint. Moderate severe degenerative changes at the left sternoclavicular joint. Moderate to severe mid and lower thoracic degenerative changes. 3. Calcified aortic and coronary artery atherosclerosis. 4. Moderate size hiatal hernia. Electronically Signed   By: Genevie Ann M.D.   On: 12/22/2015 07:50   (Echo, Carotid, EGD, Colonoscopy, ERCP)    Subjective:   Discharge Exam: Vitals:   12/23/15 2021 12/24/15 [id number]  BP: 112/66 (!) 125/59  Pulse: 77 68  Resp: 18 18  Temp: 98.2 F (36.8 C) 98 F (36.7 C)   Vitals:   12/23/15 0440 12/23/15 1805 12/23/15 2021 12/24/15 0504  BP: 120/75 134/70 112/66 (!) 125/59  Pulse: 77  78 77 68  Resp: 18 18 18 18   Temp: 98 F (36.7 C) 98 F (36.7 C) 98.2 F (36.8 C) 98 F (36.7 C)  TempSrc: Oral Oral Oral Oral  SpO2: 97% 94% 99% 97%  Weight:      Height:        General: Pt is alert, awake, not in acute distress Cardiovascular: RRR, S1/S2 +, no rubs, no gallops Respiratory: CTA bilaterally, no wheezing, no rhonchi Abdominal: Soft, NT, ND, bowel sounds + Extremities: no edema, no cyanosis    The results of significant diagnostics from this hospitalization (including imaging, microbiology, ancillary and laboratory) are listed below for reference.     Microbiology: Recent Results (from the past 240 hour(s))  Culture, blood (routine x 2)     Status: None (Preliminary result)   Collection Time: 12/22/15 12:02 AM  Result Value Ref Range Status   Specimen Description BLOOD LEFT WRIST  Final   Special Requests BOTTLES DRAWN AEROBIC AND ANAEROBIC 5ML  Final   Culture NO GROWTH 1 DAY  Final   Report Status PENDING  Incomplete  Culture, blood (routine x 2)     Status: None (Preliminary result)   Collection Time: 12/22/15 12:05 AM  Result Value Ref Range Status   Specimen Description BLOOD RIGHT HAND  Final   Special Requests BOTTLES DRAWN AEROBIC AND ANAEROBIC 5ML  Final   Culture NO GROWTH 1 DAY  Final   Report Status PENDING  Incomplete     Labs: BNP (last 3 results) No results for input(s): BNP in the last 8760 hours. Basic Metabolic Panel:  Recent Labs Lab 12/21/15 2050 12/22/15 0455 12/23/15 0158 12/23/15 1028 12/24/15 0424  NA 136 138 137  --  139  K 3.5 4.1 3.7  --  4.1  CL 104 104 106  --  106  CO2 25 25 24   --  26  GLUCOSE 106* 134* 154*  --  105*  BUN 16 14 13   --  12  CREATININE 1.23* 1.19* 1.19*  --  1.24*  CALCIUM 9.1 8.8* 8.8*  --  8.9  MG  --   --   --  1.7 1.9   Liver Function Tests:  Recent Labs Lab 12/21/15 2050 12/22/15 0455  AST 18 20  ALT 12* 12*  ALKPHOS 68 64  BILITOT 0.7 0.6  PROT 6.6 6.0*  ALBUMIN 3.4* 3.0*    No results for input(s): LIPASE, AMYLASE in the last 168 hours. No results for input(s): AMMONIA in the last 168 hours. CBC:  Recent Labs Lab 12/21/15 2050 12/22/15 0455 12/23/15 0158  WBC 7.0 7.0 5.1  NEUTROABS 3.9 4.2  --   HGB 12.7 12.0 11.5*  HCT 40.6 37.7 37.6  MCV 93.3 92.9 93.3  PLT 284 267 275   Cardiac Enzymes:  Recent Labs Lab 12/22/15 0455 12/22/15 1116 12/22/15 1548  CKTOTAL 67  --   --   TROPONINI <0.03 <0.03 <0.03   BNP: Invalid input(s): POCBNP CBG: No results for input(s): GLUCAP in the last 168 hours. D-Dimer No results for input(s): DDIMER in the last 72 hours. Hgb A1c No results for input(s): HGBA1C in the last 72 hours. Lipid Profile  Recent Labs  12/23/15 0158  CHOL 183  HDL 48  LDLCALC 119*  TRIG 81  CHOLHDL 3.8   Thyroid function studies  Recent Labs  12/22/15 0455  TSH 11.446*   Anemia work up No results for input(s): VITAMINB12, FOLATE, FERRITIN, TIBC, IRON, RETICCTPCT in the last 72 hours. Urinalysis No results found for: COLORURINE, APPEARANCEUR, LABSPEC, [place], GLUCOSEU, [place], BILIRUBINUR, [place], PROTEINUR, UROBILINOGEN, NITRITE, LEUKOCYTESUR Sepsis Labs Invalid input(s): PROCALCITONIN,  WBC,  LACTICIDVEN Microbiology Recent Results (from the past 240 hour(s))  Culture, blood (routine x 2)     Status: None (Preliminary result)   Collection Time: 12/22/15 12:02 AM  Result Value Ref Range Status   Specimen Description BLOOD LEFT WRIST  Final   Special Requests BOTTLES DRAWN AEROBIC AND ANAEROBIC 5ML  Final   Culture NO GROWTH 1 DAY  Final   Report Status PENDING  Incomplete  Culture, blood (routine x 2)     Status: None (Preliminary result)   Collection Time: 12/22/15 12:05 AM  Result Value Ref Range Status   Specimen Description BLOOD RIGHT HAND  Final   Special Requests BOTTLES DRAWN AEROBIC AND ANAEROBIC 5ML  Final   Culture NO GROWTH 1 DAY  Final   Report Status PENDING  Incomplete     Time  coordinating discharge: Over 30 minutes  SIGNED:   Birdie Hopes, MD  [facility] 12/24/2015, 9:59 AM Pager   If 7PM-7AM, please contact night-coverage [email or web address] Password [email or web address]

## 2015-12-27 LAB — CULTURE, BLOOD (ROUTINE X 2)
CULTURE: NO GROWTH
Culture: NO GROWTH

## 2015-12-29 DIAGNOSIS — M25512 Pain in left shoulder: Secondary | ICD-10-CM | POA: Diagnosis not present

## 2015-12-29 DIAGNOSIS — G8929 Other chronic pain: Secondary | ICD-10-CM | POA: Diagnosis not present

## 2015-12-29 DIAGNOSIS — R079 Chest pain, unspecified: Secondary | ICD-10-CM | POA: Diagnosis not present

## 2015-12-29 DIAGNOSIS — M79603 Pain in arm, unspecified: Secondary | ICD-10-CM | POA: Diagnosis not present

## 2015-12-29 DIAGNOSIS — M6281 Muscle weakness (generalized): Secondary | ICD-10-CM | POA: Diagnosis not present

## 2015-12-29 DIAGNOSIS — R001 Bradycardia, unspecified: Secondary | ICD-10-CM | POA: Diagnosis not present

## 2015-12-29 DIAGNOSIS — F4320 Adjustment disorder, unspecified: Secondary | ICD-10-CM | POA: Diagnosis not present

## 2015-12-29 DIAGNOSIS — F329 Major depressive disorder, single episode, unspecified: Secondary | ICD-10-CM | POA: Diagnosis not present

## 2015-12-29 DIAGNOSIS — M19012 Primary osteoarthritis, left shoulder: Secondary | ICD-10-CM | POA: Diagnosis not present

## 2015-12-29 DIAGNOSIS — R2689 Other abnormalities of gait and mobility: Secondary | ICD-10-CM | POA: Diagnosis not present

## 2015-12-29 DIAGNOSIS — M5412 Radiculopathy, cervical region: Secondary | ICD-10-CM | POA: Diagnosis not present

## 2015-12-29 DIAGNOSIS — R531 Weakness: Secondary | ICD-10-CM | POA: Diagnosis not present

## 2015-12-29 DIAGNOSIS — I10 Essential (primary) hypertension: Secondary | ICD-10-CM | POA: Diagnosis not present

## 2015-12-29 DIAGNOSIS — E119 Type 2 diabetes mellitus without complications: Secondary | ICD-10-CM | POA: Diagnosis not present

## 2016-01-04 DIAGNOSIS — M6281 Muscle weakness (generalized): Secondary | ICD-10-CM | POA: Diagnosis not present

## 2016-01-04 DIAGNOSIS — M19012 Primary osteoarthritis, left shoulder: Secondary | ICD-10-CM | POA: Diagnosis not present

## 2016-01-04 DIAGNOSIS — I10 Essential (primary) hypertension: Secondary | ICD-10-CM | POA: Diagnosis not present

## 2016-01-04 DIAGNOSIS — E119 Type 2 diabetes mellitus without complications: Secondary | ICD-10-CM | POA: Diagnosis not present

## 2016-01-04 DIAGNOSIS — F329 Major depressive disorder, single episode, unspecified: Secondary | ICD-10-CM | POA: Diagnosis not present

## 2016-01-04 DIAGNOSIS — R2689 Other abnormalities of gait and mobility: Secondary | ICD-10-CM | POA: Diagnosis not present

## 2016-01-05 DIAGNOSIS — I10 Essential (primary) hypertension: Secondary | ICD-10-CM | POA: Diagnosis not present

## 2016-01-05 DIAGNOSIS — M6281 Muscle weakness (generalized): Secondary | ICD-10-CM | POA: Diagnosis not present

## 2016-01-05 DIAGNOSIS — R2689 Other abnormalities of gait and mobility: Secondary | ICD-10-CM | POA: Diagnosis not present

## 2016-01-05 DIAGNOSIS — F329 Major depressive disorder, single episode, unspecified: Secondary | ICD-10-CM | POA: Diagnosis not present

## 2016-01-05 DIAGNOSIS — M19012 Primary osteoarthritis, left shoulder: Secondary | ICD-10-CM | POA: Diagnosis not present

## 2016-01-05 DIAGNOSIS — E119 Type 2 diabetes mellitus without complications: Secondary | ICD-10-CM | POA: Diagnosis not present

## 2016-01-06 DIAGNOSIS — F329 Major depressive disorder, single episode, unspecified: Secondary | ICD-10-CM | POA: Diagnosis not present

## 2016-01-06 DIAGNOSIS — M6281 Muscle weakness (generalized): Secondary | ICD-10-CM | POA: Diagnosis not present

## 2016-01-06 DIAGNOSIS — E119 Type 2 diabetes mellitus without complications: Secondary | ICD-10-CM | POA: Diagnosis not present

## 2016-01-06 DIAGNOSIS — I10 Essential (primary) hypertension: Secondary | ICD-10-CM | POA: Diagnosis not present

## 2016-01-06 DIAGNOSIS — M19012 Primary osteoarthritis, left shoulder: Secondary | ICD-10-CM | POA: Diagnosis not present

## 2016-01-06 DIAGNOSIS — R2689 Other abnormalities of gait and mobility: Secondary | ICD-10-CM | POA: Diagnosis not present

## 2016-01-10 DIAGNOSIS — F329 Major depressive disorder, single episode, unspecified: Secondary | ICD-10-CM | POA: Diagnosis not present

## 2016-01-10 DIAGNOSIS — I10 Essential (primary) hypertension: Secondary | ICD-10-CM | POA: Diagnosis not present

## 2016-01-10 DIAGNOSIS — R2689 Other abnormalities of gait and mobility: Secondary | ICD-10-CM | POA: Diagnosis not present

## 2016-01-10 DIAGNOSIS — M6281 Muscle weakness (generalized): Secondary | ICD-10-CM | POA: Diagnosis not present

## 2016-01-10 DIAGNOSIS — E119 Type 2 diabetes mellitus without complications: Secondary | ICD-10-CM | POA: Diagnosis not present

## 2016-01-10 DIAGNOSIS — M19012 Primary osteoarthritis, left shoulder: Secondary | ICD-10-CM | POA: Diagnosis not present

## 2016-01-12 DIAGNOSIS — M6281 Muscle weakness (generalized): Secondary | ICD-10-CM | POA: Diagnosis not present

## 2016-01-12 DIAGNOSIS — F329 Major depressive disorder, single episode, unspecified: Secondary | ICD-10-CM | POA: Diagnosis not present

## 2016-01-12 DIAGNOSIS — I10 Essential (primary) hypertension: Secondary | ICD-10-CM | POA: Diagnosis not present

## 2016-01-12 DIAGNOSIS — M19012 Primary osteoarthritis, left shoulder: Secondary | ICD-10-CM | POA: Diagnosis not present

## 2016-01-12 DIAGNOSIS — E119 Type 2 diabetes mellitus without complications: Secondary | ICD-10-CM | POA: Diagnosis not present

## 2016-01-12 DIAGNOSIS — R2689 Other abnormalities of gait and mobility: Secondary | ICD-10-CM | POA: Diagnosis not present

## 2016-01-13 DIAGNOSIS — E119 Type 2 diabetes mellitus without complications: Secondary | ICD-10-CM | POA: Diagnosis not present

## 2016-01-13 DIAGNOSIS — M6281 Muscle weakness (generalized): Secondary | ICD-10-CM | POA: Diagnosis not present

## 2016-01-13 DIAGNOSIS — I10 Essential (primary) hypertension: Secondary | ICD-10-CM | POA: Diagnosis not present

## 2016-01-13 DIAGNOSIS — F329 Major depressive disorder, single episode, unspecified: Secondary | ICD-10-CM | POA: Diagnosis not present

## 2016-01-13 DIAGNOSIS — M19012 Primary osteoarthritis, left shoulder: Secondary | ICD-10-CM | POA: Diagnosis not present

## 2016-01-13 DIAGNOSIS — R2689 Other abnormalities of gait and mobility: Secondary | ICD-10-CM | POA: Diagnosis not present

## 2016-01-17 DIAGNOSIS — F329 Major depressive disorder, single episode, unspecified: Secondary | ICD-10-CM | POA: Diagnosis not present

## 2016-01-17 DIAGNOSIS — I10 Essential (primary) hypertension: Secondary | ICD-10-CM | POA: Diagnosis not present

## 2016-01-17 DIAGNOSIS — M19012 Primary osteoarthritis, left shoulder: Secondary | ICD-10-CM | POA: Diagnosis not present

## 2016-01-17 DIAGNOSIS — R2689 Other abnormalities of gait and mobility: Secondary | ICD-10-CM | POA: Diagnosis not present

## 2016-01-17 DIAGNOSIS — M6281 Muscle weakness (generalized): Secondary | ICD-10-CM | POA: Diagnosis not present

## 2016-01-17 DIAGNOSIS — E119 Type 2 diabetes mellitus without complications: Secondary | ICD-10-CM | POA: Diagnosis not present

## 2016-01-19 DIAGNOSIS — I10 Essential (primary) hypertension: Secondary | ICD-10-CM | POA: Diagnosis not present

## 2016-01-19 DIAGNOSIS — M19012 Primary osteoarthritis, left shoulder: Secondary | ICD-10-CM | POA: Diagnosis not present

## 2016-01-19 DIAGNOSIS — R2689 Other abnormalities of gait and mobility: Secondary | ICD-10-CM | POA: Diagnosis not present

## 2016-01-19 DIAGNOSIS — M6281 Muscle weakness (generalized): Secondary | ICD-10-CM | POA: Diagnosis not present

## 2016-01-19 DIAGNOSIS — E119 Type 2 diabetes mellitus without complications: Secondary | ICD-10-CM | POA: Diagnosis not present

## 2016-01-19 DIAGNOSIS — F329 Major depressive disorder, single episode, unspecified: Secondary | ICD-10-CM | POA: Diagnosis not present

## 2016-01-24 DIAGNOSIS — F329 Major depressive disorder, single episode, unspecified: Secondary | ICD-10-CM | POA: Diagnosis not present

## 2016-01-24 DIAGNOSIS — R2689 Other abnormalities of gait and mobility: Secondary | ICD-10-CM | POA: Diagnosis not present

## 2016-01-24 DIAGNOSIS — I10 Essential (primary) hypertension: Secondary | ICD-10-CM | POA: Diagnosis not present

## 2016-01-24 DIAGNOSIS — M19012 Primary osteoarthritis, left shoulder: Secondary | ICD-10-CM | POA: Diagnosis not present

## 2016-01-24 DIAGNOSIS — E119 Type 2 diabetes mellitus without complications: Secondary | ICD-10-CM | POA: Diagnosis not present

## 2016-01-24 DIAGNOSIS — M6281 Muscle weakness (generalized): Secondary | ICD-10-CM | POA: Diagnosis not present

## 2016-01-25 DIAGNOSIS — E119 Type 2 diabetes mellitus without complications: Secondary | ICD-10-CM | POA: Diagnosis not present

## 2016-01-25 DIAGNOSIS — F329 Major depressive disorder, single episode, unspecified: Secondary | ICD-10-CM | POA: Diagnosis not present

## 2016-01-25 DIAGNOSIS — I10 Essential (primary) hypertension: Secondary | ICD-10-CM | POA: Diagnosis not present

## 2016-01-25 DIAGNOSIS — M19012 Primary osteoarthritis, left shoulder: Secondary | ICD-10-CM | POA: Diagnosis not present

## 2016-01-25 DIAGNOSIS — R2689 Other abnormalities of gait and mobility: Secondary | ICD-10-CM | POA: Diagnosis not present

## 2016-01-25 DIAGNOSIS — M6281 Muscle weakness (generalized): Secondary | ICD-10-CM | POA: Diagnosis not present

## 2016-01-28 DIAGNOSIS — R2689 Other abnormalities of gait and mobility: Secondary | ICD-10-CM | POA: Diagnosis not present

## 2016-01-28 DIAGNOSIS — M6281 Muscle weakness (generalized): Secondary | ICD-10-CM | POA: Diagnosis not present

## 2016-01-28 DIAGNOSIS — M19012 Primary osteoarthritis, left shoulder: Secondary | ICD-10-CM | POA: Diagnosis not present

## 2016-01-28 DIAGNOSIS — E119 Type 2 diabetes mellitus without complications: Secondary | ICD-10-CM | POA: Diagnosis not present

## 2016-01-28 DIAGNOSIS — F329 Major depressive disorder, single episode, unspecified: Secondary | ICD-10-CM | POA: Diagnosis not present

## 2016-01-28 DIAGNOSIS — I10 Essential (primary) hypertension: Secondary | ICD-10-CM | POA: Diagnosis not present

## 2016-02-25 ENCOUNTER — Emergency Department (HOSPITAL_BASED_OUTPATIENT_CLINIC_OR_DEPARTMENT_OTHER)
Admission: EM | Admit: 2016-02-25 | Discharge: 2016-02-25 | Disposition: A | Discharge status: Home / Self Care | Payer: Medicare Other | Attending: Emergency Medicine | Admitting: Emergency Medicine

## 2016-02-25 ENCOUNTER — Emergency Department (HOSPITAL_BASED_OUTPATIENT_CLINIC_OR_DEPARTMENT_OTHER): Payer: Medicare Other

## 2016-02-25 ENCOUNTER — Encounter (HOSPITAL_BASED_OUTPATIENT_CLINIC_OR_DEPARTMENT_OTHER): Payer: Self-pay | Admitting: Emergency Medicine

## 2016-02-25 DIAGNOSIS — M79671 Pain in right foot: Secondary | ICD-10-CM | POA: Diagnosis not present

## 2016-02-25 DIAGNOSIS — M791 Myalgia: Secondary | ICD-10-CM | POA: Insufficient documentation

## 2016-02-25 DIAGNOSIS — I10 Essential (primary) hypertension: Secondary | ICD-10-CM | POA: Insufficient documentation

## 2016-02-25 DIAGNOSIS — M25571 Pain in right ankle and joints of right foot: Secondary | ICD-10-CM | POA: Insufficient documentation

## 2016-02-25 DIAGNOSIS — Z79899 Other long term (current) drug therapy: Secondary | ICD-10-CM | POA: Insufficient documentation

## 2016-02-25 HISTORY — DX: Other chronic pain: G89.29

## 2016-02-25 HISTORY — DX: Pain in left shoulder: M25.512

## 2016-02-25 HISTORY — DX: Radiculopathy, cervical region: M54.12

## 2016-02-25 NOTE — ED Triage Notes (Signed)
Right foot pain x 2 days.  Unknown injury.  Reports that she had a bad dream. No swelling or bruising noted.

## 2016-02-25 NOTE — ED Notes (Signed)
ED Provider at bedside. 

## 2016-02-25 NOTE — ED Provider Notes (Signed)
[facility] Provider Note   CSN: [id number] Arrival date & time: 02/25/16  2114  By signing my name below, I, Cheryl Lane, attest that this documentation has been prepared under the direction and in the presence of non-physician practitioner, Etta Quill, NP. Electronically Signed: Jeanell Lane, Scribe. 02/25/2016. 10:25 PM.  History   Chief Complaint Chief Complaint  Patient presents with  . Foot Pain   The history is provided by the patient. No language interpreter was used.   HPI Comments: Cheryl Lane is a 65 y.o. female who presents to the Emergency Department complaining of constant moderate right foot pain that started about 2 days ago. She states she is unsure of any known injury. She describes the pain as exacerbated by movement. She denies any other complaints.   Past Medical History:  Diagnosis Date  . Cervical radiculopathy   . Chronic pain in left shoulder   . Hypertension     Patient Active Problem List   Diagnosis Date Noted  . Left shoulder pain   . Opacity of lung on imaging study   . Shoulder pain, acute   . Weakness generalized 12/22/2015  . Essential hypertension 12/22/2015  . Pneumonia 12/22/2015  . PVC's (premature ventricular contractions) 12/22/2015  . CAP (community acquired pneumonia) 12/21/2015    Past Surgical History:  Procedure Laterality Date  . APPENDECTOMY      OB History    No data available       Home Medications    Prior to Admission medications   Medication Sig Start Date End Date Taking? Authorizing Provider  lisinopril (PRINIVIL,ZESTRIL) 10 MG tablet Take 10 mg by mouth daily.   Yes Historical Provider, MD  cetirizine (ZYRTEC) 10 MG tablet Take 10 mg by mouth daily.    Historical Provider, MD  escitalopram (LEXAPRO) 10 MG tablet Take 10 mg by mouth daily.    Historical Provider, MD  traMADol (ULTRAM) 50 MG tablet Take 50 mg by mouth every 6 (six) hours as needed for moderate pain.    Historical Provider,  MD    Family History Family History  Problem Relation Age of Onset  . Hypertension Other     Social History Social History  Substance Use Topics  . Smoking status: Never Smoker  . Smokeless tobacco: Never Used  . Alcohol use No     Allergies   Review of patient's allergies indicates no known allergies.   Review of Systems Review of Systems  Musculoskeletal: Positive for myalgias.  All other systems reviewed and are negative.    Physical Exam Updated Vital Signs BP 156/71 (BP Location: Left Arm)   Pulse 84   Temp 98.2 F (36.8 C) (Oral)   Resp 20   Ht 5\' 5"  (1.651 m)   Wt 118.8 kg   SpO2 98%   BMI 43.60 kg/m   Physical Exam  Constitutional: She appears well-developed and well-nourished. No distress.  HENT:  Head: Normocephalic and atraumatic.  Eyes: Conjunctivae are normal.  Neck: Neck supple.  Cardiovascular: Normal rate and regular rhythm.   No murmur heard. Pulmonary/Chest: Effort normal and breath sounds normal. No respiratory distress.  Abdominal: Soft. There is no tenderness.  Musculoskeletal: She exhibits no edema.  TTP to the distal malleolus. Swelling noted to the lateral malleolus.   Neurological: She is alert.  Skin: Skin is warm and dry.  Psychiatric: She has a normal mood and affect.  Nursing note and vitals reviewed.    ED Treatments / Results  Labs (  all labs ordered are listed, but only abnormal results are displayed) Labs Reviewed - No data to display  EKG  EKG Interpretation None       Radiology Dg Ankle 2 Views Right  Result Date: 02/25/2016 CLINICAL DATA:  Nontraumatic pain. EXAM: RIGHT ANKLE - 2 VIEW COMPARISON:  None. FINDINGS: Negative for acute fracture or dislocation. Abundant ossification at the Achilles insertion, consistent with sequela of remote trauma or inflammation. No bone lesion or bony destruction. Plantar calcaneal spur incidentally noted. IMPRESSION: No acute findings. Plantar calcaneal spur. Ossification  of the Achilles insertion consistent with remote trauma or inflammation. Electronically Signed   By: Andreas Newport M.D.   On: 02/25/2016 23:16   Dg Foot Complete Right  Result Date: 02/25/2016 CLINICAL DATA:  Awakened 2 nights ago with foot pain toward the heel. EXAM: RIGHT FOOT COMPLETE - 3+ VIEW COMPARISON:  None. FINDINGS: Negative for acute fracture, dislocation or radiopaque foreign body. There is a plantar calcaneal spur. There is abundant ossification at the Achilles insertion on the calcaneus. This likely is sequela of remote trauma or inflammation. No significant arthritic changes. No bone lesion or bony destruction. IMPRESSION: Ossification of the Achilles insertion on the calcaneus is likely sequela of remote inflammation or trauma. Plantar calcaneal spur. No acute findings are evident. Electronically Signed   By: Andreas Newport M.D.   On: 02/25/2016 22:30    Procedures Procedures (including critical care time)  Medications Ordered in ED Medications - No data to display   Initial Impression / Assessment and Plan / ED Course  I have reviewed the triage vital signs and the nursing notes.  Pertinent labs & imaging results that were available during my care of the patient were reviewed by me and considered in my medical decision making (see chart for details).  Clinical Course  Patient X-Ray negative for obvious fracture or dislocation.  Pt advised to follow up with orthopedics. Patient given splint while in ED, conservative therapy recommended and discussed. Patient will be discharged home & is agreeable with above plan. Returns precautions discussed. Pt appears safe for discharge.    Final Clinical Impressions(s) / ED Diagnoses   Final diagnoses:  Acute right ankle pain    New Prescriptions New Prescriptions   No medications on file   I personally performed the services described in this documentation, which was scribed in my presence. The recorded information has  been reviewed and is accurate.     Etta Quill, NP 02/25/16 2340    Veryl Speak, MD 02/25/16 [id number]

## 2016-02-29 DIAGNOSIS — Z1231 Encounter for screening mammogram for malignant neoplasm of breast: Secondary | ICD-10-CM | POA: Diagnosis not present

## 2016-02-29 DIAGNOSIS — Z23 Encounter for immunization: Secondary | ICD-10-CM | POA: Diagnosis not present

## 2016-02-29 DIAGNOSIS — R946 Abnormal results of thyroid function studies: Secondary | ICD-10-CM | POA: Diagnosis not present

## 2016-02-29 DIAGNOSIS — Z6841 Body Mass Index (BMI) 40.0 and over, adult: Secondary | ICD-10-CM | POA: Diagnosis not present

## 2016-02-29 DIAGNOSIS — N183 Chronic kidney disease, stage 3 (moderate): Secondary | ICD-10-CM | POA: Diagnosis not present

## 2016-02-29 DIAGNOSIS — I129 Hypertensive chronic kidney disease with stage 1 through stage 4 chronic kidney disease, or unspecified chronic kidney disease: Secondary | ICD-10-CM | POA: Diagnosis not present

## 2016-02-29 DIAGNOSIS — E119 Type 2 diabetes mellitus without complications: Secondary | ICD-10-CM | POA: Diagnosis not present

## 2016-02-29 DIAGNOSIS — F329 Major depressive disorder, single episode, unspecified: Secondary | ICD-10-CM | POA: Diagnosis not present

## 2016-02-29 DIAGNOSIS — E785 Hyperlipidemia, unspecified: Secondary | ICD-10-CM | POA: Diagnosis not present

## 2016-04-06 DIAGNOSIS — Z6841 Body Mass Index (BMI) 40.0 and over, adult: Secondary | ICD-10-CM | POA: Diagnosis not present

## 2016-04-06 DIAGNOSIS — M109 Gout, unspecified: Secondary | ICD-10-CM | POA: Diagnosis not present

## 2016-05-26 DIAGNOSIS — J101 Influenza due to other identified influenza virus with other respiratory manifestations: Secondary | ICD-10-CM | POA: Diagnosis not present

## 2016-05-26 DIAGNOSIS — J209 Acute bronchitis, unspecified: Secondary | ICD-10-CM | POA: Diagnosis not present

## 2016-05-29 ENCOUNTER — Inpatient Hospital Stay (HOSPITAL_BASED_OUTPATIENT_CLINIC_OR_DEPARTMENT_OTHER)
Admission: EM | Admit: 2016-05-29 | Discharge: 2016-06-05 | DRG: 202 | Disposition: A | Discharge status: Skilled Nursing Facility | Payer: Medicare Other | Attending: Internal Medicine | Admitting: Internal Medicine

## 2016-05-29 ENCOUNTER — Encounter (HOSPITAL_BASED_OUTPATIENT_CLINIC_OR_DEPARTMENT_OTHER): Payer: Self-pay | Admitting: *Deleted

## 2016-05-29 ENCOUNTER — Emergency Department (HOSPITAL_BASED_OUTPATIENT_CLINIC_OR_DEPARTMENT_OTHER): Payer: Medicare Other

## 2016-05-29 DIAGNOSIS — M5412 Radiculopathy, cervical region: Secondary | ICD-10-CM | POA: Diagnosis present

## 2016-05-29 DIAGNOSIS — Z8719 Personal history of other diseases of the digestive system: Secondary | ICD-10-CM

## 2016-05-29 DIAGNOSIS — F419 Anxiety disorder, unspecified: Secondary | ICD-10-CM | POA: Diagnosis present

## 2016-05-29 DIAGNOSIS — J441 Chronic obstructive pulmonary disease with (acute) exacerbation: Secondary | ICD-10-CM

## 2016-05-29 DIAGNOSIS — Z6841 Body Mass Index (BMI) 40.0 and over, adult: Secondary | ICD-10-CM | POA: Diagnosis not present

## 2016-05-29 DIAGNOSIS — Z792 Long term (current) use of antibiotics: Secondary | ICD-10-CM

## 2016-05-29 DIAGNOSIS — J205 Acute bronchitis due to respiratory syncytial virus: Secondary | ICD-10-CM | POA: Diagnosis not present

## 2016-05-29 DIAGNOSIS — R531 Weakness: Secondary | ICD-10-CM | POA: Diagnosis present

## 2016-05-29 DIAGNOSIS — E669 Obesity, unspecified: Secondary | ICD-10-CM | POA: Diagnosis present

## 2016-05-29 DIAGNOSIS — F32A Depression, unspecified: Secondary | ICD-10-CM

## 2016-05-29 DIAGNOSIS — R0603 Acute respiratory distress: Secondary | ICD-10-CM

## 2016-05-29 DIAGNOSIS — J9601 Acute respiratory failure with hypoxia: Secondary | ICD-10-CM | POA: Diagnosis not present

## 2016-05-29 DIAGNOSIS — N183 Chronic kidney disease, stage 3 unspecified: Secondary | ICD-10-CM

## 2016-05-29 DIAGNOSIS — N184 Chronic kidney disease, stage 4 (severe): Secondary | ICD-10-CM | POA: Diagnosis present

## 2016-05-29 DIAGNOSIS — J111 Influenza due to unidentified influenza virus with other respiratory manifestations: Secondary | ICD-10-CM

## 2016-05-29 DIAGNOSIS — G894 Chronic pain syndrome: Secondary | ICD-10-CM | POA: Diagnosis present

## 2016-05-29 DIAGNOSIS — Z79899 Other long term (current) drug therapy: Secondary | ICD-10-CM

## 2016-05-29 DIAGNOSIS — J40 Bronchitis, not specified as acute or chronic: Secondary | ICD-10-CM | POA: Diagnosis present

## 2016-05-29 DIAGNOSIS — I129 Hypertensive chronic kidney disease with stage 1 through stage 4 chronic kidney disease, or unspecified chronic kidney disease: Secondary | ICD-10-CM | POA: Diagnosis present

## 2016-05-29 DIAGNOSIS — J4521 Mild intermittent asthma with (acute) exacerbation: Secondary | ICD-10-CM

## 2016-05-29 DIAGNOSIS — R079 Chest pain, unspecified: Secondary | ICD-10-CM | POA: Diagnosis not present

## 2016-05-29 DIAGNOSIS — Z8249 Family history of ischemic heart disease and other diseases of the circulatory system: Secondary | ICD-10-CM

## 2016-05-29 DIAGNOSIS — M25512 Pain in left shoulder: Secondary | ICD-10-CM | POA: Diagnosis present

## 2016-05-29 DIAGNOSIS — J189 Pneumonia, unspecified organism: Secondary | ICD-10-CM | POA: Diagnosis present

## 2016-05-29 DIAGNOSIS — R05 Cough: Secondary | ICD-10-CM | POA: Diagnosis not present

## 2016-05-29 DIAGNOSIS — F329 Major depressive disorder, single episode, unspecified: Secondary | ICD-10-CM | POA: Diagnosis present

## 2016-05-29 DIAGNOSIS — Z79891 Long term (current) use of opiate analgesic: Secondary | ICD-10-CM

## 2016-05-29 DIAGNOSIS — R0602 Shortness of breath: Secondary | ICD-10-CM | POA: Diagnosis not present

## 2016-05-29 DIAGNOSIS — I10 Essential (primary) hypertension: Secondary | ICD-10-CM | POA: Diagnosis present

## 2016-05-29 DIAGNOSIS — J1100 Influenza due to unidentified influenza virus with unspecified type of pneumonia: Secondary | ICD-10-CM | POA: Diagnosis present

## 2016-05-29 HISTORY — DX: Chronic kidney disease, unspecified: N18.9

## 2016-05-29 HISTORY — DX: Unspecified asthma, uncomplicated: J45.909

## 2016-05-29 HISTORY — DX: Personal history of other diseases of the digestive system: Z87.19

## 2016-05-29 LAB — COMPREHENSIVE METABOLIC PANEL
ALT: 10 U/L — ABNORMAL LOW (ref 14–54)
ANION GAP: 10 (ref 5–15)
AST: 22 U/L (ref 15–41)
Albumin: 3.8 g/dL (ref 3.5–5.0)
Alkaline Phosphatase: 65 U/L (ref 38–126)
BILIRUBIN TOTAL: 0.8 mg/dL (ref 0.3–1.2)
BUN: 15 mg/dL (ref 6–20)
CALCIUM: 8.8 mg/dL — AB (ref 8.9–10.3)
CO2: 26 mmol/L (ref 22–32)
Chloride: 102 mmol/L (ref 101–111)
Creatinine, Ser: 1.22 mg/dL — ABNORMAL HIGH (ref 0.44–1.00)
GFR calc Af Amer: 52 mL/min — ABNORMAL LOW (ref >60)
GFR, EST NON AFRICAN AMERICAN: 44 mL/min — AB (ref >60)
Glucose, Bld: 113 mg/dL — ABNORMAL HIGH (ref 65–99)
POTASSIUM: 3.4 mmol/L — AB (ref 3.5–5.1)
Sodium: 138 mmol/L (ref 135–145)
TOTAL PROTEIN: 7.7 g/dL (ref 6.5–8.1)

## 2016-05-29 LAB — CBC WITH DIFFERENTIAL/PLATELET
Basophils Absolute: 0 10*3/uL (ref 0.0–0.1)
Basophils Relative: 0 %
Eosinophils Absolute: 0.2 10*3/uL (ref 0.0–0.7)
Eosinophils Relative: 4 %
HEMATOCRIT: 41.3 % (ref 36.0–46.0)
Hemoglobin: 13.4 g/dL (ref 12.0–15.0)
LYMPHS ABS: 1.5 10*3/uL (ref 0.7–4.0)
LYMPHS PCT: 38 %
MCH: 28.4 pg (ref 26.0–34.0)
MCHC: 32.4 g/dL (ref 30.0–36.0)
MCV: 87.5 fL (ref 78.0–100.0)
MONO ABS: 0.6 10*3/uL (ref 0.1–1.0)
MONOS PCT: 16 %
NEUTROS ABS: 1.6 10*3/uL — AB (ref 1.7–7.7)
Neutrophils Relative %: 42 %
Platelets: 251 10*3/uL (ref 150–400)
RBC: 4.72 MIL/uL (ref 3.87–5.11)
RDW: 15.9 % — AB (ref 11.5–15.5)
WBC: 3.9 10*3/uL — ABNORMAL LOW (ref 4.0–10.5)

## 2016-05-29 LAB — TROPONIN I: Troponin I: <0.03 ng/mL (ref <0.03)

## 2016-05-29 LAB — CBC AND DIFFERENTIAL
HEMATOCRIT: 41 % (ref 36–46)
Hemoglobin: 13.4 g/dL (ref 12.0–16.0)
Platelets: 251 10*3/uL (ref 150–399)
WBC: 3.9 10^3/mL

## 2016-05-29 LAB — BASIC METABOLIC PANEL
BUN: 15 mg/dL (ref 4–21)
CREATININE: 1.2 mg/dL — AB (ref 0.5–1.1)
GLUCOSE: 113 mg/dL
Potassium: 3.4 mmol/L (ref 3.4–5.3)
Sodium: 138 mmol/L (ref 137–147)

## 2016-05-29 LAB — HEPATIC FUNCTION PANEL
ALT: 10 U/L (ref 7–35)
AST: 22 U/L (ref 13–35)
Alkaline Phosphatase: 65 U/L (ref 25–125)
BILIRUBIN, TOTAL: 0.8 mg/dL

## 2016-05-29 MED ORDER — IPRATROPIUM BROMIDE 0.02 % IN SOLN
RESPIRATORY_TRACT | Status: AC
  Administered 2016-05-29: 0.5 mg via RESPIRATORY_TRACT
  Filled 2016-05-29: qty 2.5

## 2016-05-29 MED ORDER — ALBUTEROL SULFATE (2.5 MG/3ML) 0.083% IN NEBU
INHALATION_SOLUTION | RESPIRATORY_TRACT | Status: AC
  Administered 2016-05-29: 5 mg via RESPIRATORY_TRACT
  Filled 2016-05-29: qty 6

## 2016-05-29 MED ORDER — IPRATROPIUM BROMIDE 0.02 % IN SOLN
0.5000 mg | Freq: Once | RESPIRATORY_TRACT | Status: AC
  Administered 2016-05-29: 0.5 mg via RESPIRATORY_TRACT
  Filled 2016-05-29: qty 2.5

## 2016-05-29 MED ORDER — ALBUTEROL (5 MG/ML) CONTINUOUS INHALATION SOLN
15.0000 mg/h | INHALATION_SOLUTION | Freq: Once | RESPIRATORY_TRACT | Status: AC
  Administered 2016-05-29: 15 mg/h via RESPIRATORY_TRACT
  Filled 2016-05-29: qty 20

## 2016-05-29 MED ORDER — IPRATROPIUM BROMIDE 0.02 % IN SOLN
0.5000 mg | RESPIRATORY_TRACT | Status: AC
  Administered 2016-05-29: 0.5 mg via RESPIRATORY_TRACT

## 2016-05-29 MED ORDER — MAGNESIUM SULFATE 2 GM/50ML IV SOLN
2.0000 g | Freq: Once | INTRAVENOUS | Status: AC
  Administered 2016-05-29: 2 g via INTRAVENOUS
  Filled 2016-05-29: qty 50

## 2016-05-29 MED ORDER — ALBUTEROL SULFATE (2.5 MG/3ML) 0.083% IN NEBU: 5.0000 mg | INHALATION_SOLUTION | Freq: Once | RESPIRATORY_TRACT | Status: DC

## 2016-05-29 MED ORDER — SODIUM CHLORIDE 0.9 % IV BOLUS (SEPSIS)
1000.0000 mL | Freq: Once | INTRAVENOUS | Status: AC
  Administered 2016-05-29: 1000 mL via INTRAVENOUS

## 2016-05-29 MED ORDER — METHYLPREDNISOLONE SODIUM SUCC 125 MG IJ SOLR
125.0000 mg | Freq: Once | INTRAMUSCULAR | Status: AC
  Administered 2016-05-29: 125 mg via INTRAVENOUS
  Filled 2016-05-29: qty 2

## 2016-05-29 MED ORDER — ALBUTEROL SULFATE (2.5 MG/3ML) 0.083% IN NEBU
5.0000 mg | INHALATION_SOLUTION | RESPIRATORY_TRACT | Status: AC
  Administered 2016-05-29: 5 mg via RESPIRATORY_TRACT

## 2016-05-29 MED ORDER — IPRATROPIUM BROMIDE 0.02 % IN SOLN: 0.5000 mg | Freq: Once | RESPIRATORY_TRACT | Status: DC

## 2016-05-29 NOTE — ED Triage Notes (Signed)
Pt c/o SOB x 2 days, flu like symptoms x 1 week, DX flu last week.

## 2016-05-29 NOTE — ED Notes (Signed)
EDP notified pt unable to ambulate d/t "I feel worse", coughing, SOB and weakness. EDP states will see pt

## 2016-05-29 NOTE — Progress Notes (Signed)
66yo with recent diagnosis of influenza presenting with cough, SOB.   H/o HTN.   Since diagnosis, prescribed Tamiflu and eventually Augmentin.  Using Albuterol and other treatments without relief.   Vital signs were all normal upon presentation. Normal labs. X-rays - bronchitis, no pneumonia. Wheezing throughout, responded to continuous nebulizer treatment but since then has been persistently tachycardic with pulse to 120s.  Unable to ambulate due to SOB. Finished continuous neb about 1 hour ago.   Will place in obs-med surg.  She is likely to be stable for discharge tomorrow unless something changes.  Carlyon Shadow, M.D.

## 2016-05-29 NOTE — ED Provider Notes (Signed)
[facility] DEPT [facility] Provider Note   CSN: [id number] Arrival date & time: 05/29/16  2993   By signing my name below, I, Delton Prairie, attest that this documentation has been prepared under the direction and in the presence of Drenda Freeze, MD  Electronically Signed: Delton Prairie, ED Scribe. 05/29/16. 8:16 PM.   History   Chief Complaint Chief Complaint  Patient presents with  . Influenza   The history is provided by the patient. No language interpreter was used.   HPI Comments:  Cheryl Lane is a 66 y.o. female, with a hx of HTN on lisinopril, who presents to the Emergency Department complaining of SOB onset yesterday. Pt also reports chest heaviness, persistent cough and fatigue. Pt visited her PCP 1 week ago and was diagnosed with the flu. She was prescribed and has taken augmentin, Tamiflu, Z-pack and has used her albuterol inhaler (4-5 times a day) with no significant relief. She has had a breathing treatment in the ED with significant relief. No hx of bronchitis and COPD. No other associated symptoms noted.   Past Medical History:  Diagnosis Date  . Cervical radiculopathy   . Chronic pain in left shoulder   . Hypertension     Patient Active Problem List   Diagnosis Date Noted  . Left shoulder pain   . Opacity of lung on imaging study   . Shoulder pain, acute   . Weakness generalized 12/22/2015  . Essential hypertension 12/22/2015  . Pneumonia 12/22/2015  . PVC's (premature ventricular contractions) 12/22/2015  . CAP (community acquired pneumonia) 12/21/2015    Past Surgical History:  Procedure Laterality Date  . APPENDECTOMY      OB History    No data available       Home Medications    Prior to Admission medications   Medication Sig Start Date End Date Taking? Authorizing Provider  albuterol (PROVENTIL HFA;VENTOLIN HFA) 108 (90 Base) MCG/ACT inhaler Inhale into the lungs every 6 (six) hours as needed for wheezing or shortness of breath.   Yes  Historical Provider, MD  azithromycin (ZITHROMAX) 500 MG tablet Take by mouth daily.   Yes Historical Provider, MD  oseltamivir (TAMIFLU) 75 MG capsule Take 75 mg by mouth.   Yes Historical Provider, MD  cetirizine (ZYRTEC) 10 MG tablet Take 10 mg by mouth daily.    Historical Provider, MD  escitalopram (LEXAPRO) 10 MG tablet Take 10 mg by mouth daily.    Historical Provider, MD  lisinopril (PRINIVIL,ZESTRIL) 10 MG tablet Take 10 mg by mouth daily.    Historical Provider, MD  traMADol (ULTRAM) 50 MG tablet Take 50 mg by mouth every 6 (six) hours as needed for moderate pain.    Historical Provider, MD    Family History Family History  Problem Relation Age of Onset  . Hypertension Other     Social History Social History  Substance Use Topics  . Smoking status: Never Smoker  . Smokeless tobacco: Never Used  . Alcohol use No     Allergies   Patient has no known allergies.   Review of Systems Review of Systems  Constitutional: Positive for fatigue.  Respiratory: Positive for cough, shortness of breath and wheezing.   All other systems reviewed and are negative.  Physical Exam Updated Vital Signs BP 115/99   Pulse 87   Temp 99.1 F (37.3 C)   Resp 20   Ht 5\' 7"  (1.702 m)   Wt 258 lb (117 kg)   SpO2 93%  BMI 40.41 kg/m   Physical Exam  Constitutional: She is oriented to person, place, and time. She appears well-developed and well-nourished. No distress.  HENT:  Head: Normocephalic and atraumatic.  Eyes: EOM are normal.  Neck: Normal range of motion.  Cardiovascular: Normal rate, regular rhythm and normal heart sounds.   Pulmonary/Chest: Effort normal. She has wheezes.  Abdominal: Soft. She exhibits no distension. There is no tenderness.  Musculoskeletal: Normal range of motion.  Neurological: She is alert and oriented to person, place, and time.  Skin: Skin is warm and dry.  Psychiatric: She has a normal mood and affect. Judgment normal.  Nursing note and vitals  reviewed.   ED Treatments / Results  DIAGNOSTIC STUDIES:  Oxygen Saturation is 93% on RA, adequate by my interpretation.    COORDINATION OF CARE:  8:15 PM Discussed treatment plan with pt at bedside and pt agreed to plan.  Labs (all labs ordered are listed, but only abnormal results are displayed) Labs Reviewed  CBC WITH DIFFERENTIAL/PLATELET - Abnormal; Notable for the following:       Result Value   WBC 3.9 (*)    RDW 15.9 (*)    Neutro Abs 1.6 (*)    All other components within normal limits  COMPREHENSIVE METABOLIC PANEL    EKG  EKG Interpretation None       Radiology Dg Chest 2 View  Result Date: 05/29/2016 CLINICAL DATA:  66 year old who states she was diagnosed with influenza 1 week ago, presenting now with worsening cough and shortness of breath associated with mid chest pain. EXAM: CHEST  2 VIEW COMPARISON:  12/29/2015, 12/21/2015 and earlier, including CT chest 12/22/2015. FINDINGS: Cardiac silhouette normal in size, unchanged. Thoracic aorta tortuous and atherosclerotic, unchanged. Moderate-sized hiatal hernia, unchanged. Hilar and mediastinal contours otherwise unremarkable. Mild to moderate central peribronchial thickening, more so than on the most recent prior examination. Lungs otherwise clear. No localized airspace consolidation. No pleural effusions. No pneumothorax. Normal pulmonary vascularity. Degenerative changes involving the thoracic spine. IMPRESSION: 1. Mild-to-moderate changes of acute bronchitis and/or asthma without focal airspace pneumonia. 2. Moderate-sized hiatal hernia, unchanged. Electronically Signed   By: Evangeline Dakin M.D.   On: 05/29/2016 20:04    Procedures Procedures (including critical care time)  CRITICAL CARE Performed by: Wandra Arthurs   Total critical care time:30 minutes  Critical care time was exclusive of separately billable procedures and treating other patients.  Critical care was necessary to treat or prevent imminent  or life-threatening deterioration.  Critical care was time spent personally by me on the following activities: development of treatment plan with patient and/or surrogate as well as nursing, discussions with consultants, evaluation of patient's response to treatment, examination of patient, obtaining history from patient or surrogate, ordering and performing treatments and interventions, ordering and review of laboratory studies, ordering and review of radiographic studies, pulse oximetry and re-evaluation of patient's condition.   Medications Ordered in ED Medications  albuterol (PROVENTIL) (2.5 MG/3ML) 0.083% nebulizer solution 5 mg (5 mg Nebulization Given 05/29/16 1923)  ipratropium (ATROVENT) nebulizer solution 0.5 mg (0.5 mg Nebulization Given 05/29/16 1923)     Initial Impression / Assessment and Plan / ED Course  I have reviewed the triage vital signs and the nursing notes.  Pertinent labs & imaging results that were available during my care of the patient were reviewed by me and considered in my medical decision making (see chart for details).     Cheryl Lane is a 66 y.o. female here with  SOB. Recently diagnosed with flu. Tachypneic in the ED. Diffuse wheezing throughout. Concerned for bronchitis vs pneumonia. Will get labs, CXR. Will give nebs, steroids.   10:45 PM After continuous neb, she became tachycardic and tachypneic. Waited about an hour and still unable to ambulate and still short of breath. Has some wheezing still. Given solumedrol. Will order magnesium. Still unable to ambulate. Will admit for observation for bronchitis.    Final Clinical Impressions(s) / ED Diagnoses   Final diagnoses:  None    New Prescriptions New Prescriptions   No medications on file  I personally performed the services described in this documentation, which was scribed in my presence. The recorded information has been reviewed and is accurate.     Drenda Freeze, MD 05/29/16  2251

## 2016-05-30 ENCOUNTER — Encounter (HOSPITAL_COMMUNITY): Payer: Self-pay | Admitting: Physician Assistant

## 2016-05-30 DIAGNOSIS — M5412 Radiculopathy, cervical region: Secondary | ICD-10-CM | POA: Diagnosis present

## 2016-05-30 DIAGNOSIS — F329 Major depressive disorder, single episode, unspecified: Secondary | ICD-10-CM

## 2016-05-30 DIAGNOSIS — N184 Chronic kidney disease, stage 4 (severe): Secondary | ICD-10-CM

## 2016-05-30 DIAGNOSIS — R066 Hiccough: Secondary | ICD-10-CM | POA: Diagnosis not present

## 2016-05-30 DIAGNOSIS — Z8249 Family history of ischemic heart disease and other diseases of the circulatory system: Secondary | ICD-10-CM | POA: Diagnosis not present

## 2016-05-30 DIAGNOSIS — R262 Difficulty in walking, not elsewhere classified: Secondary | ICD-10-CM | POA: Diagnosis not present

## 2016-05-30 DIAGNOSIS — R531 Weakness: Secondary | ICD-10-CM | POA: Diagnosis present

## 2016-05-30 DIAGNOSIS — Z792 Long term (current) use of antibiotics: Secondary | ICD-10-CM | POA: Diagnosis not present

## 2016-05-30 DIAGNOSIS — N182 Chronic kidney disease, stage 2 (mild): Secondary | ICD-10-CM | POA: Diagnosis not present

## 2016-05-30 DIAGNOSIS — Z79899 Other long term (current) drug therapy: Secondary | ICD-10-CM | POA: Diagnosis not present

## 2016-05-30 DIAGNOSIS — F419 Anxiety disorder, unspecified: Secondary | ICD-10-CM | POA: Diagnosis present

## 2016-05-30 DIAGNOSIS — J205 Acute bronchitis due to respiratory syncytial virus: Secondary | ICD-10-CM | POA: Diagnosis not present

## 2016-05-30 DIAGNOSIS — R488 Other symbolic dysfunctions: Secondary | ICD-10-CM | POA: Diagnosis not present

## 2016-05-30 DIAGNOSIS — G894 Chronic pain syndrome: Secondary | ICD-10-CM

## 2016-05-30 DIAGNOSIS — Z79891 Long term (current) use of opiate analgesic: Secondary | ICD-10-CM | POA: Diagnosis not present

## 2016-05-30 DIAGNOSIS — J4521 Mild intermittent asthma with (acute) exacerbation: Secondary | ICD-10-CM | POA: Diagnosis not present

## 2016-05-30 DIAGNOSIS — R0602 Shortness of breath: Secondary | ICD-10-CM | POA: Diagnosis not present

## 2016-05-30 DIAGNOSIS — R0603 Acute respiratory distress: Secondary | ICD-10-CM

## 2016-05-30 DIAGNOSIS — I10 Essential (primary) hypertension: Secondary | ICD-10-CM | POA: Diagnosis not present

## 2016-05-30 DIAGNOSIS — F411 Generalized anxiety disorder: Secondary | ICD-10-CM | POA: Diagnosis not present

## 2016-05-30 DIAGNOSIS — M6281 Muscle weakness (generalized): Secondary | ICD-10-CM | POA: Diagnosis not present

## 2016-05-30 DIAGNOSIS — J1100 Influenza due to unidentified influenza virus with unspecified type of pneumonia: Secondary | ICD-10-CM | POA: Diagnosis present

## 2016-05-30 DIAGNOSIS — E669 Obesity, unspecified: Secondary | ICD-10-CM | POA: Diagnosis present

## 2016-05-30 DIAGNOSIS — R0600 Dyspnea, unspecified: Secondary | ICD-10-CM

## 2016-05-30 DIAGNOSIS — I129 Hypertensive chronic kidney disease with stage 1 through stage 4 chronic kidney disease, or unspecified chronic kidney disease: Secondary | ICD-10-CM | POA: Diagnosis present

## 2016-05-30 DIAGNOSIS — N183 Chronic kidney disease, stage 3 unspecified: Secondary | ICD-10-CM

## 2016-05-30 DIAGNOSIS — J210 Acute bronchiolitis due to respiratory syncytial virus: Secondary | ICD-10-CM | POA: Diagnosis not present

## 2016-05-30 DIAGNOSIS — F32A Depression, unspecified: Secondary | ICD-10-CM

## 2016-05-30 DIAGNOSIS — Z6841 Body Mass Index (BMI) 40.0 and over, adult: Secondary | ICD-10-CM | POA: Diagnosis not present

## 2016-05-30 DIAGNOSIS — J9601 Acute respiratory failure with hypoxia: Secondary | ICD-10-CM | POA: Diagnosis present

## 2016-05-30 DIAGNOSIS — J40 Bronchitis, not specified as acute or chronic: Secondary | ICD-10-CM | POA: Diagnosis not present

## 2016-05-30 HISTORY — DX: Chronic kidney disease, stage 4 (severe): N18.4

## 2016-05-30 LAB — RESPIRATORY PANEL BY PCR
Adenovirus: NOT DETECTED
Bordetella pertussis: NOT DETECTED
CHLAMYDOPHILA PNEUMONIAE-RVPPCR: NOT DETECTED
CORONAVIRUS 229E-RVPPCR: NOT DETECTED
CORONAVIRUS HKU1-RVPPCR: NOT DETECTED
CORONAVIRUS OC43-RVPPCR: NOT DETECTED
Coronavirus NL63: NOT DETECTED
INFLUENZA A-RVPPCR: NOT DETECTED
Influenza B: NOT DETECTED
MYCOPLASMA PNEUMONIAE-RVPPCR: NOT DETECTED
Metapneumovirus: NOT DETECTED
PARAINFLUENZA VIRUS 1-RVPPCR: NOT DETECTED
Parainfluenza Virus 2: NOT DETECTED
Parainfluenza Virus 3: NOT DETECTED
Parainfluenza Virus 4: NOT DETECTED
Respiratory Syncytial Virus: DETECTED — AB
Rhinovirus / Enterovirus: NOT DETECTED

## 2016-05-30 MED ORDER — ACETAMINOPHEN 650 MG RE SUPP: 650.0000 mg | Freq: Four times a day (QID) | RECTAL | Status: DC | PRN

## 2016-05-30 MED ORDER — HYDROCOD POLST-CPM POLST ER 10-8 MG/5ML PO SUER
5.0000 mL | Freq: Once | ORAL | Status: AC
  Administered 2016-05-30: 5 mL via ORAL
  Filled 2016-05-30: qty 5

## 2016-05-30 MED ORDER — ACETAMINOPHEN 325 MG PO TABS
650.0000 mg | ORAL_TABLET | Freq: Four times a day (QID) | ORAL | Status: DC | PRN
  Administered 2016-05-30: 650 mg via ORAL
  Filled 2016-05-30 (×2): qty 2

## 2016-05-30 MED ORDER — LISINOPRIL 10 MG PO TABS: 10.0000 mg | ORAL_TABLET | Freq: Every day | ORAL | Status: DC

## 2016-05-30 MED ORDER — ESCITALOPRAM OXALATE 10 MG PO TABS
10.0000 mg | ORAL_TABLET | Freq: Every day | ORAL | Status: DC
Start: 2016-05-30 — End: 2016-06-05
  Administered 2016-05-30 – 2016-06-05 (×7): 10 mg via ORAL
  Filled 2016-05-30 (×7): qty 1

## 2016-05-30 MED ORDER — TRAZODONE HCL 50 MG PO TABS
25.0000 mg | ORAL_TABLET | Freq: Every evening | ORAL | Status: DC | PRN
  Administered 2016-05-30 – 2016-05-31 (×2): 25 mg via ORAL
  Filled 2016-05-30 (×2): qty 1

## 2016-05-30 MED ORDER — IPRATROPIUM-ALBUTEROL 0.5-2.5 (3) MG/3ML IN SOLN
3.0000 mL | RESPIRATORY_TRACT | Status: DC | PRN
Start: 2016-05-30 — End: 2016-05-31
  Administered 2016-05-30 – 2016-05-31 (×3): 3 mL via RESPIRATORY_TRACT
  Filled 2016-05-30 (×3): qty 3

## 2016-05-30 MED ORDER — TRAMADOL HCL 50 MG PO TABS
50.0000 mg | ORAL_TABLET | Freq: Four times a day (QID) | ORAL | Status: DC | PRN
  Administered 2016-06-01: 50 mg via ORAL
  Filled 2016-05-30: qty 1

## 2016-05-30 MED ORDER — BISACODYL 10 MG RE SUPP: 10.0000 mg | Freq: Every day | RECTAL | Status: DC | PRN

## 2016-05-30 MED ORDER — HYDROCODONE-ACETAMINOPHEN 5-325 MG PO TABS: 1.0000 | ORAL_TABLET | ORAL | Status: DC | PRN

## 2016-05-30 MED ORDER — HEPARIN SODIUM (PORCINE) 5000 UNIT/ML IJ SOLN
5000.0000 [IU] | Freq: Three times a day (TID) | INTRAMUSCULAR | Status: DC
  Administered 2016-05-30 – 2016-06-05 (×18): 5000 [IU] via SUBCUTANEOUS
  Filled 2016-05-30 (×19): qty 1

## 2016-05-30 MED ORDER — ONDANSETRON HCL 4 MG PO TABS
4.0000 mg | ORAL_TABLET | Freq: Four times a day (QID) | ORAL | Status: DC | PRN
Start: 2016-05-30 — End: 2016-05-31

## 2016-05-30 MED ORDER — MAGNESIUM CITRATE PO SOLN: 1.0000 | Freq: Once | ORAL | Status: DC | PRN

## 2016-05-30 MED ORDER — SENNOSIDES-DOCUSATE SODIUM 8.6-50 MG PO TABS
1.0000 | ORAL_TABLET | Freq: Every evening | ORAL | Status: DC | PRN
  Administered 2016-06-04: 1 via ORAL
  Filled 2016-05-30: qty 1

## 2016-05-30 MED ORDER — ONDANSETRON HCL 4 MG/2ML IJ SOLN: 4.0000 mg | Freq: Four times a day (QID) | INTRAMUSCULAR | Status: DC | PRN

## 2016-05-30 MED ORDER — LEVALBUTEROL HCL 1.25 MG/0.5ML IN NEBU
1.2500 mg | INHALATION_SOLUTION | Freq: Once | RESPIRATORY_TRACT | Status: DC
  Filled 2016-05-30: qty 0.5

## 2016-05-30 MED ORDER — LEVALBUTEROL HCL 0.63 MG/3ML IN NEBU
0.6300 mg | INHALATION_SOLUTION | RESPIRATORY_TRACT | Status: DC | PRN
Start: 2016-05-30 — End: 2016-05-31
  Administered 2016-05-30 – 2016-05-31 (×3): 0.63 mg via RESPIRATORY_TRACT
  Filled 2016-05-30 (×3): qty 3

## 2016-05-30 MED ORDER — LISINOPRIL 10 MG PO TABS
10.0000 mg | ORAL_TABLET | Freq: Every day | ORAL | Status: DC
  Administered 2016-05-30 – 2016-06-02 (×4): 10 mg via ORAL
  Filled 2016-05-30 (×4): qty 1

## 2016-05-30 MED ORDER — SODIUM CHLORIDE 0.9 % IV SOLN
INTRAVENOUS | Status: DC
  Administered 2016-05-30 (×2): via INTRAVENOUS

## 2016-05-30 NOTE — H&P (Signed)
History and Physical    [place] GUY:[id number] DOB: 24-Oct-1950 DOA: 05/29/2016   PCP: Nicoletta Dress, MD   Patient coming from:  Home   Chief Complaint: Shortness of breath   HPI: Cheryl Lane is a 66 y.o. female with medical history significant for hypertension, history of radiculopathy, transferred from [place] at [facility] after presenting with increasing shortness of breath. She reported chest heaviness on admission, with persistent cough and fatigue. She had been seen by her PCP one week prior, diagnosed with flu. At the time she was prescribed out commending, Tamiflu, along with the pack, and albuterol inhaler which she use 4 to 5 times a day, without significant relief. The patient denies any prior history of bronchitis or COPD.  Once transferred, she received continuous nebulizer treatment, along with Solu-Medrol and magnesium, with improvement of symptoms. Currently, she is on RA  without complaints. No recent long distance trips. She does not smoke. Denies fevers, chills, night sweats, vision changes, or mucositis  Denies any chest pain or palpitations. Denies lower extremity swelling. Denies nausea, heartburn or change in bowel habits.   Denies abdominal pain. Appetite is normal. Denies any dysuria. Denies abnormal skin rashes, or neuropathy. Denies any bleeding issues such as epistaxis, hematemesis, hematuria or hematochezia.    ED Course:  BP 137/73 (BP Location: Left Arm)   Pulse 98   Temp 97.8 F (36.6 C) (Oral)   Resp 18   Ht 5' 7"  (1.702 m)   Wt 117 kg (258 lb)   SpO2 97%   BMI 40.41 kg/m    sodium 138 potassium 3.4 creatinine 1.22 at baseline EGF R 52 - 44 troponin less than 0.03 white count 3.9 hemoglobin 13.4 platelets 251 glucose 113 bicarb 26  CXR consistent with acute bronchitis.  Received nebs, Solumedrol, Magnesium with improvement of symptoms.   Currently on O2 at 2L   Admit to [place] of Systems: As per HPI otherwise 10 point review  of systems negative.   Past Medical History:  Diagnosis Date  . Cervical radiculopathy   . Chronic pain in left shoulder   . Hypertension     Past Surgical History:  Procedure Laterality Date  . APPENDECTOMY      Social History Social History   Social History  . Marital status: Widowed    Spouse name: N/A  . Number of children: N/A  . Years of education: N/A   Occupational History  . Not on file.   Social History Main Topics  . Smoking status: Never Smoker  . Smokeless tobacco: Never Used  . Alcohol use No  . Drug use: No  . Sexual activity: Not on file   Other Topics Concern  . Not on file   Social History Narrative  . No narrative on file     No Known Allergies  Family History  Problem Relation Age of Onset  . Hypertension Other       Prior to Admission medications   Medication Sig Start Date End Date Taking? Authorizing Provider  albuterol (PROVENTIL HFA;VENTOLIN HFA) 108 (90 Base) MCG/ACT inhaler Inhale into the lungs every 6 (six) hours as needed for wheezing or shortness of breath.   Yes Historical Provider, MD  azithromycin (ZITHROMAX) 500 MG tablet Take by mouth daily.   Yes Historical Provider, MD  oseltamivir (TAMIFLU) 75 MG capsule Take 75 mg by mouth.   Yes Historical Provider, MD  cetirizine (ZYRTEC) 10 MG tablet Take 10 mg by mouth daily.  Historical Provider, MD  escitalopram (LEXAPRO) 10 MG tablet Take 10 mg by mouth daily.    Historical Provider, MD  lisinopril (PRINIVIL,ZESTRIL) 10 MG tablet Take 10 mg by mouth daily.    Historical Provider, MD  traMADol (ULTRAM) 50 MG tablet Take 50 mg by mouth every 6 (six) hours as needed for moderate pain.    Historical Provider, MD    Physical Exam:  Vitals:   05/30/16 0152 05/30/16 0404 05/30/16 0432 05/30/16 0850  BP: 121/74 (!) 122/58  137/73  Pulse: (!) 109 (!) 105  98  Resp: 18 18    Temp: 98.1 F (36.7 C) 97.8 F (36.6 C)  97.8 F (36.6 C)  TempSrc: Oral Oral  Oral  SpO2: 95% 97%  99% 97%  Weight:      Height:       Constitutional: NAD, calm, ill appearing Eyes: PERRL, lids and conjunctivae normal ENMT: Mucous membranes are moist, without exudate or lesions  Neck: normal, supple, no masses, no thyromegaly Respiratory:  Bilateral crackles without wheezing. Normal respiratory effort  Cardiovascular: Regular rate and rhythm, no murmurs / rubs / gallops. No extremity edema. 2+ pedal pulses. No carotid bruits.  Abdomen:  Obese, Soft, non tender, No hepatosplenomegaly. Bowel sounds positive.  Musculoskeletal: no clubbing / cyanosis. Moves all extremities Skin: no jaundice, No lesions.  Neurologic: Sensation intact  Strength equal in all extremities Psychiatric:   Alert and oriented x 3. Normal mood.     Labs on Admission: I have personally reviewed following labs and imaging studies  CBC:  Recent Labs Lab 05/29/16 1934  WBC 3.9*  NEUTROABS 1.6*  HGB 13.4  HCT 41.3  MCV 87.5  PLT 779    Basic Metabolic Panel:  Recent Labs Lab 05/29/16 1934  NA 138  K 3.4*  CL 102  CO2 26  GLUCOSE 113*  BUN 15  CREATININE 1.22*  CALCIUM 8.8*    GFR: Estimated Creatinine Clearance: 58.4 mL/min (by C-G formula based on SCr of 1.22 mg/dL (H)).  Liver Function Tests:  Recent Labs Lab 05/29/16 1934  AST 22  ALT 10*  ALKPHOS 65  BILITOT 0.8  PROT 7.7  ALBUMIN 3.8   No results for input(s): LIPASE, AMYLASE in the last 168 hours. No results for input(s): AMMONIA in the last 168 hours.  Coagulation Profile: No results for input(s): INR, PROTIME in the last 168 hours.  Cardiac Enzymes:  Recent Labs Lab 05/29/16 1934  TROPONINI <0.03    BNP (last 3 results) No results for input(s): PROBNP in the last 8760 hours.  HbA1C: No results for input(s): HGBA1C in the last 72 hours.  CBG: No results for input(s): GLUCAP in the last 168 hours.  Lipid Profile: No results for input(s): CHOL, HDL, LDLCALC, TRIG, CHOLHDL, LDLDIRECT in the last 72  hours.  Thyroid Function Tests: No results for input(s): TSH, T4TOTAL, FREET4, T3FREE, THYROIDAB in the last 72 hours.  Anemia Panel: No results for input(s): VITAMINB12, FOLATE, FERRITIN, TIBC, IRON, RETICCTPCT in the last 72 hours.  Urine analysis: No results found for: COLORURINE, APPEARANCEUR, LABSPEC, PHURINE, GLUCOSEU, HGBUR, BILIRUBINUR, KETONESUR, PROTEINUR, UROBILINOGEN, NITRITE, LEUKOCYTESUR  Sepsis Labs: @LABRCNTIP (procalcitonin:4,lacticidven:4) )No results found for this or any previous visit (from the past 240 hour(s)).   Radiological Exams on Admission: Dg Chest 2 View  Result Date: 05/29/2016 CLINICAL DATA:  66 year old who states she was diagnosed with influenza 1 week ago, presenting now with worsening cough and shortness of breath associated with mid chest pain. EXAM: CHEST  2 VIEW COMPARISON:  12/29/2015, 12/21/2015 and earlier, including CT chest 12/22/2015. FINDINGS: Cardiac silhouette normal in size, unchanged. Thoracic aorta tortuous and atherosclerotic, unchanged. Moderate-sized hiatal hernia, unchanged. Hilar and mediastinal contours otherwise unremarkable. Mild to moderate central peribronchial thickening, more so than on the most recent prior examination. Lungs otherwise clear. No localized airspace consolidation. No pleural effusions. No pneumothorax. Normal pulmonary vascularity. Degenerative changes involving the thoracic spine. IMPRESSION: 1. Mild-to-moderate changes of acute bronchitis and/or asthma without focal airspace pneumonia. 2. Moderate-sized hiatal hernia, unchanged. Electronically Signed   By: Evangeline Dakin M.D.   On: 05/29/2016 20:04    EKG: Independently reviewed.  Assessment/Plan Active Problems:   Bronchitis   CAP (community acquired pneumonia)   Essential hypertension   Left shoulder pain   Depression   Chronic kidney disease (CKD), stage IV (severe) (HCC)    Acute hypoxic respiratory failure likely secondary to acute bronchitis in the  setting of recent flu. CXR consistent with acute bronchitis.  WBC 3.9  Tmax 99.1, now afebrile Bicarb 26 . Received nebs, Solumedrol, Magnesium with improvement of symptoms.   Currently on  sats in the 90s in RA   Admit to Medsurg Blood and sputum cultures Resp virus panel  Continue Xopenex q 4 prn and Duoneb q 2 prn   O2 prn CBC in am Incentive spirometry CXR in am    Hypertension BP 122/58   Pulse  105   Controlled Hold ACE I today to improve kidney function, may resume in am   Chronic left shoulder pain  Continue Utram   Depression Continue home Lexapro   Chronic kidney disease stage 2-3 per labs, dating back to 2015  baseline creatinine 1.1-1.2   EGFR in the 40s  Current Cr 1.22. Patient has been taking significant amount of Gatorrade over the last 3 days. Able to produce urine without difficulty. Lab Results  Component Value Date   CREATININE 1.22 (H) 05/29/2016   CREATININE 1.24 (H) 12/24/2015   CREATININE 1.19 (H) 12/23/2015  IVF Repeat CMET in am    DVT prophylaxis:  Heparin  Code Status:   Full     Family Communication:  Discussed with patient Disposition Plan: Expect patient to be discharged to home after condition improves Consults called:    None Admission status:   Obs  Medsurg     Shontell Prosser E, PA-C [facility]   05/30/2016, 11:03 AM

## 2016-05-30 NOTE — ED Notes (Signed)
[facility] here for transport at this time.  

## 2016-05-30 NOTE — Care Management Obs Status (Signed)
[place] NOTIFICATION   Patient Details  Name: Cheryl Lane MRN: [id number] Date of Birth: [birth year]   Medicare Observation Status Notification Given:  Yes    Erenest Rasher, RN 05/30/2016, 6:54 PM

## 2016-05-30 NOTE — Care Management Note (Signed)
Case Management Note  Patient Details  Name: Cheryl Lane MRN: [id number] Date of Birth: 01/23/1951  Subjective/Objective:  Bronchitis, CAP                   Action/Plan: Discharge Planning: NCM spoke to pt and dtr, Lovie POA. Lives in home with dtr, Bowman. Pt currently on oxygen in room. Will check oxygen sat prior to dc to make sure home oxygen is not needed. Pt may also need neb machine for home. Pt had [facility] in the past. Waiting PT recommendations for home.   PCP Nicoletta Dress MD  Expected Discharge Date:                 Expected Discharge Plan:  Home/Self Care  In-House Referral:  NA  Discharge planning Services  CM Consult  Post Acute Care Choice:  NA Choice offered to:  NA  DME Arranged:    DME Agency:     HH Arranged:    HH Agency:     Status of Service:  In process, will continue to follow  If discussed at Long Length of Stay Meetings, dates discussed:    Additional Comments:  Erenest Rasher, RN 05/30/2016, 6:55 PM

## 2016-05-30 NOTE — Progress Notes (Signed)
Received pt on floor. Pt VS are stable. Pt has a pain level of 7 out of 10 in chest when coughing. Paged [facility] and informed them of pt's arrival and pain. Pt is in room lights dim, bed low and call bell in reach.

## 2016-05-30 NOTE — Progress Notes (Signed)
Lab called RN to notify pt positive for RSV.  MD paged.

## 2016-05-31 ENCOUNTER — Encounter (HOSPITAL_COMMUNITY): Payer: Self-pay | Admitting: General Practice

## 2016-05-31 ENCOUNTER — Inpatient Hospital Stay (HOSPITAL_COMMUNITY): Payer: Medicare Other

## 2016-05-31 DIAGNOSIS — I10 Essential (primary) hypertension: Secondary | ICD-10-CM

## 2016-05-31 DIAGNOSIS — J205 Acute bronchitis due to respiratory syncytial virus: Principal | ICD-10-CM

## 2016-05-31 DIAGNOSIS — J40 Bronchitis, not specified as acute or chronic: Secondary | ICD-10-CM

## 2016-05-31 DIAGNOSIS — N182 Chronic kidney disease, stage 2 (mild): Secondary | ICD-10-CM

## 2016-05-31 LAB — COMPREHENSIVE METABOLIC PANEL
ALK PHOS: 60 U/L (ref 38–126)
ALT: 13 U/L — AB (ref 14–54)
AST: 30 U/L (ref 15–41)
Albumin: 3.2 g/dL — ABNORMAL LOW (ref 3.5–5.0)
Anion gap: 10 (ref 5–15)
BUN: 19 mg/dL (ref 6–20)
CALCIUM: 8.4 mg/dL — AB (ref 8.9–10.3)
CO2: 27 mmol/L (ref 22–32)
CREATININE: 1.12 mg/dL — AB (ref 0.44–1.00)
Chloride: 102 mmol/L (ref 101–111)
GFR, EST AFRICAN AMERICAN: 57 mL/min — AB (ref >60)
GFR, EST NON AFRICAN AMERICAN: 49 mL/min — AB (ref >60)
Glucose, Bld: 115 mg/dL — ABNORMAL HIGH (ref 65–99)
Potassium: 3.8 mmol/L (ref 3.5–5.1)
Sodium: 139 mmol/L (ref 135–145)
Total Bilirubin: 0.5 mg/dL (ref 0.3–1.2)
Total Protein: 6.5 g/dL (ref 6.5–8.1)

## 2016-05-31 LAB — CBC
HCT: 37.6 % (ref 36.0–46.0)
Hemoglobin: 11.7 g/dL — ABNORMAL LOW (ref 12.0–15.0)
MCH: 27.7 pg (ref 26.0–34.0)
MCHC: 31.1 g/dL (ref 30.0–36.0)
MCV: 89.1 fL (ref 78.0–100.0)
PLATELETS: 225 10*3/uL (ref 150–400)
RBC: 4.22 MIL/uL (ref 3.87–5.11)
RDW: 15.9 % — AB (ref 11.5–15.5)
WBC: 5.5 10*3/uL (ref 4.0–10.5)

## 2016-05-31 LAB — CBC AND DIFFERENTIAL: WBC: 5.5 10*3/mL

## 2016-05-31 LAB — HEPATIC FUNCTION PANEL: Bilirubin, Total: 0.5 mg/dL

## 2016-05-31 LAB — BASIC METABOLIC PANEL
BUN: 19 mg/dL (ref 4–21)
Creatinine: 1.1 mg/dL (ref 0.5–1.1)
GLUCOSE: 115 mg/dL
Sodium: 139 mmol/L (ref 137–147)

## 2016-05-31 MED ORDER — METHYLPREDNISOLONE SODIUM SUCC 125 MG IJ SOLR
60.0000 mg | Freq: Three times a day (TID) | INTRAMUSCULAR | Status: DC
  Administered 2016-05-31 – 2016-06-01 (×4): 60 mg via INTRAVENOUS
  Filled 2016-05-31 (×4): qty 2

## 2016-05-31 MED ORDER — LORATADINE 10 MG PO TABS
10.0000 mg | ORAL_TABLET | Freq: Every day | ORAL | Status: DC
  Administered 2016-05-31 – 2016-06-05 (×6): 10 mg via ORAL
  Filled 2016-05-31 (×6): qty 1

## 2016-05-31 MED ORDER — BUDESONIDE 0.25 MG/2ML IN SUSP
0.2500 mg | Freq: Two times a day (BID) | RESPIRATORY_TRACT | Status: DC
  Administered 2016-05-31 – 2016-06-05 (×11): 0.25 mg via RESPIRATORY_TRACT
  Filled 2016-05-31 (×11): qty 2

## 2016-05-31 MED ORDER — ALBUTEROL SULFATE (2.5 MG/3ML) 0.083% IN NEBU
2.5000 mg | INHALATION_SOLUTION | RESPIRATORY_TRACT | Status: DC | PRN
  Administered 2016-05-31 – 2016-06-01 (×3): 2.5 mg via RESPIRATORY_TRACT
  Filled 2016-05-31 (×3): qty 3

## 2016-05-31 MED ORDER — METOCLOPRAMIDE HCL 5 MG/ML IJ SOLN
10.0000 mg | Freq: Three times a day (TID) | INTRAMUSCULAR | Status: DC | PRN
Start: 2016-05-31 — End: 2016-06-05

## 2016-05-31 MED ORDER — OXYMETAZOLINE HCL 0.05 % NA SOLN
1.0000 | Freq: Two times a day (BID) | NASAL | Status: AC
  Administered 2016-05-31 – 2016-06-02 (×6): 1 via NASAL
  Filled 2016-05-31: qty 15

## 2016-05-31 MED ORDER — ALPRAZOLAM 0.25 MG PO TABS
0.2500 mg | ORAL_TABLET | Freq: Three times a day (TID) | ORAL | Status: DC | PRN
  Administered 2016-06-01 – 2016-06-04 (×6): 0.25 mg via ORAL
  Filled 2016-05-31 (×7): qty 1

## 2016-05-31 MED ORDER — GUAIFENESIN ER 600 MG PO TB12
600.0000 mg | ORAL_TABLET | Freq: Two times a day (BID) | ORAL | Status: DC
  Administered 2016-05-31 – 2016-06-05 (×11): 600 mg via ORAL
  Filled 2016-05-31 (×11): qty 1

## 2016-05-31 MED ORDER — FLUTICASONE PROPIONATE 50 MCG/ACT NA SUSP
2.0000 | Freq: Every day | NASAL | Status: DC
  Administered 2016-05-31 – 2016-06-05 (×6): 2 via NASAL
  Filled 2016-05-31: qty 16

## 2016-05-31 MED ORDER — BENZONATATE 100 MG PO CAPS
200.0000 mg | ORAL_CAPSULE | Freq: Three times a day (TID) | ORAL | Status: DC | PRN
Start: 2016-05-31 — End: 2016-06-05
  Administered 2016-05-31 – 2016-06-05 (×4): 200 mg via ORAL
  Filled 2016-05-31 (×4): qty 2

## 2016-05-31 MED ORDER — LEVALBUTEROL HCL 0.63 MG/3ML IN NEBU
0.6300 mg | INHALATION_SOLUTION | Freq: Four times a day (QID) | RESPIRATORY_TRACT | Status: DC
  Administered 2016-05-31 – 2016-06-05 (×20): 0.63 mg via RESPIRATORY_TRACT
  Filled 2016-05-31 (×21): qty 3

## 2016-05-31 NOTE — Progress Notes (Addendum)
PROGRESS NOTE        PATIENT DETAILS Name: Cheryl Lane Age: 66 y.o. Sex: female Date of Birth: Dec 14, 1950 Admit Date: 05/29/2016 Admitting Physician Waldemar Dickens, MD GXQ:JJHERDE,YCXKGYJ E, MD  Brief Narrative: Patient is a 66 y.o. female with PMH significant of HTN, asthma, CKD 2-3, and radiculopathy. She was recently diagnosed with influenza. She was admitted on 2/5 with several days history of worsening cough, shortness of breath. She was thought to have acute bronchitis, and admitted for further evaluation and treatment. See below for further details.   Subjective: Although better-does not feel that she is back to her usual baseline.  Assessment/Plan: Acute hypoxic respiratory failure secondary to acute bronchitis: Secondary to RSV-slowly improving-continues to have rhonchi. Continue nebulized bronchodilators, IV Solu-Medrol. Also complains of some nasal congestion-hence will start afrin, Claritin and Flonase   Chronic kidney disease stage 2-3, per labs dating back to 2015: creatinine back to her baseline  Essential Hypertension: Controlled - continue lisinopril-follow  Chronic left shoulder pain: Continue Utram.   Depression: appears stable-Continue Lexapro.   DVT Prophylaxis: Prophylactic Heparin [place]  Code Status: Full code  Family Communication: None at bedside  Disposition Plan: Remain inpatient-home in the next 1-2 days  Antimicrobial agents: Anti-infectives    None     Procedures: None  CONSULTS:  None  Time spent: 25- minutes-Greater than 50% of this time was spent in counseling, explanation of diagnosis, planning of further management, and coordination of care.  MEDICATIONS: Scheduled Meds: . budesonide (PULMICORT) nebulizer solution  0.25 mg Nebulization BID  . escitalopram  10 mg Oral Daily  . fluticasone  2 spray Each Nare Daily  . guaiFENesin  600 mg Oral BID  . heparin  5,000 Units Subcutaneous Q8H  .  levalbuterol  0.63 mg Nebulization Q6H  . levalbuterol  1.25 mg Nebulization Once  . lisinopril  10 mg Oral Daily  . loratadine  10 mg Oral Daily  . methylPREDNISolone (SOLU-MEDROL) injection  60 mg Intravenous Q8H  . oxymetazoline  1 spray Each Nare BID   Continuous Infusions: PRN Meds:.acetaminophen **OR** acetaminophen, albuterol, benzonatate, bisacodyl, HYDROcodone-acetaminophen, magnesium citrate, metoCLOPramide (REGLAN) injection, senna-docusate, traMADol, traZODone   PHYSICAL EXAM: Vital signs: Vitals:   05/30/16 0850 05/30/16 1436 05/30/16 2041 05/31/16 0301  BP: 137/73 (!) 141/75 138/62   Pulse: 98 86 76   Resp:  18 18   Temp: 97.8 F (36.6 C) 98.3 F (36.8 C) 98.3 F (36.8 C)   TempSrc: Oral Oral Oral   SpO2: 97% 97% 98%   Weight:    117.3 kg (258 lb 9.6 oz)  Height:       Filed Weights   05/29/16 1915 05/31/16 0301  Weight: 117 kg (258 lb) 117.3 kg (258 lb 9.6 oz)   Body mass index is 40.5 kg/m.   General appearance :Awake, alert, not in any distress. Speech Clear. Not toxic Looking Eyes:, pupils equally reactive to light and accomodation,no scleral icterus.Pink conjunctiva HEENT: Atraumatic and Normocephalic Neck: supple, no JVD. No cervical lymphadenopathy. No thyromegaly Resp:Good air entry bilaterally, +scattered rhonchi all over CVS: S1 S2 regular, no murmurs.  GI: Bowel sounds present, Non tender and not distended with no gaurding, rigidity or rebound.No organomegaly Extremities: B/L Lower Ext shows no edema, both legs are warm to touch Neurology:  speech clear,Non focal, sensation is grossly intact. Psychiatric: Normal judgment and  insight. Alert and oriented x 3. Normal mood. Musculoskeletal:No digital cyanosis Skin:No Rash, warm and dry Wounds:N/A  I have personally reviewed following labs and imaging studies  LABORATORY DATA: CBC:  Recent Labs Lab 05/29/16 1934 05/31/16 0537  WBC 3.9* 5.5  NEUTROABS 1.6*  --   HGB 13.4 11.7*  HCT 41.3  37.6  MCV 87.5 89.1  PLT 251 528    Basic Metabolic Panel:  Recent Labs Lab 05/29/16 1934 05/31/16 0537  NA 138 139  K 3.4* 3.8  CL 102 102  CO2 26 27  GLUCOSE 113* 115*  BUN 15 19  CREATININE 1.22* 1.12*  CALCIUM 8.8* 8.4*    GFR: Estimated Creatinine Clearance: 63.7 mL/min (by C-G formula based on SCr of 1.12 mg/dL (H)).  Liver Function Tests:  Recent Labs Lab 05/29/16 1934 05/31/16 0537  AST 22 30  ALT 10* 13*  ALKPHOS 65 60  BILITOT 0.8 0.5  PROT 7.7 6.5  ALBUMIN 3.8 3.2*   No results for input(s): LIPASE, AMYLASE in the last 168 hours. No results for input(s): AMMONIA in the last 168 hours.  Coagulation Profile: No results for input(s): INR, PROTIME in the last 168 hours.  Cardiac Enzymes:  Recent Labs Lab 05/29/16 1934  TROPONINI <0.03    BNP (last 3 results) No results for input(s): PROBNP in the last 8760 hours.  HbA1C: No results for input(s): HGBA1C in the last 72 hours.  CBG: No results for input(s): GLUCAP in the last 168 hours.  Lipid Profile: No results for input(s): CHOL, HDL, LDLCALC, TRIG, CHOLHDL, LDLDIRECT in the last 72 hours.  Thyroid Function Tests: No results for input(s): TSH, T4TOTAL, FREET4, T3FREE, THYROIDAB in the last 72 hours.  Anemia Panel: No results for input(s): VITAMINB12, FOLATE, FERRITIN, TIBC, IRON, RETICCTPCT in the last 72 hours.  Urine analysis: No results found for: COLORURINE, APPEARANCEUR, LABSPEC, PHURINE, GLUCOSEU, HGBUR, BILIRUBINUR, KETONESUR, PROTEINUR, UROBILINOGEN, NITRITE, LEUKOCYTESUR  Sepsis Labs: Lactic Acid, Venous No results found for: LATICACIDVEN  MICROBIOLOGY: Recent Results (from the past 240 hour(s))  Respiratory Panel by PCR     Status: Abnormal   Collection Time: 05/30/16 12:52 PM  Result Value Ref Range Status   Adenovirus NOT DETECTED NOT DETECTED Final   Coronavirus 229E NOT DETECTED NOT DETECTED Final   Coronavirus HKU1 NOT DETECTED NOT DETECTED Final   Coronavirus  NL63 NOT DETECTED NOT DETECTED Final   Coronavirus OC43 NOT DETECTED NOT DETECTED Final   Metapneumovirus NOT DETECTED NOT DETECTED Final   Rhinovirus / Enterovirus NOT DETECTED NOT DETECTED Final   Influenza A NOT DETECTED NOT DETECTED Final   Influenza B NOT DETECTED NOT DETECTED Final   Parainfluenza Virus 1 NOT DETECTED NOT DETECTED Final   Parainfluenza Virus 2 NOT DETECTED NOT DETECTED Final   Parainfluenza Virus 3 NOT DETECTED NOT DETECTED Final   Parainfluenza Virus 4 NOT DETECTED NOT DETECTED Final   Respiratory Syncytial Virus DETECTED (A) NOT DETECTED Final    Comment: CRITICAL RESULT CALLED TO, READ BACK BY AND VERIFIED WITH: T. Fairing RN 15:50 05/30/16 ([email or web address])    Bordetella pertussis NOT DETECTED NOT DETECTED Final   Chlamydophila pneumoniae NOT DETECTED NOT DETECTED Final   Mycoplasma pneumoniae NOT DETECTED NOT DETECTED Final    RADIOLOGY STUDIES/RESULTS: X-ray Chest Pa And Lateral  Result Date: 05/31/2016 CLINICAL DATA:  Flu symptoms requiring admission 2 days ago. Persistent symptoms and weakness. History of asthma, chronic renal insufficiency EXAM: CHEST  2 VIEW COMPARISON:  Chest x-ray of May 29, 2016  FINDINGS: The lungs are adequately inflated. The interstitial markings are coarse though stable. There is no alveolar infiltrate. There is no pleural effusion. The heart and pulmonary vascularity are normal. The mediastinum is normal in width. The bony thorax exhibits no acute abnormality. 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 IMPRESSION: Chronic interstitial prominence, stable. No acute pneumonia nor CHF. Electronically Signed   By: David  Martinique M.D.   On: 05/31/2016 08:49   Dg Chest 2 View  Result Date: 05/29/2016 CLINICAL DATA:  66 year old who states she was diagnosed with influenza 1 week ago, presenting now with worsening cough and shortness of breath associated with mid chest pain. EXAM: CHEST  2 VIEW COMPARISON:  12/29/2015,  12/21/2015 and earlier, including CT chest 12/22/2015. FINDINGS: Cardiac silhouette normal in size, unchanged. Thoracic aorta tortuous and atherosclerotic, unchanged. Moderate-sized hiatal hernia, unchanged. Hilar and mediastinal contours otherwise unremarkable. Mild to moderate central peribronchial thickening, more so than on the most recent prior examination. Lungs otherwise clear. No localized airspace consolidation. No pleural effusions. No pneumothorax. Normal pulmonary vascularity. Degenerative changes involving the thoracic spine. IMPRESSION: 1. Mild-to-moderate changes of acute bronchitis and/or asthma without focal airspace pneumonia. 2. Moderate-sized hiatal hernia, unchanged. Electronically Signed   By: Evangeline Dakin M.D.   On: 05/29/2016 20:04     LOS: 1 day   Nena Alexander MD  If 7PM-7AM, please contact night-coverage [email or web address] Password [facility] 05/31/2016, 9:57 AM

## 2016-05-31 NOTE — Progress Notes (Signed)
SATURATION QUALIFICATIONS: (This note is used to comply with regulatory documentation for home oxygen)  Patient Saturations on Room Air at Rest = 92-94%  Patient Saturations on Room Air while Ambulating = 85-87%  Patient Saturations on 3 Liters of oxygen while Ambulating = 93-96% (Tried 2L but sats 88-90% on 2L)  Please briefly explain why patient needs home oxygen:May need home O2 as sats desat on RA while ambulating.  Thanks. [place] [phone] (pager)

## 2016-05-31 NOTE — Evaluation (Signed)
Physical Therapy Evaluation Patient Details Name: Cheryl Lane MRN: [id number] DOB: [birth year] Today's Date: 05/31/2016   History of Present Illness  Cheryl Lane is a 66 y.o. female  who presents with respiratory distress secondary to asthma exacerbation.  Positive for bronchitis and RSV.    Clinical Impression  Pt admitted with above diagnosis. Pt currently with functional limitations due to the deficits listed below (see PT Problem List). Pt was able to ambulate with RW in room but limited distance due to DOE 3/4 as well as O2 at 3L to keep sats >90%.  Would benefit from short term SNF if pt will agree to go as it seems she has struggled with [facility] therapy even at daughter's home as daughter and son in law work.  Will follow acutely.  Pt will benefit from skilled PT to increase their independence and safety with mobility to allow discharge to the venue listed below.      Follow Up Recommendations SNF;Supervision/Assistance - 24 hour (Pt would benefit but not sure if she wants to go)    Equipment Recommendations  3in1 (PT) (bariatric 3N1)    Recommendations for Other Services       Precautions / Restrictions Precautions Precautions: Fall;Other (comment) (DROPLET) Restrictions Weight Bearing Restrictions: No      Mobility  Bed Mobility               General bed mobility comments: Pt in chair on arrival.  Transfers Overall transfer level: Needs assistance Equipment used: Rolling walker (2 wheeled) Transfers: Sit to/from Stand Sit to Stand: Min assist         General transfer comment: cues for hand placement.  Pt used momentum to get up with PT assisting a little for power up from low recliner.   Ambulation/Gait Ambulation/Gait assistance: Min guard Ambulation Distance (Feet): 35 Feet Assistive device: Rolling walker (2 wheeled) Gait Pattern/deviations: Step-through pattern;Decreased stride length;Antalgic;Wide base of support;Trunk flexed   Gait velocity  interpretation: Below normal speed for age/gender General Gait Details: Pt was able to ambulate with RW with good overall safety.  Cues to stand tall as she flexes as she fatigues.  Cues to stay close to RW thoughtout walk as well.   Stairs            Wheelchair Mobility    Modified Rankin (Stroke Patients Only)       Balance Overall balance assessment: Needs assistance;History of Falls         Standing balance support: Bilateral upper extremity supported;During functional activity Standing balance-Leahy Scale: Poor Standing balance comment: relies on UE support.                              Pertinent Vitals/Pain Pain Assessment: No/denies pain  SATURATION QUALIFICATIONS: (This note is used to comply with regulatory documentation for home oxygen)  Patient Saturations on Room Air at Rest = 92-94%  Patient Saturations on Room Air while Ambulating = 85-87%  Patient Saturations on 3 Liters of oxygen while Ambulating = 93-96% (Tried 2L but sats 88-90% on 2L)  Please briefly explain why patient needs home oxygen:May need home O2 as sats desat on RA while ambulating  Home Living Family/patient expects to be discharged to:: Private residence Living Arrangements: Children Available Help at Discharge: Family;Home health;Personal care attendant;Available 24 hours/day (daughter reports it is set up for pt to have help 24/7.  pt gets panicky if not) Type of Home: House Home Access:  Stairs to enter Entrance Stairs-Rails: Right Entrance Stairs-Number of Steps: 3 Home Layout: Two level;1/2 bath on main level (stays on bottom level of daughter's home) Home Equipment: Walker - 4 wheels;Cane - single point      Prior Function Level of Independence: Needs assistance   Gait / Transfers Assistance Needed: used cane indoors most of time and rollator on bad days.  Used rollator outdoors and community.  ADL's / Homemaking Assistance Needed: aide sponge bathed pt         Hand Dominance        Extremity/Trunk Assessment   Upper Extremity Assessment Upper Extremity Assessment: Defer to OT evaluation    Lower Extremity Assessment Lower Extremity Assessment: Generalized weakness    Cervical / Trunk Assessment Cervical / Trunk Assessment: Kyphotic  Communication   Communication: No difficulties  Cognition Arousal/Alertness: Awake/alert Behavior During Therapy: WFL for tasks assessed/performed Overall Cognitive Status: Within Functional Limits for tasks assessed                      General Comments      Exercises General Exercises - Lower Extremity Ankle Circles/Pumps: AROM;Both;10 reps;Supine   Assessment/Plan    PT Assessment Patient needs continued PT services  PT Problem List Decreased activity tolerance;Decreased balance;Decreased mobility;Decreased knowledge of use of DME;Decreased safety awareness;Decreased knowledge of precautions;Obesity          PT Treatment Interventions DME instruction;Gait training;Functional mobility training;Therapeutic activities;Therapeutic exercise;Stair training;Balance training;Patient/family education    PT Goals (Current goals can be found in the Care Plan section)  Acute Rehab PT Goals Patient Stated Goal: to get better PT Goal Formulation: With patient Time For Goal Achievement: 06/14/16 Potential to Achieve Goals: Good    Frequency Min 3X/week   Barriers to discharge        Co-evaluation               End of Session Equipment Utilized During Treatment: Gait belt;Oxygen Activity Tolerance: Patient limited by fatigue Patient left: in chair;with call bell/phone within reach Nurse Communication: Mobility status         Time: 1206-1227 PT Time Calculation (min) (ACUTE ONLY): 21 min   Charges:   PT Evaluation $PT Eval Moderate Complexity: 1 Procedure     PT G Codes:        Cheryl Lane Cheryl Lane 66-Feb-2018, 12:50 PM  Cheryl Lane,PT Acute  Rehabilitation [phone] (pager)

## 2016-06-01 DIAGNOSIS — F411 Generalized anxiety disorder: Secondary | ICD-10-CM

## 2016-06-01 MED ORDER — METHYLPREDNISOLONE SODIUM SUCC 125 MG IJ SOLR
40.0000 mg | Freq: Three times a day (TID) | INTRAMUSCULAR | Status: DC
  Administered 2016-06-01 – 2016-06-03 (×6): 40 mg via INTRAVENOUS
  Filled 2016-06-01 (×6): qty 2

## 2016-06-01 NOTE — NC FL2 (Signed)
[place] LEVEL OF CARE SCREENING TOOL     IDENTIFICATION  Patient Name: Cheryl Lane Birthdate: 17-Jul-1950 Sex: female Admission Date (Current Location): 05/29/2016  [facility] and [place] Number:  Herbalist and Address:  [facility], [place] [address] [place],  [address]      Provider Number: [id number]  Attending Physician Name and Address:  Jonetta Osgood, MD  Relative Name and Phone Number:       Current Level of Care: Hospital Recommended Level of Care: [place] Prior Approval Number:    Date Approved/Denied:   PASRR Number: [id number]  Discharge Plan: SNF    Current Diagnoses: Patient Active Problem List   Diagnosis Date Noted  . Depression 05/30/2016  . Chronic kidney disease (CKD), stage IV (severe) ([place]) 05/30/2016  . Mild intermittent asthma with acute exacerbation   . Respiratory distress   . CKD (chronic kidney disease) stage 2, GFR 60-89 ml/min   . Chronic pain syndrome   . Bronchitis 05/29/2016  . Left shoulder pain   . Opacity of lung on imaging study   . Shoulder pain, acute   . Weakness generalized 12/22/2015  . Essential hypertension 12/22/2015  . Pneumonia 12/22/2015  . PVC's (premature ventricular contractions) 12/22/2015  . CAP (community acquired pneumonia) 12/21/2015    Orientation RESPIRATION BLADDER Height & Weight     Time, Situation, Place, Self  O2 (Nasal Cannula; 2L) Incontinent (When pt laughs) Weight: 259 lb 4.2 oz (117.6 kg) Height:  5\' 7"  (170.2 cm)  BEHAVIORAL SYMPTOMS/MOOD NEUROLOGICAL BOWEL NUTRITION STATUS      Continent  (Please see d/c summary)  AMBULATORY STATUS COMMUNICATION OF NEEDS Skin   Limited Assist Verbally Normal                       Personal Care Assistance Level of Assistance  Bathing, Feeding, Dressing Bathing Assistance: Limited assistance Feeding assistance: Independent Dressing Assistance: Limited assistance      Functional Limitations Info  Hearing, Speech, Sight Sight Info: Adequate Hearing Info: Adequate Speech Info: Adequate    SPECIAL CARE FACTORS FREQUENCY  PT (By licensed PT), OT (By licensed OT)     PT Frequency: min 3x week OT Frequency: min 3x week            Contractures Contractures Info: Not present    Additional Factors Info  Code Status, Allergies Code Status Info: Full Code Allergies Info: No known allergies           Current Medications (06/01/2016):  This is the current hospital active medication list Current Facility-Administered Medications  Medication Dose Route Frequency Provider Last Rate Last Dose  . acetaminophen (TYLENOL) tablet 650 mg  650 mg Oral Q6H PRN Rondel Jumbo, PA-C   650 mg at 05/30/16 1144   Or  . acetaminophen (TYLENOL) suppository 650 mg  650 mg Rectal Q6H PRN Rondel Jumbo, PA-C      . albuterol (PROVENTIL) (2.5 MG/3ML) 0.083% nebulizer solution 2.5 mg  2.5 mg Nebulization Q2H PRN Jonetta Osgood, MD   2.5 mg at 06/01/16 0456  . ALPRAZolam Duanne Moron) tablet 0.25 mg  0.25 mg Oral TID PRN Jonetta Osgood, MD   0.25 mg at 06/01/16 0937  . benzonatate (TESSALON) capsule 200 mg  200 mg Oral TID PRN Jonetta Osgood, MD   200 mg at 06/01/16 6759  . bisacodyl (DULCOLAX) suppository 10 mg  10 mg Rectal Daily PRN  Rondel Jumbo, PA-C      . budesonide (PULMICORT) nebulizer solution 0.25 mg  0.25 mg Nebulization BID Jonetta Osgood, MD   0.25 mg at 06/01/16 0724  . escitalopram (LEXAPRO) tablet 10 mg  10 mg Oral Daily Rondel Jumbo, PA-C   10 mg at 06/01/16 2094  . fluticasone (FLONASE) 50 MCG/ACT nasal spray 2 spray  2 spray Each Nare Daily Jonetta Osgood, MD   2 spray at 06/01/16 [phone]  . guaiFENesin (MUCINEX) 12 hr tablet 600 mg  600 mg Oral BID Jonetta Osgood, MD   600 mg at 06/01/16 0937  . heparin injection 5,000 Units  5,000 Units Subcutaneous Q8H Rondel Jumbo, PA-C   5,000 Units at 06/01/16 0500  . HYDROcodone-acetaminophen  (NORCO/VICODIN) 5-325 MG per tablet 1-2 tablet  1-2 tablet Oral Q4H PRN Rhetta Mura Schorr, NP      . levalbuterol Penne Lash) nebulizer solution 0.63 mg  0.63 mg Nebulization Q6H Jonetta Osgood, MD   0.63 mg at 06/01/16 0724  . levalbuterol (XOPENEX) nebulizer solution 1.25 mg  1.25 mg Nebulization Once Jeryl Columbia, NP      . lisinopril (PRINIVIL,ZESTRIL) tablet 10 mg  10 mg Oral Daily Rondel Jumbo, PA-C   10 mg at 06/01/16 2836  . loratadine (CLARITIN) tablet 10 mg  10 mg Oral Daily Jonetta Osgood, MD   10 mg at 06/01/16 6294  . magnesium citrate solution 1 Bottle  1 Bottle Oral Once PRN Rondel Jumbo, PA-C      . methylPREDNISolone sodium succinate (SOLU-MEDROL) 125 mg/2 mL injection 60 mg  60 mg Intravenous Q8H Jonetta Osgood, MD   60 mg at 06/01/16 0500  . metoCLOPramide (REGLAN) injection 10 mg  10 mg Intravenous Q8H PRN Jonetta Osgood, MD      . oxymetazoline (AFRIN) 0.05 % nasal spray 1 spray  1 spray Each Nare BID Jonetta Osgood, MD   1 spray at 06/01/16 [phone]  . senna-docusate (Senokot-S) tablet 1 tablet  1 tablet Oral QHS PRN Rondel Jumbo, PA-C      . traMADol Veatrice Bourbon) tablet 50 mg  50 mg Oral Q6H PRN Rondel Jumbo, PA-C   50 mg at 06/01/16 6503  . traZODone (DESYREL) tablet 25 mg  25 mg Oral QHS PRN Rondel Jumbo, PA-C   25 mg at 05/31/16 2109     Discharge Medications: Please see discharge summary for a list of discharge medications.  Relevant Imaging Results:  Relevant Lab Results:   Additional Information SSN: [id number]  Alla German, LCSW

## 2016-06-01 NOTE — Clinical Social Work Note (Signed)
Clinical Social Work Assessment  Patient Details  Name: Cheryl Lane MRN: [id number] Date of Birth: January 17, 1951  Date of referral:  06/01/16               Reason for consult:  Facility Placement                Permission sought to share information with:  Family Supports Permission granted to share information::     Name::     [name]  Agency::     Relationship::  Daughter  Contact Information:  [phone]  Housing/Transportation Living arrangements for the past 2 months:  [place] of Information:  Adult Children Patient Interpreter Needed:  None Criminal Activity/Legal Involvement Pertinent to Current Situation/Hospitalization:  No - Comment as needed Significant Relationships:  Adult Children Lives with:  Self Do you feel safe going back to the place where you live?    Need for family participation in patient care:     Care giving concerns:  Pt's daughter called RN and ask that staff contact daughter regarding her moms disposition plan because her "mom got really upset last night." Per pt's daughter she has better luck speaking with her mom after speaking to the staff. Pt's daughter reports it makes her mom really anxious when staff speaks directly with her.    Social Worker assessment / plan:  CSW spoke with pt's daughter via telephone. Pt is alert and oriented however becomes very anxious and upset when speaking with staff. Pt's daughter explain she will take the information and explain it to her mom in a way that will keep her more calm. Pt's daughter reports her mom has been very depressed every since her dad died. Pt's spouse did everything for pt. Pt's daughter reports she noticed how weak her mom was yesterday and realized her mom needs 24/h care before returning home. Per pt's daughter her mom stated last night, "I know I will need help." Pt's daughter knows the HR director at [facility] and really prefer her mom go there. Pt's daughter reports she  believes her mom will be more comfortable there knowing her daughter knows someone. However, if unable to go to [place] pt's daughter said she works really close by [employer] and [employer]. CSW will send referral and follow up with pt's daughter.   Employment status:  Retired Forensic scientist:  Medicare PT Recommendations:  [place] / Referral to community resources:  [place]  Patient/Family's Response to care:  Pt's daughter verbalized understanding of CSW role and expressed appreciation for support. Pt's daughter denies any concern regarding pt care at this time.   Patient/Family's Understanding of and Emotional Response to Diagnosis, Current Treatment, and Prognosis:  Pt's daughter understanding and realistic regarding physical limitations. Pt's daughter understands the need for SNF placement at d/c. Pt's daughter agreeable to SNF placement at d/c, at this time. Pt's daughter responses emotionally appropriate during conversation with CSW. Pt's daughter denies any concern regarding treatment plan at this time. CSW will continue to provide support and facilitate d/c needs.   Emotional Assessment Appearance:  Appears stated age Attitude/Demeanor/Rapport:  Unable to Assess Affect (typically observed):  Unable to Assess Orientation:  Oriented to Situation, Oriented to  Time, Oriented to Place, Oriented to Self Alcohol / Substance use:  Not Applicable Psych involvement (Current and /or in the community):  No (Comment)  Discharge Needs  Concerns to be addressed:  No discharge needs identified Readmission within the last 30 days:  No Current discharge risk:  Dependent with Mobility Barriers to Discharge:  Continued Medical Work up   [employer], LCSW 06/01/2016, 11:17 AM

## 2016-06-01 NOTE — Progress Notes (Signed)
PROGRESS NOTE        PATIENT DETAILS Name: Cheryl Lane Age: 66 y.o. Sex: female Date of Birth: 22-Sep-1950 Admit Date: 05/29/2016 Admitting Physician Waldemar Dickens, MD VOH:[id number],GYIRSWN E, MD  Brief Narrative: Patient is a 66 y.o. female with PMH significant of HTN, asthma, CKD 2-3, and radiculopathy. She was recently diagnosed with influenza. She was admitted on 2/5 with several days history of worsening cough, shortness of breath. She was thought to have acute bronchitis, and admitted for further evaluation and treatment. See below for further details.   Subjective:  Slowly improving-very weak and deconditioned-ambulating in the room this morning with the help of a walker and nursing staff. Although better she is not back to her usual baseline.  Assessment/Plan: Acute hypoxic respiratory failure secondary to acute bronchitis: Secondary to RSV-slowly improving-continues to have rhonchi. Continue nebulized bronchodilators, will start tapering Solu-Medrol.Continue anti-sinus therapy with afrin, Claritin and Flonase   Chronic kidney disease stage 2-3, per labs dating back to 2015: creatinine back to her baseline  Essential Hypertension: Controlled - continue lisinopril-follow  Chronic left shoulder pain: Continue Utram.   Depression: appears stable-Continue Lexapro.   Anxiety: Appears stable, continue Xanax  DVT Prophylaxis: Prophylactic Heparin Mar[place]c  Code Status: Full code  Family Communication: None at bedside-spoke at length with patient's daughter over the phone yesterday evening  Disposition Plan: Remain inpatient-SNF likely on 2/9  Antimicrobial agents: Anti-infectives    None     Procedures: None  CONSULTS:  None  Time spent: 25- minutes-Greater than 50% of this time was spent in counseling, explanation of diagnosis, planning of further management, and coordination of care.  MEDICATIONS: Scheduled Meds: . budesonide  (PULMICORT) nebulizer solution  0.25 mg Nebulization BID  . escitalopram  10 mg Oral Daily  . fluticasone  2 spray Each Nare Daily  . guaiFENesin  600 mg Oral BID  . heparin  5,000 Units Subcutaneous Q8H  . levalbuterol  0.63 mg Nebulization Q6H  . levalbuterol  1.25 mg Nebulization Once  . lisinopril  10 mg Oral Daily  . loratadine  10 mg Oral Daily  . methylPREDNISolone (SOLU-MEDROL) injection  60 mg Intravenous Q8H  . oxymetazoline  1 spray Each Nare BID   Continuous Infusions: PRN Meds:.acetaminophen **OR** acetaminophen, albuterol, ALPRAZolam, benzonatate, bisacodyl, HYDROcodone-acetaminophen, magnesium citrate, metoCLOPramide (REGLAN) injection, senna-docusate, traMADol, traZODone   PHYSICAL EXAM: Vital signs: Vitals:   05/31/16 2016 06/01/16 0500 06/01/16 0724 06/01/16 0740  BP:  108/81  (!) 154/83  Pulse:  63  81  Resp:  17  (!) 24  Temp:  97.7 F (36.5 C)  98.1 F (36.7 C)  TempSrc:  Oral  Oral  SpO2: 97% 94% 97% 97%  Weight:  117.6 kg (259 lb 4.2 oz)    Height:       Filed Weights   05/29/16 1915 05/31/16 0301 06/01/16 0500  Weight: 117 kg (258 lb) 117.3 kg (258 lb 9.6 oz) 117.6 kg (259 lb 4.2 oz)   Body mass index is 40.61 kg/m.   General appearance :Awake, alert, not in any distress. Speech Clear. Not toxic Looking Eyes:, pupils equally reactive to light and accomodation,no scleral icterus.Pink conjunctiva HEENT: Atraumatic and Normocephalic Neck: supple, no JVD. No cervical lymphadenopathy. No thyromegaly Resp:Good air entry bilaterally, +scattered rhonchi all over CVS: S1 S2 regular, no murmurs.  GI: Bowel sounds present, Non tender  and not distended with no gaurding, rigidity or rebound.No organomegaly Extremities: B/L Lower Ext shows no edema, both legs are warm to touch Neurology:  speech clear,Non focal, sensation is grossly intact. Psychiatric: Normal judgment and insight. Alert and oriented x 3. Normal mood. Musculoskeletal:No digital  cyanosis Skin:No Rash, warm and dry Wounds:N/A  I have personally reviewed following labs and imaging studies  LABORATORY DATA: CBC:  Recent Labs Lab 05/29/16 1934 05/31/16 0537  WBC 3.9* 5.5  NEUTROABS 1.6*  --   HGB 13.4 11.7*  HCT 41.3 37.6  MCV 87.5 89.1  PLT 251 867    Basic Metabolic Panel:  Recent Labs Lab 05/29/16 1934 05/31/16 0537  NA 138 139  K 3.4* 3.8  CL 102 102  CO2 26 27  GLUCOSE 113* 115*  BUN 15 19  CREATININE 1.22* 1.12*  CALCIUM 8.8* 8.4*    GFR: Estimated Creatinine Clearance: 63.8 mL/min (by C-G formula based on SCr of 1.12 mg/dL (H)).  Liver Function Tests:  Recent Labs Lab 05/29/16 1934 05/31/16 0537  AST 22 30  ALT 10* 13*  ALKPHOS 65 60  BILITOT 0.8 0.5  PROT 7.7 6.5  ALBUMIN 3.8 3.2*   No results for input(s): LIPASE, AMYLASE in the last 168 hours. No results for input(s): AMMONIA in the last 168 hours.  Coagulation Profile: No results for input(s): INR, PROTIME in the last 168 hours.  Cardiac Enzymes:  Recent Labs Lab 05/29/16 1934  TROPONINI <0.03    BNP (last 3 results) No results for input(s): PROBNP in the last 8760 hours.  HbA1C: No results for input(s): HGBA1C in the last 72 hours.  CBG: No results for input(s): GLUCAP in the last 168 hours.  Lipid Profile: No results for input(s): CHOL, HDL, LDLCALC, TRIG, CHOLHDL, LDLDIRECT in the last 72 hours.  Thyroid Function Tests: No results for input(s): TSH, T4TOTAL, FREET4, T3FREE, THYROIDAB in the last 72 hours.  Anemia Panel: No results for input(s): VITAMINB12, FOLATE, FERRITIN, TIBC, IRON, RETICCTPCT in the last 72 hours.  Urine analysis: No results found for: COLORURINE, APPEARANCEUR, LABSPEC, PHURINE, GLUCOSEU, HGBUR, BILIRUBINUR, KETONESUR, PROTEINUR, UROBILINOGEN, NITRITE, LEUKOCYTESUR  Sepsis Labs: Lactic Acid, Venous No results found for: LATICACIDVEN  MICROBIOLOGY: Recent Results (from the past 240 hour(s))  Respiratory Panel by PCR      Status: Abnormal   Collection Time: 05/30/16 12:52 PM  Result Value Ref Range Status   Adenovirus NOT DETECTED NOT DETECTED Final   Coronavirus 229E NOT DETECTED NOT DETECTED Final   Coronavirus HKU1 NOT DETECTED NOT DETECTED Final   Coronavirus NL63 NOT DETECTED NOT DETECTED Final   Coronavirus OC43 NOT DETECTED NOT DETECTED Final   Metapneumovirus NOT DETECTED NOT DETECTED Final   Rhinovirus / Enterovirus NOT DETECTED NOT DETECTED Final   Influenza A NOT DETECTED NOT DETECTED Final   Influenza B NOT DETECTED NOT DETECTED Final   Parainfluenza Virus 1 NOT DETECTED NOT DETECTED Final   Parainfluenza Virus 2 NOT DETECTED NOT DETECTED Final   Parainfluenza Virus 3 NOT DETECTED NOT DETECTED Final   Parainfluenza Virus 4 NOT DETECTED NOT DETECTED Final   Respiratory Syncytial Virus DETECTED (A) NOT DETECTED Final    Comment: CRITICAL RESULT CALLED TO, READ BACK BY AND VERIFIED WITH: T. Fairing RN 15:50 05/30/16 ([email or web address])    Bordetella pertussis NOT DETECTED NOT DETECTED Final   Chlamydophila pneumoniae NOT DETECTED NOT DETECTED Final   Mycoplasma pneumoniae NOT DETECTED NOT DETECTED Final    RADIOLOGY STUDIES/RESULTS: X-ray Chest Pa And Lateral  Result Date: 05/31/2016 CLINICAL DATA:  Flu symptoms requiring admission 2 days ago. Persistent symptoms and weakness. History of asthma, chronic renal insufficiency EXAM: CHEST  2 VIEW COMPARISON:  Chest x-ray of May 29, 2016 FINDINGS: The lungs are adequately inflated. The interstitial markings are coarse though stable. There is no alveolar infiltrate. There is no pleural effusion. The heart and pulmonary vascularity are normal. The mediastinum is normal in width. The bony thorax exhibits no acute abnormality. 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 IMPRESSION: Chronic interstitial prominence, stable. No acute pneumonia nor CHF. Electronically Signed   By: David  Martinique M.D.   On: 05/31/2016 08:49   Dg Chest 2  View  Result Date: 05/29/2016 CLINICAL DATA:  66 year old who states she was diagnosed with influenza 1 week ago, presenting now with worsening cough and shortness of breath associated with mid chest pain. EXAM: CHEST  2 VIEW COMPARISON:  12/29/2015, 12/21/2015 and earlier, including CT chest 12/22/2015. FINDINGS: Cardiac silhouette normal in size, unchanged. Thoracic aorta tortuous and atherosclerotic, unchanged. Moderate-sized hiatal hernia, unchanged. Hilar and mediastinal contours otherwise unremarkable. Mild to moderate central peribronchial thickening, more so than on the most recent prior examination. Lungs otherwise clear. No localized airspace consolidation. No pleural effusions. No pneumothorax. Normal pulmonary vascularity. Degenerative changes involving the thoracic spine. IMPRESSION: 1. Mild-to-moderate changes of acute bronchitis and/or asthma without focal airspace pneumonia. 2. Moderate-sized hiatal hernia, unchanged. Electronically Signed   By: Evangeline Dakin M.D.   On: 05/29/2016 20:04     LOS: 2 days   Nena Alexander MD  If 7PM-7AM, please contact night-coverage [email or web address] Password [facility] 06/01/2016, 11:43 AM

## 2016-06-02 DIAGNOSIS — N184 Chronic kidney disease, stage 4 (severe): Secondary | ICD-10-CM

## 2016-06-02 MED ORDER — TRAMADOL HCL 50 MG PO TABS: 50.0000 mg | ORAL_TABLET | Freq: Four times a day (QID) | ORAL | 0 refills | Status: DC | PRN

## 2016-06-02 MED ORDER — ALBUTEROL SULFATE (2.5 MG/3ML) 0.083% IN NEBU: 2.5000 mg | INHALATION_SOLUTION | RESPIRATORY_TRACT | 0 refills | Status: DC | PRN

## 2016-06-02 MED ORDER — ALBUTEROL SULFATE HFA 108 (90 BASE) MCG/ACT IN AERS: 2.0000 | INHALATION_SPRAY | RESPIRATORY_TRACT | 0 refills | Status: DC | PRN

## 2016-06-02 MED ORDER — ALPRAZOLAM 0.25 MG PO TABS: 0.2500 mg | ORAL_TABLET | Freq: Three times a day (TID) | ORAL | 0 refills | Status: DC | PRN

## 2016-06-02 MED ORDER — LORATADINE 10 MG PO TABS: 10.0000 mg | ORAL_TABLET | Freq: Every day | ORAL | 0 refills | Status: DC

## 2016-06-02 MED ORDER — FLUTICASONE PROPIONATE 50 MCG/ACT NA SUSP: 2.0000 | Freq: Every day | NASAL | 0 refills | Status: DC

## 2016-06-02 MED ORDER — IPRATROPIUM-ALBUTEROL 0.5-2.5 (3) MG/3ML IN SOLN: 3.0000 mL | Freq: Three times a day (TID) | RESPIRATORY_TRACT | 0 refills | Status: DC

## 2016-06-02 MED ORDER — PREDNISONE 10 MG PO TABS: ORAL_TABLET | ORAL | 0 refills | Status: DC

## 2016-06-02 MED ORDER — BENZONATATE 200 MG PO CAPS: 200.0000 mg | ORAL_CAPSULE | Freq: Three times a day (TID) | ORAL | 0 refills | Status: DC | PRN

## 2016-06-02 MED ORDER — GUAIFENESIN ER 600 MG PO TB12: 600.0000 mg | ORAL_TABLET | Freq: Two times a day (BID) | ORAL | 0 refills | Status: DC

## 2016-06-02 NOTE — Progress Notes (Signed)
Physical Therapy Treatment Patient Details Name: Cheryl Lane MRN: [id number] DOB: [birth year] Today's Date: 06/02/2016    History of Present Illness Cheryl Lane is a 66 y.o. female  who presents with respiratory distress secondary to asthma exacerbation.  Positive for bronchitis and RSV.      PT Comments    Pt admitted with above diagnosis. Pt currently with functional limitations due to balance and endurance deficits. Pt was able to ambulate with RW a longer distance on RA with sats 90% and greater.  Did replace O2 as pt was DOE 3/4 after ambulation and at present feels better on 2LO2.  95% on 2LO2. Pt will benefit from skilled PT to increase their independence and safety with mobility to allow discharge to the venue listed below.    Follow Up Recommendations  SNF;Supervision/Assistance - 24 hour     Equipment Recommendations  3in1 (PT) (bariatric 3N1)    Recommendations for Other Services       Precautions / Restrictions Precautions Precautions: Fall;Other (comment) (DROPLET) Restrictions Weight Bearing Restrictions: No    Mobility  Bed Mobility               General bed mobility comments: Pt in chair on arrival.  Transfers Overall transfer level: Needs assistance Equipment used: Rolling walker (2 wheeled) Transfers: Sit to/from Stand Sit to Stand: Min assist         General transfer comment: cues for hand placement.  Pt used momentum to get up with PT assisting a little for power up from low recliner.   Ambulation/Gait Ambulation/Gait assistance: Min guard Ambulation Distance (Feet): 70 Feet Assistive device: Rolling walker (2 wheeled) Gait Pattern/deviations: Step-through pattern;Decreased stride length;Antalgic;Wide base of support;Trunk flexed   Gait velocity interpretation: Below normal speed for age/gender General Gait Details: Pt was able to ambulate with RW with good overall safety.  Cues to stand tall as she flexes as she fatigues.  Cues to  stay close to RW thoughtout walk as well.    Stairs            Wheelchair Mobility    Modified Rankin (Stroke Patients Only)       Balance Overall balance assessment: Needs assistance;History of Falls         Standing balance support: Bilateral upper extremity supported;During functional activity Standing balance-Leahy Scale: Poor Standing balance comment: relies on UE support.                     Cognition Arousal/Alertness: Awake/alert Behavior During Therapy: WFL for tasks assessed/performed Overall Cognitive Status: Within Functional Limits for tasks assessed                      Exercises General Exercises - Lower Extremity Ankle Circles/Pumps: AROM;Both;10 reps;Supine Long Arc Quad: AROM;Both;10 reps;Seated    General Comments        Pertinent Vitals/Pain Pain Assessment: No/denies pain   RA with sats 90% and greater.  Did replace O2 as pt was DOE 3/4 after ambulation and at present feels better on 2LO2.  95% on 2LO2. Home Living                      Prior Function            PT Goals (current goals can now be found in the care plan section) Acute Rehab PT Goals Patient Stated Goal: to get better Progress towards PT goals: Progressing toward goals    Frequency  Min 3X/week      PT Plan Current plan remains appropriate    Co-evaluation             End of Session Equipment Utilized During Treatment: Gait belt;Oxygen Activity Tolerance: Patient limited by fatigue Patient left: in chair;with call bell/phone within reach     Time: 7494-4967 PT Time Calculation (min) (ACUTE ONLY): 21 min  Charges:  $Gait Training: 8-22 mins                    G Codes:      Denice Paradise Jun 27, 2016, 3:44 PM [employer] Acute Rehabilitation [phone] (pager)

## 2016-06-02 NOTE — Care Management Important Message (Signed)
Important Message  Patient Details  Name: Cheryl Lane MRN: [id number] Date of Birth: 1950/09/24   Medicare Important Message Given:  Yes    Acsa Estey [address]ine [address] 06/02/2016, 2:46 PM

## 2016-06-02 NOTE — Plan of Care (Signed)
Problem: Safety: Goal: Ability to remain free from injury will improve Outcome: Progressing Safety precautions and fall preventions maintained  Problem: Pain Managment: Goal: General experience of comfort will improve Outcome: Progressing Denies pain this shift  Problem: Tissue Perfusion: Goal: Risk factors for ineffective tissue perfusion will decrease Outcome: Progressing On Heparin SQ, no S/S of DVT noted  Problem: Activity: Goal: Risk for activity intolerance will decrease Outcome: Progressing OOB to BR with minimum assistance and tolerates well

## 2016-06-02 NOTE — Progress Notes (Signed)
Doing much better Thinks she is coughing up the "junk" finally Lungs-with a few scattered rhonchi-but much improved compared to the past few days Stable for discharge to SNF -see d/c summary for details

## 2016-06-02 NOTE — Discharge Summary (Addendum)
PATIENT DETAILS Name: Cheryl Lane Age: 66 y.o. Sex: female Date of Birth: 06-Nov-1950 MRN: [id number]. Admitting Physician: Waldemar Dickens, MD ERX:[id number] E, MD  Admit Date: 05/29/2016 Discharge date: 06/05/2016  Recommendations for Outpatient Follow-up:  1. Follow up with PCP in 1-2 weeks 2. Please obtain BMP/CBC in one week  Admitted From:  Home  Disposition: SNF   Home Health: No  Equipment/Devices: None  Discharge Condition: Stable  CODE STATUS: FULL CODE  Diet recommendation:  Heart Healthy  Brief Summary: See H&P, Labs, Consult and Test reports for all details in brief, Patient is a 66 y.o. female with PMH significant of HTN, asthma, CKD 2-3, and radiculopathy. She was recently diagnosed with influenza. She was admitted on 2/5 with several days history of worsening cough, shortness of breath. She was thought to have acute bronchitis, and admitted for further evaluation and treatment. See below for further details.   Brief Hospital Course: Acute hypoxic respiratory failure secondary to acute bronchitis: Secondary to RSV-improving, hardly any rhonchi heard today. Cough continues but much better than the past few days. Exam is also better compared to the past few days. Plans are to discharge to skilled nursing facility, continue tapering prednisone, and bronchodilators. She will need to follow up with her PCP upon discharge from SNF for further continued care.  Chronic kidney disease stage 2-3, per labs dating back to 2015: creatinine back to her baseline-follow periodically while at SNF  Essential Hypertension: Moderately Controlled -previously on lisinopril-but since continues to cough-has been switched to Amlodipine. Follow BP trend at SNF and adjust accordingly.  Chronic left shoulder pain: Continue Utram.   Depression: appears stable-Continue Lexapro.   Anxiety: Appears stable, continue Xanax  Generalized weakness with physical  deconditioning: Secondary to recent viral illnesses-she apparently had influenza a week or so prior to this hospitalization. Her medical issues seem to have stabilized-and her main issue is weakness and deconditioning. She is agreeable to be transferred to a skilled nursing facility for subacute rehabilitation.  Procedures/Studies: None  Discharge Diagnoses:  Active Problems:   CAP (community acquired pneumonia)   Essential hypertension   Left shoulder pain   Bronchitis   Depression   Chronic kidney disease (CKD), stage IV (severe) (HCC)   Mild intermittent asthma with acute exacerbation   Respiratory distress   CKD (chronic kidney disease) stage 2, GFR 60-89 ml/min   Chronic pain syndrome   Discharge Instructions:  Activity:  As tolerated with Full fall precautions use walker/cane & assistance as needed  Discharge Instructions    Diet - low sodium heart healthy    Complete by:  As directed    Discharge instructions    Complete by:  As directed    Follow with Primary MD  SCHULTZ,DOUGLAS E, MD  In 1-2 weeks  Please get a complete blood count and chemistry panel checked by your Primary MD at your next visit, and again as instructed by your Primary MD.  Get Medicines reviewed and adjusted: Please take all your medications with you for your next visit with your Primary MD  Laboratory/radiological data: Please request your Primary MD to go over all hospital tests and procedure/radiological results at the follow up, please ask your Primary MD to get all Hospital records sent to his/her office.  In some cases, they will be blood work, cultures and biopsy results pending at the time of your discharge. Please request that your primary care M.D. follows up on these results.  Also Note the following: If you  experience worsening of your admission symptoms, develop shortness of breath, life threatening emergency, suicidal or homicidal thoughts you must seek medical attention immediately  by calling 911 or calling your MD immediately  if symptoms less severe.  You must read complete instructions/literature along with all the possible adverse reactions/side effects for all the Medicines you take and that have been prescribed to you. Take any new Medicines after you have completely understood and accpet all the possible adverse reactions/side effects.   Do not drive when taking Pain medications or sleeping medications (Benzodaizepines)  Do not take more than prescribed Pain, Sleep and Anxiety Medications. It is not advisable to combine anxiety,sleep and pain medications without talking with your primary care practitioner  Special Instructions: If you have smoked or chewed Tobacco  in the last 2 yrs please stop smoking, stop any regular Alcohol  and or any Recreational drug use.  Wear Seat belts while driving.  Please note: You were cared for by a hospitalist during your hospital stay. Once you are discharged, your primary care physician will handle any further medical issues. Please note that NO REFILLS for any discharge medications will be authorized once you are discharged, as it is imperative that you return to your primary care physician (or establish a relationship with a primary care physician if you do not have one) for your post hospital discharge needs so that they can reassess your need for medications and monitor your lab values.   Increase activity slowly    Complete by:  As directed      Allergies as of 06/05/2016   No Known Allergies     Medication List    STOP taking these medications   azithromycin 500 MG tablet Commonly known as:  ZITHROMAX   cetirizine 10 MG tablet Commonly known as:  ZYRTEC   oseltamivir 75 MG capsule Commonly known as:  TAMIFLU     TAKE these medications   albuterol 108 (90 Base) MCG/ACT inhaler Commonly known as:  PROVENTIL HFA;VENTOLIN HFA Inhale 2 puffs into the lungs every 4 (four) hours as needed for wheezing or shortness of  breath. What changed:  how much to take  when to take this   albuterol (2.5 MG/3ML) 0.083% nebulizer solution Commonly known as:  PROVENTIL Take 3 mLs (2.5 mg total) by nebulization every 4 (four) hours as needed for wheezing or shortness of breath. What changed:  You were already taking a medication with the same name, and this prescription was added. Make sure you understand how and when to take each.   ALPRAZolam 0.25 MG tablet Commonly known as:  XANAX Take 1 tablet (0.25 mg total) by mouth 3 (three) times daily as needed for anxiety.   amLODipine 10 MG tablet Commonly known as:  NORVASC Take 1 tablet (10 mg total) by mouth daily.   benzonatate 200 MG capsule Commonly known as:  TESSALON Take 1 capsule (200 mg total) by mouth 3 (three) times daily as needed for cough.   escitalopram 10 MG tablet Commonly known as:  LEXAPRO Take 10 mg by mouth daily.   fluticasone 50 MCG/ACT nasal spray Commonly known as:  FLONASE Place 2 sprays into both nostrils daily.   guaiFENesin 600 MG 12 hr tablet Commonly known as:  MUCINEX Take 1 tablet (600 mg total) by mouth 2 (two) times daily.   ipratropium-albuterol 0.5-2.5 (3) MG/3ML Soln Commonly known as:  DUONEB Take 3 mLs by nebulization every 8 (eight) hours.   lisinopril 10 MG tablet Commonly  known as:  PRINIVIL,ZESTRIL Take 2 tablets (20 mg total) by mouth daily. What changed:  how much to take   loratadine 10 MG tablet Commonly known as:  CLARITIN Take 1 tablet (10 mg total) by mouth daily.   predniSONE 10 MG tablet Commonly known as:  DELTASONE Take 4 tablets (40 mg) daily for 2 days, then, Take 3 tablets (30 mg) daily for 2 days, then, Take 2 tablets (20 mg) daily for 2 days, then, Take 1 tablets (10 mg) daily for 1 days, then stop   traMADol 50 MG tablet Commonly known as:  ULTRAM Take 1 tablet (50 mg total) by mouth every 6 (six) hours as needed for moderate pain.      Follow-up Information    SCHULTZ,DOUGLAS  E, MD. Schedule an appointment as soon as possible for a visit in 1 week(s).   Specialty:  Internal Medicine Contact information: [place] [address] [phone]          No Known Allergies  Consultations:   None  Other Procedures/Studies: X-ray Chest Pa And Lateral  Result Date: 05/31/2016 CLINICAL DATA:  Flu symptoms requiring admission 2 days ago. Persistent symptoms and weakness. History of asthma, chronic renal insufficiency EXAM: CHEST  2 VIEW COMPARISON:  Chest x-ray of May 29, 2016 FINDINGS: The lungs are adequately inflated. The interstitial markings are coarse though stable. There is no alveolar infiltrate. There is no pleural effusion. The heart and pulmonary vascularity are normal. The mediastinum is normal in width. The bony thorax exhibits no acute abnormality. 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 IMPRESSION: Chronic interstitial prominence, stable. No acute pneumonia nor CHF. Electronically Signed   By: David  Martinique M.D.   On: 05/31/2016 08:49   Dg Chest 2 View  Result Date: 05/29/2016 CLINICAL DATA:  66 year old who states she was diagnosed with influenza 1 week ago, presenting now with worsening cough and shortness of breath associated with mid chest pain. EXAM: CHEST  2 VIEW COMPARISON:  12/29/2015, 12/21/2015 and earlier, including CT chest 12/22/2015. FINDINGS: Cardiac silhouette normal in size, unchanged. Thoracic aorta tortuous and atherosclerotic, unchanged. Moderate-sized hiatal hernia, unchanged. Hilar and mediastinal contours otherwise unremarkable. Mild to moderate central peribronchial thickening, more so than on the most recent prior examination. Lungs otherwise clear. No localized airspace consolidation. No pleural effusions. No pneumothorax. Normal pulmonary vascularity. Degenerative changes involving the thoracic spine. IMPRESSION: 1. Mild-to-moderate changes of acute bronchitis and/or asthma  without focal airspace pneumonia. 2. Moderate-sized hiatal hernia, unchanged. Electronically Signed   By: Evangeline Dakin M.D.   On: 05/29/2016 20:04     TODAY-DAY OF DISCHARGE:  Subjective:   Cheryl Lane today has no headache,no chest abdominal pain,no new weakness tingling or numbness, feels much better wants to go home today.  Objective:   Blood pressure 134/70, pulse 63, temperature 97.9 F (36.6 C), temperature source Oral, resp. rate 16, height 5\' 7"  (1.702 m), weight 117.5 kg (259 lb), SpO2 95 %.  Intake/Output Summary (Last 24 hours) at 06/05/16 1130 Last data filed at 06/05/16 0900  Gross per 24 hour  Intake              960 ml  Output                0 ml  Net              960 ml   Filed Weights   06/01/16 0500 06/03/16 0444 06/04/16 8592  Weight: 117.6 kg (259 lb 4.2 oz) 115 kg (253 lb 9.6 oz) 117.5 kg (259 lb)    Exam: Awake Alert, Oriented *3, No new F.N deficits, Normal affect [place].AT,PERRAL Supple Neck,No JVD, No cervical lymphadenopathy appriciated.  Symmetrical Chest wall movement, Good air movement bilaterally-few scattered rhonchi RRR,No Gallops,Rubs or new Murmurs, No Parasternal Heave +ve B.Sounds, Abd Soft, Non tender, No organomegaly appriciated, No rebound -guarding or rigidity. No Cyanosis, Clubbing or edema, No new Rash or bruise   PERTINENT RADIOLOGIC STUDIES: X-ray Chest Pa And Lateral  Result Date: 05/31/2016 CLINICAL DATA:  Flu symptoms requiring admission 2 days ago. Persistent symptoms and weakness. History of asthma, chronic renal insufficiency EXAM: CHEST  2 VIEW COMPARISON:  Chest x-ray of May 29, 2016 FINDINGS: The lungs are adequately inflated. The interstitial markings are coarse though stable. There is no alveolar infiltrate. There is no pleural effusion. The heart and pulmonary vascularity are normal. The mediastinum is normal in width. The bony thorax exhibits no acute abnormality. There is multilevel degenerative disc disease of  the thoracic spine. IMPRESSION: Chronic interstitial prominence, stable. No acute pneumonia nor CHF. Electronically Signed   By: David  Martinique M.D.   On: 05/31/2016 08:49   Dg Chest 2 View  Result Date: 05/29/2016 CLINICAL DATA:  66 year old who states she was diagnosed with influenza 1 week ago, presenting now with worsening cough and shortness of breath associated with mid chest pain. EXAM: CHEST  2 VIEW COMPARISON:  12/29/2015, 12/21/2015 and earlier, including CT chest 12/22/2015. FINDINGS: Cardiac silhouette normal in size, unchanged. Thoracic aorta tortuous and atherosclerotic, unchanged. Moderate-sized hiatal hernia, unchanged. Hilar and mediastinal contours otherwise unremarkable. Mild to moderate central peribronchial thickening, more so than on the most recent prior examination. Lungs otherwise clear. No localized airspace consolidation. No pleural effusions. No pneumothorax. Normal pulmonary vascularity. Degenerative changes involving the thoracic spine. IMPRESSION: 1. Mild-to-moderate changes of acute bronchitis and/or asthma without focal airspace pneumonia. 2. Moderate-sized hiatal hernia, unchanged. Electronically Signed   By: Evangeline Dakin M.D.   On: 05/29/2016 20:04     PERTINENT LAB RESULTS: CBC: No results for input(s): WBC, HGB, HCT, PLT in the last 72 hours. CMET CMP     Component Value Date/Time   NA 139 05/31/2016 0537   K 3.8 05/31/2016 0537   CL 102 05/31/2016 0537   CO2 27 05/31/2016 0537   GLUCOSE 115 (H) 05/31/2016 0537   BUN 19 05/31/2016 0537   CREATININE 1.12 (H) 05/31/2016 0537   CALCIUM 8.4 (L) 05/31/2016 0537   PROT 6.5 05/31/2016 0537   ALBUMIN 3.2 (L) 05/31/2016 0537   AST 30 05/31/2016 0537   ALT 13 (L) 05/31/2016 0537   ALKPHOS 60 05/31/2016 0537   BILITOT 0.5 05/31/2016 0537   GFRNONAA 49 (L) 05/31/2016 0537   GFRAA 57 (L) 05/31/2016 0537    GFR Estimated Creatinine Clearance: 63.8 mL/min (by C-G formula based on SCr of 1.12 mg/dL (H)). No  results for input(s): LIPASE, AMYLASE in the last 72 hours. No results for input(s): CKTOTAL, CKMB, CKMBINDEX, TROPONINI in the last 72 hours. Invalid input(s): POCBNP No results for input(s): DDIMER in the last 72 hours. No results for input(s): HGBA1C in the last 72 hours. No results for input(s): CHOL, HDL, LDLCALC, TRIG, CHOLHDL, LDLDIRECT in the last 72 hours. No results for input(s): TSH, T4TOTAL, T3FREE, THYROIDAB in the last 72 hours.  Invalid input(s): FREET3 No results for input(s): VITAMINB12, FOLATE, FERRITIN, TIBC, IRON, RETICCTPCT in the last 72 hours. Coags: No  results for input(s): INR in the last 72 hours.  Invalid input(s): PT Microbiology: Recent Results (from the past 240 hour(s))  Respiratory Panel by PCR     Status: Abnormal   Collection Time: 05/30/16 12:52 PM  Result Value Ref Range Status   Adenovirus NOT DETECTED NOT DETECTED Final   Coronavirus 229E NOT DETECTED NOT DETECTED Final   Coronavirus HKU1 NOT DETECTED NOT DETECTED Final   Coronavirus NL63 NOT DETECTED NOT DETECTED Final   Coronavirus OC43 NOT DETECTED NOT DETECTED Final   Metapneumovirus NOT DETECTED NOT DETECTED Final   Rhinovirus / Enterovirus NOT DETECTED NOT DETECTED Final   Influenza A NOT DETECTED NOT DETECTED Final   Influenza B NOT DETECTED NOT DETECTED Final   Parainfluenza Virus 1 NOT DETECTED NOT DETECTED Final   Parainfluenza Virus 2 NOT DETECTED NOT DETECTED Final   Parainfluenza Virus 3 NOT DETECTED NOT DETECTED Final   Parainfluenza Virus 4 NOT DETECTED NOT DETECTED Final   Respiratory Syncytial Virus DETECTED (A) NOT DETECTED Final    Comment: CRITICAL RESULT CALLED TO, READ BACK BY AND VERIFIED WITH: T. Fairing RN 15:50 05/30/16 ([email or web address])    Bordetella pertussis NOT DETECTED NOT DETECTED Final   Chlamydophila pneumoniae NOT DETECTED NOT DETECTED Final   Mycoplasma pneumoniae NOT DETECTED NOT DETECTED Final    FURTHER DISCHARGE INSTRUCTIONS:  Get Medicines reviewed and  adjusted: Please take all your medications with you for your next visit with your Primary MD  Laboratory/radiological data: Please request your Primary MD to go over all hospital tests and procedure/radiological results at the follow up, please ask your Primary MD to get all Hospital records sent to his/her office.  In some cases, they will be blood work, cultures and biopsy results pending at the time of your discharge. Please request that your primary care M.D. goes through all the records of your hospital data and follows up on these results.  Also Note the following: If you experience worsening of your admission symptoms, develop shortness of breath, life threatening emergency, suicidal or homicidal thoughts you must seek medical attention immediately by calling 911 or calling your MD immediately  if symptoms less severe.  You must read complete instructions/literature along with all the possible adverse reactions/side effects for all the Medicines you take and that have been prescribed to you. Take any new Medicines after you have completely understood and accpet all the possible adverse reactions/side effects.   Do not drive when taking Pain medications or sleeping medications (Benzodaizepines)  Do not take more than prescribed Pain, Sleep and Anxiety Medications. It is not advisable to combine anxiety,sleep and pain medications without talking with your primary care practitioner  Special Instructions: If you have smoked or chewed Tobacco  in the last 2 yrs please stop smoking, stop any regular Alcohol  and or any Recreational drug use.  Wear Seat belts while driving.  Please note: You were cared for by a hospitalist during your hospital stay. Once you are discharged, your primary care physician will handle any further medical issues. Please note that NO REFILLS for any discharge medications will be authorized once you are discharged, as it is imperative that you return to your primary  care physician (or establish a relationship with a primary care physician if you do not have one) for your post hospital discharge needs so that they can reassess your need for medications and monitor your lab values.  Total Time spent coordinating discharge including counseling, education and face to face time equals  45 minutes.  SignedOren Binet 06/05/2016 11:30 AM

## 2016-06-03 MED ORDER — LISINOPRIL 10 MG PO TABS: 20.0000 mg | ORAL_TABLET | Freq: Every day | ORAL | Status: AC

## 2016-06-03 MED ORDER — METHYLPREDNISOLONE SODIUM SUCC 125 MG IJ SOLR
40.0000 mg | Freq: Two times a day (BID) | INTRAMUSCULAR | Status: DC
  Administered 2016-06-03 – 2016-06-04 (×2): 40 mg via INTRAVENOUS
  Filled 2016-06-03 (×2): qty 2

## 2016-06-03 MED ORDER — LISINOPRIL 20 MG PO TABS
20.0000 mg | ORAL_TABLET | Freq: Every day | ORAL | Status: DC
  Administered 2016-06-03: 20 mg via ORAL
  Filled 2016-06-03: qty 1

## 2016-06-03 NOTE — Plan of Care (Signed)
Problem: Pain Managment: Goal: General experience of comfort will improve Outcome: Progressing Denies pain and discomfort  Problem: Tissue Perfusion: Goal: Risk factors for ineffective tissue perfusion will decrease Outcome: Progressing No S/S of DVT, on Heparin SQ  Problem: Activity: Goal: Risk for activity intolerance will decrease Outcome: Progressing Up to BR with minimal assistance, tolerates well  Problem: Bowel/Gastric: Goal: Will not experience complications related to bowel motility Outcome: Progressing No gastric or bowel issues noted

## 2016-06-03 NOTE — Progress Notes (Addendum)
PROGRESS NOTE        PATIENT DETAILS Name: Cheryl Lane Age: 66 y.o. Sex: female Date of Birth: 1950/05/27 Admit Date: 05/29/2016 Admitting Physician Waldemar Dickens, MD JFH:[id number],LSLHTDS E, MD  Brief Narrative: Patient is a 66 y.o. female with PMH significant of HTN, asthma, CKD 2-3, and radiculopathy. She was recently diagnosed with influenza. She was admitted on 2/5 with several days history of worsening cough, shortness of breath. She was thought to have acute bronchitis, and admitted for further evaluation and treatment. See below for further details.   Subjective:  Slowly improving-very weak and deconditioned-still coughing but claims that she is better than the past few days.  Assessment/Plan: Acute hypoxic respiratory failure secondary to acute bronchitis: Secondary to RSV-slowly improving-continues to have a few scattered rhonchi. Continue nebulized bronchodilators, further taper down Solu-Medrol.Continue anti-sinus therapy with afrin, Claritin and Flonase. Awaiting SNF bed  Chronic kidney disease stage 2-3, per labs dating back to 2015: creatinine back to her baseline  Essential Hypertension: Blood pressure on the higher side-increase lisinopril to 20 mg daily.  Chronic left shoulder pain: Continue Utram.   Depression: appears stable-Continue Lexapro.   Anxiety: Appears stable, continue Xanax  DVT Prophylaxis: Prophylactic Heparin [place]  Code Status: Full code  Family Communication: None at bedside  Disposition Plan: Remain inpatient-SNF when bed available  Antimicrobial agents: Anti-infectives    None     Procedures: None  CONSULTS:  None  Time spent: 25- minutes-Greater than 50% of this time was spent in counseling, explanation of diagnosis, planning of further management, and coordination of care.  MEDICATIONS: Scheduled Meds: . budesonide (PULMICORT) nebulizer solution  0.25 mg Nebulization BID  . escitalopram  10 mg  Oral Daily  . fluticasone  2 spray Each Nare Daily  . guaiFENesin  600 mg Oral BID  . heparin  5,000 Units Subcutaneous Q8H  . levalbuterol  0.63 mg Nebulization Q6H  . levalbuterol  1.25 mg Nebulization Once  . lisinopril  20 mg Oral Daily  . loratadine  10 mg Oral Daily  . methylPREDNISolone (SOLU-MEDROL) injection  40 mg Intravenous Q8H   Continuous Infusions: PRN Meds:.acetaminophen **OR** acetaminophen, albuterol, ALPRAZolam, benzonatate, bisacodyl, HYDROcodone-acetaminophen, magnesium citrate, metoCLOPramide (REGLAN) injection, senna-docusate, traMADol, traZODone   PHYSICAL EXAM: Vital signs: Vitals:   06/02/16 2052 06/03/16 0211 06/03/16 0444 06/03/16 0631  BP:   (!) 184/77 (!) 162/75  Pulse:   (!) 57 (!) 58  Resp:   18 18  Temp:   97.9 F (36.6 C) 97.9 F (36.6 C)  TempSrc:   Oral Oral  SpO2: 96% 94% 94% 97%  Weight:   115 kg (253 lb 9.6 oz)   Height:       Filed Weights   05/31/16 0301 06/01/16 0500 06/03/16 0444  Weight: 117.3 kg (258 lb 9.6 oz) 117.6 kg (259 lb 4.2 oz) 115 kg (253 lb 9.6 oz)   Body mass index is 39.72 kg/m.   General appearance :Awake, alert, not in any distress. Speech Clear. Looks weak and deconditioned Eyes:, pupils equally reactive to light and accomodation,no scleral icterus.Pink conjunctiva HEENT: Atraumatic and Normocephalic Neck: supple, no JVD. No cervical lymphadenopathy. No thyromegaly Resp:Good air entry bilaterally, Very few scattered rhonchi CVS: S1 S2 regular, no murmurs.  GI: Bowel sounds present, Non tender and not distended with no gaurding, rigidity or rebound.No organomegaly Extremities: B/L Lower Ext  shows no edema, both legs are warm to touch Neurology:  speech clear,Non focal, sensation is grossly intact. Psychiatric: Normal judgment and insight. Alert and oriented x 3. Normal mood. Musculoskeletal:No digital cyanosis Skin:No Rash, warm and dry Wounds:N/A  I have personally reviewed following labs and imaging  studies  LABORATORY DATA: CBC:  Recent Labs Lab 05/29/16 1934 05/31/16 0537  WBC 3.9* 5.5  NEUTROABS 1.6*  --   HGB 13.4 11.7*  HCT 41.3 37.6  MCV 87.5 89.1  PLT 251 109    Basic Metabolic Panel:  Recent Labs Lab 05/29/16 1934 05/31/16 0537  NA 138 139  K 3.4* 3.8  CL 102 102  CO2 26 27  GLUCOSE 113* 115*  BUN 15 19  CREATININE 1.22* 1.12*  CALCIUM 8.8* 8.4*    GFR: Estimated Creatinine Clearance: 63 mL/min (by C-G formula based on SCr of 1.12 mg/dL (H)).  Liver Function Tests:  Recent Labs Lab 05/29/16 1934 05/31/16 0537  AST 22 30  ALT 10* 13*  ALKPHOS 65 60  BILITOT 0.8 0.5  PROT 7.7 6.5  ALBUMIN 3.8 3.2*   No results for input(s): LIPASE, AMYLASE in the last 168 hours. No results for input(s): AMMONIA in the last 168 hours.  Coagulation Profile: No results for input(s): INR, PROTIME in the last 168 hours.  Cardiac Enzymes:  Recent Labs Lab 05/29/16 1934  TROPONINI <0.03    BNP (last 3 results) No results for input(s): PROBNP in the last 8760 hours.  HbA1C: No results for input(s): HGBA1C in the last 72 hours.  CBG: No results for input(s): GLUCAP in the last 168 hours.  Lipid Profile: No results for input(s): CHOL, HDL, LDLCALC, TRIG, CHOLHDL, LDLDIRECT in the last 72 hours.  Thyroid Function Tests: No results for input(s): TSH, T4TOTAL, FREET4, T3FREE, THYROIDAB in the last 72 hours.  Anemia Panel: No results for input(s): VITAMINB12, FOLATE, FERRITIN, TIBC, IRON, RETICCTPCT in the last 72 hours.  Urine analysis: No results found for: COLORURINE, APPEARANCEUR, LABSPEC, PHURINE, GLUCOSEU, HGBUR, BILIRUBINUR, KETONESUR, PROTEINUR, UROBILINOGEN, NITRITE, LEUKOCYTESUR  Sepsis Labs: Lactic Acid, Venous No results found for: LATICACIDVEN  MICROBIOLOGY: Recent Results (from the past 240 hour(s))  Respiratory Panel by PCR     Status: Abnormal   Collection Time: 05/30/16 12:52 PM  Result Value Ref Range Status   Adenovirus NOT  DETECTED NOT DETECTED Final   Coronavirus 229E NOT DETECTED NOT DETECTED Final   Coronavirus HKU1 NOT DETECTED NOT DETECTED Final   Coronavirus NL63 NOT DETECTED NOT DETECTED Final   Coronavirus OC43 NOT DETECTED NOT DETECTED Final   Metapneumovirus NOT DETECTED NOT DETECTED Final   Rhinovirus / Enterovirus NOT DETECTED NOT DETECTED Final   Influenza A NOT DETECTED NOT DETECTED Final   Influenza B NOT DETECTED NOT DETECTED Final   Parainfluenza Virus 1 NOT DETECTED NOT DETECTED Final   Parainfluenza Virus 2 NOT DETECTED NOT DETECTED Final   Parainfluenza Virus 3 NOT DETECTED NOT DETECTED Final   Parainfluenza Virus 4 NOT DETECTED NOT DETECTED Final   Respiratory Syncytial Virus DETECTED (A) NOT DETECTED Final    Comment: CRITICAL RESULT CALLED TO, READ BACK BY AND VERIFIED WITH: T. Fairing RN 15:50 05/30/16 ([email or web address])    Bordetella pertussis NOT DETECTED NOT DETECTED Final   Chlamydophila pneumoniae NOT DETECTED NOT DETECTED Final   Mycoplasma pneumoniae NOT DETECTED NOT DETECTED Final    RADIOLOGY STUDIES/RESULTS: X-ray Chest Pa And Lateral  Result Date: 05/31/2016 CLINICAL DATA:  Flu symptoms requiring admission 2 days ago. Persistent  symptoms and weakness. History of asthma, chronic renal insufficiency EXAM: CHEST  2 VIEW COMPARISON:  Chest x-ray of May 29, 2016 FINDINGS: The lungs are adequately inflated. The interstitial markings are coarse though stable. There is no alveolar infiltrate. There is no pleural effusion. The heart and pulmonary vascularity are normal. The mediastinum is normal in width. The bony thorax exhibits no acute abnormality. 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 IMPRESSION: Chronic interstitial prominence, stable. No acute pneumonia nor CHF. Electronically Signed   By: David  Martinique M.D.   On: 05/31/2016 08:49   Dg Chest 2 View  Result Date: 05/29/2016 CLINICAL DATA:  66 year old who states she was diagnosed with influenza 1 week  ago, presenting now with worsening cough and shortness of breath associated with mid chest pain. EXAM: CHEST  2 VIEW COMPARISON:  12/29/2015, 12/21/2015 and earlier, including CT chest 12/22/2015. FINDINGS: Cardiac silhouette normal in size, unchanged. Thoracic aorta tortuous and atherosclerotic, unchanged. Moderate-sized hiatal hernia, unchanged. Hilar and mediastinal contours otherwise unremarkable. Mild to moderate central peribronchial thickening, more so than on the most recent prior examination. Lungs otherwise clear. No localized airspace consolidation. No pleural effusions. No pneumothorax. Normal pulmonary vascularity. Degenerative changes involving the thoracic spine. IMPRESSION: 1. Mild-to-moderate changes of acute bronchitis and/or asthma without focal airspace pneumonia. 2. Moderate-sized hiatal hernia, unchanged. Electronically Signed   By: Evangeline Dakin M.D.   On: 05/29/2016 20:04     LOS: 4 days   Nena Alexander MD  If 7PM-7AM, please contact night-coverage [email or web address] Password [email or web address] 06/03/2016, 9:22 AM

## 2016-06-04 ENCOUNTER — Encounter (HOSPITAL_COMMUNITY): Payer: Self-pay | Admitting: *Deleted

## 2016-06-04 MED ORDER — PREDNISONE 20 MG PO TABS
40.0000 mg | ORAL_TABLET | Freq: Every day | ORAL | Status: DC
  Administered 2016-06-04 – 2016-06-05 (×2): 40 mg via ORAL
  Filled 2016-06-04 (×2): qty 2

## 2016-06-04 MED ORDER — AMLODIPINE BESYLATE 10 MG PO TABS
10.0000 mg | ORAL_TABLET | Freq: Every day | ORAL | Status: DC
  Administered 2016-06-04 – 2016-06-05 (×2): 10 mg via ORAL
  Filled 2016-06-04 (×2): qty 1

## 2016-06-04 MED ORDER — FUROSEMIDE 10 MG/ML IJ SOLN
20.0000 mg | Freq: Once | INTRAMUSCULAR | Status: AC
  Administered 2016-06-04: 20 mg via INTRAVENOUS
  Filled 2016-06-04: qty 2

## 2016-06-04 MED ORDER — AMLODIPINE BESYLATE 10 MG PO TABS: 10.0000 mg | ORAL_TABLET | Freq: Every day | ORAL | 0 refills | Status: DC

## 2016-06-04 NOTE — Plan of Care (Signed)
Problem: Safety: Goal: Ability to remain free from injury will improve Outcome: Progressing No safety issues noted, safety precautions maintained  Problem: Pain Managment: Goal: General experience of comfort will improve Outcome: Progressing Denies pain and discomfort  Problem: Skin Integrity: Goal: Risk for impaired skin integrity will decrease Outcome: Progressing No skin issues noted  Problem: Tissue Perfusion: Goal: Risk factors for ineffective tissue perfusion will decrease Outcome: Progressing On Heparin SQ, denies S/S of DVT  Problem: Bowel/Gastric: Goal: Will not experience complications related to bowel motility Outcome: Progressing No bowel or gastric issues noted

## 2016-06-04 NOTE — Progress Notes (Signed)
Sitting in chair at bedside Continues to cough-although better Claims she is "slowly getting there"-slowly improving Claims Xanax helps with spells of anxiety and SOB  Lungs-moving air well-some scattered rhonchi Ext-no edema  Imp-Acute RSV Bronchitis-slowly improving  Plan Due to persistent cough-stop Lisinopril and switch to Amlodipine Needs SNF for rehab-awaiting bed-per SW-needs a private room due to RSV See d/c summary for details.

## 2016-06-04 NOTE — Plan of Care (Signed)
Problem: Bowel/Gastric: Goal: Will not experience complications related to bowel motility Outcome: Progressing No complications noted

## 2016-06-05 DIAGNOSIS — R262 Difficulty in walking, not elsewhere classified: Secondary | ICD-10-CM | POA: Diagnosis not present

## 2016-06-05 DIAGNOSIS — J40 Bronchitis, not specified as acute or chronic: Secondary | ICD-10-CM | POA: Diagnosis not present

## 2016-06-05 DIAGNOSIS — R488 Other symbolic dysfunctions: Secondary | ICD-10-CM | POA: Diagnosis not present

## 2016-06-05 DIAGNOSIS — J9601 Acute respiratory failure with hypoxia: Secondary | ICD-10-CM | POA: Diagnosis not present

## 2016-06-05 DIAGNOSIS — I129 Hypertensive chronic kidney disease with stage 1 through stage 4 chronic kidney disease, or unspecified chronic kidney disease: Secondary | ICD-10-CM | POA: Diagnosis not present

## 2016-06-05 DIAGNOSIS — G894 Chronic pain syndrome: Secondary | ICD-10-CM | POA: Diagnosis not present

## 2016-06-05 DIAGNOSIS — N184 Chronic kidney disease, stage 4 (severe): Secondary | ICD-10-CM | POA: Diagnosis not present

## 2016-06-05 DIAGNOSIS — I10 Essential (primary) hypertension: Secondary | ICD-10-CM | POA: Diagnosis not present

## 2016-06-05 DIAGNOSIS — M25512 Pain in left shoulder: Secondary | ICD-10-CM | POA: Diagnosis not present

## 2016-06-05 DIAGNOSIS — G8929 Other chronic pain: Secondary | ICD-10-CM | POA: Diagnosis not present

## 2016-06-05 DIAGNOSIS — F329 Major depressive disorder, single episode, unspecified: Secondary | ICD-10-CM | POA: Diagnosis not present

## 2016-06-05 DIAGNOSIS — M6281 Muscle weakness (generalized): Secondary | ICD-10-CM | POA: Diagnosis not present

## 2016-06-05 DIAGNOSIS — R0602 Shortness of breath: Secondary | ICD-10-CM | POA: Diagnosis not present

## 2016-06-05 DIAGNOSIS — R531 Weakness: Secondary | ICD-10-CM | POA: Diagnosis not present

## 2016-06-05 DIAGNOSIS — R066 Hiccough: Secondary | ICD-10-CM | POA: Diagnosis not present

## 2016-06-05 DIAGNOSIS — F419 Anxiety disorder, unspecified: Secondary | ICD-10-CM | POA: Diagnosis not present

## 2016-06-05 DIAGNOSIS — J210 Acute bronchiolitis due to respiratory syncytial virus: Secondary | ICD-10-CM | POA: Diagnosis not present

## 2016-06-05 DIAGNOSIS — N183 Chronic kidney disease, stage 3 (moderate): Secondary | ICD-10-CM | POA: Diagnosis not present

## 2016-06-05 NOTE — Progress Notes (Signed)
Sitting in chair at bedside-main complaint is "weakness" Continues to cough-but slowly improving  Claims she is "slowly getting there"-slowly improving Claims Xanax helps with spells of anxiety and SOB  Lungs-moving air well-some scattered rhonchi Ext-no edema  Imp-Acute RSV Bronchitis-slowly improving  Plan Continue current care Awaiting SNF bed

## 2016-06-05 NOTE — Progress Notes (Signed)
Attempted to call report again to [employer], was connected to a line and no one picked up again.  Will try again.  Reviewed discharge instructions/medication with patient.  Patient is ready for discharge.  Waiting on transport.

## 2016-06-05 NOTE — Clinical Social Work Note (Addendum)
Clinical Social Worker facilitated patient discharge including contacting patient family and facility to confirm patient discharge plans.  Clinical information faxed to facility and family agreeable with plan.  CSW arranged ambulance transport via [place] (1:00) to [facility].  RN to call [phone] for report prior to discharge.Patient going to room 423.  Clinical Social Worker will sign off for now as social work intervention is no longer needed. Please consult [place] again if new need arises.  [address] [place]

## 2016-06-05 NOTE — Progress Notes (Signed)
Attempted to call report to [employer].  Was transferred to a line and no one picked up.  Will attempt to call report again before patient leaves.

## 2016-06-05 NOTE — Clinical Social Work Placement (Signed)
   CLINICAL SOCIAL WORK PLACEMENT  NOTE  Date:  06/05/2016  Patient Details  Name: Cheryl Lane MRN: [id number] Date of Birth: Feb 15, 1951  Clinical Social Work is seeking post-discharge placement for this patient at the [place] level of care (*CSW will initial, date and re-position this form in  chart as items are completed):      Patient/family provided with [facility] Work Department's list of facilities offering this level of care within the geographic area requested by the patient (or if unable, by the patient's family).  Yes   Patient/family informed of their freedom to choose among providers that offer the needed level of care, that participate in Medicare, Medicaid or managed care program needed by the patient, have an available bed and are willing to accept the patient.      Patient/family informed of [facility]'s ownership interest in [facility] and [facility], as well as of the fact that they are under no obligation to receive care at these facilities.  PASRR submitted to EDS on       PASRR number received on 06/01/16     Existing PASRR number confirmed on       FL2 transmitted to all facilities in geographic area requested by pt/family on 06/01/16     FL2 transmitted to all facilities within larger geographic area on       Patient informed that his/her managed care company has contracts with or will negotiate with certain facilities, including the following:        Yes   Patient/family informed of bed offers received.  Patient chooses bed at [facility]     Physician recommends and patient chooses bed at      Patient to be transferred to [facility] on 06/05/16.  Patient to be transferred to facility by [facility]     Patient family notified on 06/05/16 of transfer.  Name of family member notified:  [name]     PHYSICIAN Please prepare prescriptions     Additional Comment:     _______________________________________________ Alla German, LCSW 06/05/2016, 11:36 AM

## 2016-06-06 ENCOUNTER — Encounter: Payer: Self-pay | Admitting: Internal Medicine

## 2016-06-06 ENCOUNTER — Non-Acute Institutional Stay (SKILLED_NURSING_FACILITY): Payer: Medicare Other | Admitting: Internal Medicine

## 2016-06-06 DIAGNOSIS — F32A Depression, unspecified: Secondary | ICD-10-CM

## 2016-06-06 DIAGNOSIS — N183 Chronic kidney disease, stage 3 unspecified: Secondary | ICD-10-CM

## 2016-06-06 DIAGNOSIS — F329 Major depressive disorder, single episode, unspecified: Secondary | ICD-10-CM

## 2016-06-06 DIAGNOSIS — F419 Anxiety disorder, unspecified: Secondary | ICD-10-CM | POA: Diagnosis not present

## 2016-06-06 DIAGNOSIS — J9601 Acute respiratory failure with hypoxia: Secondary | ICD-10-CM | POA: Diagnosis not present

## 2016-06-06 DIAGNOSIS — J210 Acute bronchiolitis due to respiratory syncytial virus: Secondary | ICD-10-CM | POA: Diagnosis not present

## 2016-06-06 DIAGNOSIS — I10 Essential (primary) hypertension: Secondary | ICD-10-CM | POA: Diagnosis not present

## 2016-06-06 DIAGNOSIS — M25512 Pain in left shoulder: Secondary | ICD-10-CM | POA: Diagnosis not present

## 2016-06-06 DIAGNOSIS — J40 Bronchitis, not specified as acute or chronic: Secondary | ICD-10-CM

## 2016-06-06 DIAGNOSIS — M1990 Unspecified osteoarthritis, unspecified site: Secondary | ICD-10-CM | POA: Insufficient documentation

## 2016-06-06 NOTE — Progress Notes (Signed)
: Provider:  Noah Delaine. Sheppard Coil, MD Location:  [facility] Room Number: [phone] Place of Service:  SNF [phone])  PCP: Nicoletta Dress, MD Patient Care Team: Nicoletta Dress, MD as PCP - General (Internal Medicine)  Extended Emergency Contact Information Primary Emergency Contact: [place] Phone: [phone] Mobile Phone: [phone] Relation: Daughter     Allergies: Patient has no known allergies.  Chief Complaint  Patient presents with  . New Admit To SNF    Admit to Facility    HPI: Patient is 66 y.o. female with HTN, asthma, CKD 2-3, and radiculopathy. She was recently diagnosed with influenza. She was admitted on 2/5 with several days history of worsening cough, shortness of breath. She was thought to have acute bronchitis, and admitted to [facility] from 2/5-12 where pt was dx with acute hypoxic resp failure 2/2 acute bronchitis 2/2 RSV. Pt was treated with steroids and bronchodilators with improvement. Pt is admitted to SNF with generalized weakness for OT/PT. While at SNF pt will be followed for HTN, tx with norvasc, and lisinopril, Depression, tx with lexapro and anxiety, tx with xanax.   Past Medical History:  Diagnosis Date  . Arthritis   . Asthma   . Cervical radiculopathy   . Chronic pain in left shoulder   . CKD (chronic kidney disease)   . Depression   . Hypertension     Past Surgical History:  Procedure Laterality Date  . APPENDECTOMY      Allergies as of 06/06/2016   No Known Allergies     Medication List       Accurate as of 06/06/16  9:21 AM. Always use your most recent med list.          albuterol 108 (90 Base) MCG/ACT inhaler Commonly known as:  PROVENTIL HFA;VENTOLIN HFA Inhale 2 puffs into the lungs every 4 (four) hours as needed for wheezing or shortness of breath.   albuterol (2.5 MG/3ML) 0.083% nebulizer solution Commonly known as:  PROVENTIL Take 3 mLs (2.5 mg total) by  nebulization every 4 (four) hours as needed for wheezing or shortness of breath.   ALPRAZolam 0.25 MG tablet Commonly known as:  XANAX Take 1 tablet (0.25 mg total) by mouth 3 (three) times daily as needed for anxiety.   amLODipine 10 MG tablet Commonly known as:  NORVASC Take 1 tablet (10 mg total) by mouth daily.   benzonatate 200 MG capsule Commonly known as:  TESSALON Take 1 capsule (200 mg total) by mouth 3 (three) times daily as needed for cough.   escitalopram 10 MG tablet Commonly known as:  LEXAPRO Take 10 mg by mouth daily.   fluticasone 50 MCG/ACT nasal spray Commonly known as:  FLONASE Place 2 sprays into both nostrils daily.   guaiFENesin 600 MG 12 hr tablet Commonly known as:  MUCINEX Take 1 tablet (600 mg total) by mouth 2 (two) times daily.   ipratropium-albuterol 0.5-2.5 (3) MG/3ML Soln Commonly known as:  DUONEB Take 3 mLs by nebulization every 8 (eight) hours.   lisinopril 10 MG tablet Commonly known as:  PRINIVIL,ZESTRIL Take 2 tablets (20 mg total) by mouth daily.   loratadine 10 MG tablet Commonly known as:  CLARITIN Take 1 tablet (10 mg total) by mouth daily.   oseltamivir 75 MG capsule Commonly known as:  TAMIFLU Take 75 mg by mouth daily.   predniSONE 10 MG tablet Commonly known as:  DELTASONE Take 4 tablets (40 mg)  daily for 2 days, then, Take 3 tablets (30 mg) daily for 2 days, then, Take 2 tablets (20 mg) daily for 2 days, then, Take 1 tablets (10 mg) daily for 1 days, then stop   traMADol 50 MG tablet Commonly known as:  ULTRAM Take 1 tablet (50 mg total) by mouth every 6 (six) hours as needed for moderate pain.       Meds ordered this encounter  Medications  . oseltamivir (TAMIFLU) 75 MG capsule    Sig: Take 75 mg by mouth daily.    Immunization History  Administered Date(s) Administered  . Influenza-Unspecified 02/29/2016    Social History  Substance Use Topics  . Smoking status: Never Smoker  . Smokeless tobacco: Never  Used  . Alcohol use No    Family history is   Family History  Problem Relation Age of Onset  . Hypertension Other       Review of Systems  DATA OBTAINED: from patient GENERAL:  no fevers, fatigue, appetite changes SKIN: No itching, or rash EYES: No eye pain, redness, discharge EARS: No earache, tinnitus, change in hearing NOSE: No congestion, drainage or bleeding  MOUTH/THROAT: No mouth or tooth pain, No sore throat RESPIRATORY: No cough, wheezing, SOB CARDIAC: No chest pain, palpitations, lower extremity edema  GI: No abdominal pain, No N/V/D or constipation, No heartburn or reflux  GU: No dysuria, frequency or urgency, or incontinence  MUSCULOSKELETAL: No unrelieved bone/joint pain NEUROLOGIC: No headache, dizziness or focal weakness PSYCHIATRIC: No c/o anxiety or sadness   Vitals:   06/06/16 0841  BP: (!) 149/83  Pulse: 78  Resp: 18  Temp: 98.4 F (36.9 C)    SpO2 Readings from Last 1 Encounters:  06/06/16 98%   Body mass index is 40.69 kg/m.     Physical Exam  GENERAL APPEARANCE: Alert, conversant,  No acute distress, wearing O2  SKIN: No diaphoresis rash HEAD: Normocephalic, atraumatic  EYES: Conjunctiva/lids clear. Pupils round, reactive. EOMs intact.  EARS: External exam WNL, canals clear. Hearing grossly normal.  NOSE: No deformity or discharge.  MOUTH/THROAT: Lips w/o lesions  RESPIRATORY: Breathing is even, unlabored. Lung sounds are diffusely decreased, slt rales R base CARDIOVASCULAR: Heart RRR no murmurs, rubs or gallops. No peripheral edema.   GASTROINTESTINAL: Abdomen is soft, non-tender, not distended w/ normal bowel sounds. GENITOURINARY: Bladder non tender, not distended  MUSCULOSKELETAL: No abnormal joints or musculature NEUROLOGIC:  Cranial nerves 2-12 grossly intact. Moves all extremities  PSYCHIATRIC: Mood and affect appropriate to situation, no behavioral issues  Patient Active Problem List   Diagnosis Date Noted  . Arthritis     . Depression 05/30/2016  . Chronic kidney disease (CKD), stage IV (severe) ([place]) 05/30/2016  . Mild intermittent asthma with acute exacerbation   . Respiratory distress   . CKD (chronic kidney disease) stage 2, GFR 60-89 ml/min   . Chronic pain syndrome   . Bronchitis 05/29/2016  . Left shoulder pain   . Opacity of lung on imaging study   . Shoulder pain, acute   . Weakness generalized 12/22/2015  . Essential hypertension 12/22/2015  . Pneumonia 12/22/2015  . PVC's (premature ventricular contractions) 12/22/2015  . CAP (community acquired pneumonia) 12/21/2015      Labs reviewed: Basic Metabolic Panel:    Component Value Date/Time   NA 139 05/31/2016 0537   NA 139 05/31/2016   K 3.8 05/31/2016 0537   CL 102 05/31/2016 0537   CO2 27 05/31/2016 0537   GLUCOSE 115 (H) 05/31/2016  0537   BUN 19 05/31/2016 0537   BUN 19 05/31/2016   CREATININE 1.12 (H) 05/31/2016 0537   CALCIUM 8.4 (L) 05/31/2016 0537   PROT 6.5 05/31/2016 0537   ALBUMIN 3.2 (L) 05/31/2016 0537   AST 30 05/31/2016 0537   ALT 13 (L) 05/31/2016 0537   ALKPHOS 60 05/31/2016 0537   BILITOT 0.5 05/31/2016 0537   GFRNONAA 49 (L) 05/31/2016 0537   GFRAA 57 (L) 05/31/2016 0537     Recent Labs  12/23/15 1028 12/24/15 0424 05/29/16 05/29/16 1934 05/31/16 05/31/16 0537  NA  --  139 138 138 139 139  K  --  4.1 3.4 3.4*  --  3.8  CL  --  106  --  102  --  102  CO2  --  26  --  26  --  27  GLUCOSE  --  105*  --  113*  --  115*  BUN  --  12 15 15 19 19   CREATININE  --  1.24* 1.2* 1.22* 1.1 1.12*  CALCIUM  --  8.9  --  8.8*  --  8.4*  MG 1.7 1.9  --   --   --   --    Liver Function Tests:  Recent Labs  12/22/15 0455 05/29/16 05/29/16 1934 05/31/16 0537  AST 20 22 22 30   ALT 12* 10 10* 13*  ALKPHOS 64 65 65 60  BILITOT 0.6  --  0.8 0.5  PROT 6.0*  --  7.7 6.5  ALBUMIN 3.0*  --  3.8 3.2*   No results for input(s): LIPASE, AMYLASE in the last 8760 hours. No results for input(s): AMMONIA in the last  8760 hours. CBC:  Recent Labs  12/21/15 2050 12/22/15 0455 12/23/15 0158 05/29/16 05/29/16 1934 05/31/16 05/31/16 0537  WBC 7.0 7.0 5.1 3.9 3.9* 5.5 5.5  NEUTROABS 3.9 4.2  --   --  1.6*  --   --   HGB 12.7 12.0 11.5* 13.4 13.4  --  11.7*  HCT 40.6 37.7 37.6 41 41.3  --  37.6  MCV 93.3 92.9 93.3  --  87.5  --  89.1  PLT 284 267 275 251 251  --  225   Lipid  Recent Labs  12/23/15 0158  CHOL 183  HDL 48  LDLCALC 119*  TRIG 81    Cardiac Enzymes:  Recent Labs  12/22/15 0455 12/22/15 1116 12/22/15 1548 05/29/16 1934  CKTOTAL 67  --   --   --   TROPONINI <0.03 <0.03 <0.03 <0.03   BNP: No results for input(s): BNP in the last 8760 hours. No results found for: MICROALBUR No results found for: HGBA1C Lab Results  Component Value Date   TSH 11.446 (H) 12/22/2015   No results found for: VITAMINB12 No results found for: FOLATE No results found for: IRON, TIBC, FERRITIN  Imaging and Procedures obtained prior to SNF admission: Dg Chest 2 View  Result Date: 05/29/2016 CLINICAL DATA:  66 year old who states she was diagnosed with influenza 1 week ago, presenting now with worsening cough and shortness of breath associated with mid chest pain. EXAM: CHEST  2 VIEW COMPARISON:  12/29/2015, 12/21/2015 and earlier, including CT chest 12/22/2015. FINDINGS: Cardiac silhouette normal in size, unchanged. Thoracic aorta tortuous and atherosclerotic, unchanged. Moderate-sized hiatal hernia, unchanged. Hilar and mediastinal contours otherwise unremarkable. Mild to moderate central peribronchial thickening, more so than on the most recent prior examination. Lungs otherwise clear. No localized airspace consolidation. No pleural effusions. No pneumothorax.  Normal pulmonary vascularity. Degenerative changes involving the thoracic spine. IMPRESSION: 1. Mild-to-moderate changes of acute bronchitis and/or asthma without focal airspace pneumonia. 2. Moderate-sized hiatal hernia, unchanged.  Electronically Signed   By: Evangeline Dakin M.D.   On: 05/29/2016 20:04     Not all labs, radiology exams or other studies done during hospitalization come through on my EPIC note; however they are reviewed by me.    Assessment and Plan  ACUTE HYPOXIC RESP FAILURE/ACUTE BRONCHITIS. RSV- tx with steroids and bronchodilators with improvement; pt on O2 SNF - cont duoneb q8, prn albuterol nebs, tessalon perles and mucinex 60 mg q12 along with steroid tapers  CKD3 - Cr at baseline GFR 57, Cr 1.12;  SNF - f/u BMP  HTN SNF - pt was d/c with norvsc AND lisinopril;will check on this;pt was on lisinopril prior to hospitalization so don't think it's causing cough  CHRONIC L SHOULDER PAIN SNF - cont ultram for pain  DEPRESSION SNF - stable; cont lexapro 10 mg daily  ANXIETY SNF - controlled; cont xanax 0.25 mg TID    Time spent > 45 min;> 50% of time with patient was spent reviewing records, labs, tests and studies, counseling and developing plan of care  Webb Silversmith D. Sheppard Coil, MD

## 2016-06-07 ENCOUNTER — Encounter: Payer: Self-pay | Admitting: Internal Medicine

## 2016-06-07 DIAGNOSIS — J9601 Acute respiratory failure with hypoxia: Secondary | ICD-10-CM | POA: Insufficient documentation

## 2016-06-07 DIAGNOSIS — F419 Anxiety disorder, unspecified: Secondary | ICD-10-CM

## 2016-06-07 DIAGNOSIS — J210 Acute bronchiolitis due to respiratory syncytial virus: Secondary | ICD-10-CM | POA: Insufficient documentation

## 2016-06-07 HISTORY — DX: Acute respiratory failure with hypoxia: J96.01

## 2016-06-07 HISTORY — DX: Anxiety disorder, unspecified: F41.9

## 2016-06-09 LAB — CBC AND DIFFERENTIAL
HEMATOCRIT: 44 % (ref 36–46)
HEMOGLOBIN: 13.5 g/dL (ref 12.0–16.0)
PLATELETS: 297 10*3/uL (ref 150–399)
WBC: 6.5 10*3/mL

## 2016-06-09 LAB — BASIC METABOLIC PANEL
BUN: 23 mg/dL — AB (ref 4–21)
Creatinine: 1 mg/dL (ref 0.5–1.1)
Glucose: 112 mg/dL
Potassium: 4.1 mmol/L (ref 3.4–5.3)
Sodium: 142 mmol/L (ref 137–147)

## 2016-06-10 LAB — CBC AND DIFFERENTIAL
HEMATOCRIT: 43 % (ref 36–46)
Hemoglobin: 13.9 g/dL (ref 12.0–16.0)
PLATELETS: 280 10*3/uL (ref 150–399)
WBC: 7.1 10*3/mL

## 2016-06-10 LAB — BASIC METABOLIC PANEL
BUN: 21 mg/dL (ref 4–21)
Creatinine: 1 mg/dL (ref 0.5–1.1)
GLUCOSE: 123 mg/dL
POTASSIUM: 4.2 mmol/L (ref 3.4–5.3)
SODIUM: 140 mmol/L (ref 137–147)

## 2016-06-14 ENCOUNTER — Other Ambulatory Visit: Payer: Self-pay | Admitting: *Deleted

## 2016-06-14 NOTE — Patient Outreach (Signed)
[facility]) Care Management  06/14/2016  Shealee Yordy 28-Dec-1950 [id number]   Met with patient at bedside of facility. Patient reports she will live with her daughter when she discharges from the facility.  Patient states she was inpatient due to flu that turned into pneumonia.  She reports her daughter will assist her with transportation, meds and ADLs.   RNCM reviewed [facility] care management, patient states she does not feel she will benefit from services at this time but is happy to know they are available. She accepted a brochure for future reference and will review with daughter.  RNCM remined Lexine Baton, who is assisting with discharge that patient is eligible for [facility] program services and requested she refer patient if discharge needs change. Lexine Baton confirms plan is for patient to go home with daughter and she does not anticipate difficult discharge, agrees to refer as needed.  Royetta Crochet. Laymond Purser, RN, BSN, [place] [phone]) Business Cell  [phone]) Toll Free Office

## 2016-06-20 ENCOUNTER — Non-Acute Institutional Stay (SKILLED_NURSING_FACILITY): Payer: Medicare Other | Admitting: Internal Medicine

## 2016-06-20 ENCOUNTER — Encounter: Payer: Self-pay | Admitting: Internal Medicine

## 2016-06-20 DIAGNOSIS — F329 Major depressive disorder, single episode, unspecified: Secondary | ICD-10-CM

## 2016-06-20 DIAGNOSIS — G8929 Other chronic pain: Secondary | ICD-10-CM

## 2016-06-20 DIAGNOSIS — F32A Depression, unspecified: Secondary | ICD-10-CM

## 2016-06-20 DIAGNOSIS — N183 Chronic kidney disease, stage 3 unspecified: Secondary | ICD-10-CM

## 2016-06-20 DIAGNOSIS — I10 Essential (primary) hypertension: Secondary | ICD-10-CM | POA: Diagnosis not present

## 2016-06-20 DIAGNOSIS — G894 Chronic pain syndrome: Secondary | ICD-10-CM

## 2016-06-20 DIAGNOSIS — J9601 Acute respiratory failure with hypoxia: Secondary | ICD-10-CM

## 2016-06-20 DIAGNOSIS — J210 Acute bronchiolitis due to respiratory syncytial virus: Secondary | ICD-10-CM

## 2016-06-20 DIAGNOSIS — F419 Anxiety disorder, unspecified: Secondary | ICD-10-CM | POA: Diagnosis not present

## 2016-06-20 DIAGNOSIS — M25512 Pain in left shoulder: Secondary | ICD-10-CM

## 2016-06-20 NOTE — Progress Notes (Addendum)
Location:  [facility] Room Number: 100 Place of Service:  SNF (31) Cheryl Lane. Sheppard Coil, MD  PCP: Nicoletta Dress, MD Patient Care Team: Nicoletta Dress, MD as PCP - General (Internal Medicine)  Extended Emergency Contact Information Primary Emergency Contact: [place] Phone: [phone] Mobile Phone: [phone] Relation: Daughter  No Known Allergies  Chief Complaint  Patient presents with  . Discharge Note    Discharged from SNF    HPI:  66 y.o. female  withHTN, asthma, CKD 2-3, and radiculopathy. She was recently diagnosed with influenza. She was admitted on 2/5 with several days history of worsening cough, shortness of breath. She was thought to have acute bronchitis, and admitted to [facility] from 2/5-12 where pt was dx with acute hypoxic resp failure 2/2 acute bronchitis 2/2 RSV. Pt was treated with steroids and bronchodilators with improvement. Pt is admitted to SNF with generalized weakness for OT/PTand is now ready to be d/c to home.    Past Medical History:  Diagnosis Date  . Arthritis   . Asthma   . Cervical radiculopathy   . Chronic pain in left shoulder   . CKD (chronic kidney disease)   . Depression   . Hypertension     Past Surgical History:  Procedure Laterality Date  . APPENDECTOMY       reports that she has never smoked. She has never used smokeless tobacco. She reports that she does not drink alcohol or use drugs. Social History   Social History  . Marital status: Widowed    Spouse name: N/A  . Number of children: N/A  . Years of education: N/A   Occupational History  . Not on file.   Social History Main Topics  . Smoking status: Never Smoker  . Smokeless tobacco: Never Used  . Alcohol use No  . Drug use: No  . Sexual activity: Not on file   Other Topics Concern  . Not on file   Social History Narrative  . No narrative on file    Pertinent  Health Maintenance Due   Topic Date Due  . MAMMOGRAM  05/20/1998  . COLONOSCOPY  05/20/1998  . DEXA SCAN  05/20/2013  . PNA vac Low Risk Adult (1 of 2 - PCV13) 05/20/2013  . INFLUENZA VACCINE  Completed    Medications: Allergies as of 06/20/2016   No Known Allergies     Medication List       Accurate as of 06/20/16 12:28 PM. Always use your most recent med list.          albuterol 108 (90 Base) MCG/ACT inhaler Commonly known as:  PROVENTIL HFA;VENTOLIN HFA Inhale 2 puffs into the lungs every 4 (four) hours as needed for wheezing or shortness of breath.   albuterol (2.5 MG/3ML) 0.083% nebulizer solution Commonly known as:  PROVENTIL Take 3 mLs (2.5 mg total) by nebulization every 4 (four) hours as needed for wheezing or shortness of breath.   ALPRAZolam 0.25 MG tablet Commonly known as:  XANAX Take 1 tablet (0.25 mg total) by mouth 3 (three) times daily as needed for anxiety.   amLODipine 10 MG tablet Commonly known as:  NORVASC Take 1 tablet (10 mg total) by mouth daily.   benzonatate 200 MG capsule Commonly known as:  TESSALON Take 1 capsule (200 mg total) by mouth 3 (three) times daily as needed for cough.   clonazePAM 0.5 MG tablet Commonly known as:  KLONOPIN Take 0.5  mg by mouth at bedtime.   fluticasone 50 MCG/ACT nasal spray Commonly known as:  FLONASE Place 2 sprays into both nostrils daily.   guaiFENesin 600 MG 12 hr tablet Commonly known as:  MUCINEX Take 1 tablet (600 mg total) by mouth 2 (two) times daily.   ipratropium-albuterol 0.5-2.5 (3) MG/3ML Soln Commonly known as:  DUONEB Take 3 mLs by nebulization every 8 (eight) hours.   lisinopril 10 MG tablet Commonly known as:  PRINIVIL,ZESTRIL Take 2 tablets (20 mg total) by mouth daily.   loratadine 10 MG tablet Commonly known as:  CLARITIN Take 1 tablet (10 mg total) by mouth daily.   traMADol 50 MG tablet Commonly known as:  ULTRAM Take 1 tablet (50 mg total) by mouth every 6 (six) hours as needed for moderate  pain.        Vitals:   06/20/16 0937  BP: 125/60  Pulse: 94  Resp: 16  Temp: 97.6 F (36.4 C)  SpO2: 97%  Weight: 261 lb 3.2 oz (118.5 kg)  Height: 5\' 7"  (1.702 m)   Body mass index is 40.91 kg/m.  Physical Exam  GENERAL APPEARANCE: Alert, conversant. No acute distress.  HEENT: Unremarkable. RESPIRATORY: Breathing is even, unlabored. Lung sounds are diffusely decreased   CARDIOVASCULAR: Heart RRR no murmurs, rubs or gallops. No peripheral edema.  GASTROINTESTINAL: Abdomen is soft, non-tender, not distended w/ normal bowel sounds.  NEUROLOGIC: Cranial nerves 2-12 grossly intact. Moves all extremities   Labs reviewed: Basic Metabolic Panel:  Recent Labs  12/23/15 1028 12/24/15 0424  05/29/16 1934 05/31/16 05/31/16 0537 06/09/16  NA  --  139  < > 138 139 139 142  K  --  4.1  < > 3.4*  --  3.8 4.1  CL  --  106  --  102  --  102  --   CO2  --  26  --  26  --  27  --   GLUCOSE  --  105*  --  113*  --  115*  --   BUN  --  12  < > 15 19 19  23*  CREATININE  --  1.24*  < > 1.22* 1.1 1.12* 1.0  CALCIUM  --  8.9  --  8.8*  --  8.4*  --   MG 1.7 1.9  --   --   --   --   --   < > = values in this interval not displayed. No results found for: [facility] Liver Function Tests:  Recent Labs  12/22/15 0455 05/29/16 05/29/16 1934 05/31/16 0537  AST 20 22 22 30   ALT 12* 10 10* 13*  ALKPHOS 64 65 65 60  BILITOT 0.6  --  0.8 0.5  PROT 6.0*  --  7.7 6.5  ALBUMIN 3.0*  --  3.8 3.2*   No results for input(s): LIPASE, AMYLASE in the last 8760 hours. No results for input(s): AMMONIA in the last 8760 hours. CBC:  Recent Labs  12/21/15 2050 12/22/15 0455 12/23/15 0158  05/29/16 1934 05/31/16 05/31/16 0537 06/09/16  WBC 7.0 7.0 5.1  < > 3.9* 5.5 5.5 6.5  NEUTROABS 3.9 4.2  --   --  1.6*  --   --   --   HGB 12.7 12.0 11.5*  < > 13.4  --  11.7* 13.5  HCT 40.6 37.7 37.6  < > 41.3  --  37.6 44  MCV 93.3 92.9 93.3  --  87.5  --  89.1  --  PLT 284 267 275  < > 251  --  225  297  < > = values in this interval not displayed. Lipid  Recent Labs  12/23/15 0158  CHOL 183  HDL 48  LDLCALC 119*  TRIG 81   Cardiac Enzymes:  Recent Labs  12/22/15 0455 12/22/15 1116 12/22/15 1548 05/29/16 1934  CKTOTAL 67  --   --   --   TROPONINI <0.03 <0.03 <0.03 <0.03   BNP: No results for input(s): BNP in the last 8760 hours. CBG: No results for input(s): GLUCAP in the last 8760 hours.  Procedures and Imaging Studies During Stay: X-ray Chest Pa And Lateral  Result Date: 05/31/2016 CLINICAL DATA:  Flu symptoms requiring admission 2 days ago. Persistent symptoms and weakness. History of asthma, chronic renal insufficiency EXAM: CHEST  2 VIEW COMPARISON:  Chest x-ray of May 29, 2016 FINDINGS: The lungs are adequately inflated. The interstitial markings are coarse though stable. There is no alveolar infiltrate. There is no pleural effusion. The heart and pulmonary vascularity are normal. The mediastinum is normal in width. The bony thorax exhibits no acute abnormality. 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 IMPRESSION: Chronic interstitial prominence, stable. No acute pneumonia nor CHF. Electronically Signed   By: David  Martinique M.D.   On: 05/31/2016 08:49   Dg Chest 2 View  Result Date: 05/29/2016 CLINICAL DATA:  66 year old who states she was diagnosed with influenza 1 week ago, presenting now with worsening cough and shortness of breath associated with mid chest pain. EXAM: CHEST  2 VIEW COMPARISON:  12/29/2015, 12/21/2015 and earlier, including CT chest 12/22/2015. FINDINGS: Cardiac silhouette normal in size, unchanged. Thoracic aorta tortuous and atherosclerotic, unchanged. Moderate-sized hiatal hernia, unchanged. Hilar and mediastinal contours otherwise unremarkable. Mild to moderate central peribronchial thickening, more so than on the most recent prior examination. Lungs otherwise clear. No localized airspace consolidation. No pleural  effusions. No pneumothorax. Normal pulmonary vascularity. Degenerative changes involving the thoracic spine. IMPRESSION: 1. Mild-to-moderate changes of acute bronchitis and/or asthma without focal airspace pneumonia. 2. Moderate-sized hiatal hernia, unchanged. Electronically Signed   By: Evangeline Dakin M.D.   On: 05/29/2016 20:04    Assessment/Plan:   Acute respiratory failure with hypoxia (HCC)  RSV (acute bronchiolitis due to respiratory syncytial virus)  CKD (chronic kidney disease) stage 3, GFR 30-59 ml/min  Essential hypertension  Chronic pain syndrome  Chronic left shoulder pain  Depression, unspecified depression type  Anxiety   Patient is being discharged with the following home health services:  Ot/PT/Nursing  Patient is being discharged with the following durable medical equipment:  none  Patient has been advised to f/u with their PCP in 1-2 weeks to bring them up to date on their rehab stay.  Social services at facility was responsible for arranging this appointment.  Pt was provided with a 30 day supply of prescriptions for medications and refills must be obtained from their PCP.  For controlled substances, a more limited supply may be provided adequate until PCP appointment only.   Time spent . 30 min;> 50% of time with patient was spent reviewing records, labs, tests and studies, counseling and developing plan of care  Cheryl Lane. Sheppard Coil, MD

## 2016-06-26 DIAGNOSIS — I129 Hypertensive chronic kidney disease with stage 1 through stage 4 chronic kidney disease, or unspecified chronic kidney disease: Secondary | ICD-10-CM | POA: Diagnosis not present

## 2016-06-26 DIAGNOSIS — M1990 Unspecified osteoarthritis, unspecified site: Secondary | ICD-10-CM | POA: Diagnosis not present

## 2016-06-26 DIAGNOSIS — F329 Major depressive disorder, single episode, unspecified: Secondary | ICD-10-CM | POA: Diagnosis not present

## 2016-06-26 DIAGNOSIS — J441 Chronic obstructive pulmonary disease with (acute) exacerbation: Secondary | ICD-10-CM | POA: Diagnosis not present

## 2016-06-26 DIAGNOSIS — N184 Chronic kidney disease, stage 4 (severe): Secondary | ICD-10-CM | POA: Diagnosis not present

## 2016-06-26 DIAGNOSIS — M6281 Muscle weakness (generalized): Secondary | ICD-10-CM | POA: Diagnosis not present

## 2016-06-27 DIAGNOSIS — N184 Chronic kidney disease, stage 4 (severe): Secondary | ICD-10-CM | POA: Diagnosis not present

## 2016-06-27 DIAGNOSIS — J441 Chronic obstructive pulmonary disease with (acute) exacerbation: Secondary | ICD-10-CM | POA: Diagnosis not present

## 2016-06-27 DIAGNOSIS — F329 Major depressive disorder, single episode, unspecified: Secondary | ICD-10-CM | POA: Diagnosis not present

## 2016-06-27 DIAGNOSIS — M1990 Unspecified osteoarthritis, unspecified site: Secondary | ICD-10-CM | POA: Diagnosis not present

## 2016-06-27 DIAGNOSIS — M6281 Muscle weakness (generalized): Secondary | ICD-10-CM | POA: Diagnosis not present

## 2016-06-27 DIAGNOSIS — I129 Hypertensive chronic kidney disease with stage 1 through stage 4 chronic kidney disease, or unspecified chronic kidney disease: Secondary | ICD-10-CM | POA: Diagnosis not present

## 2016-06-28 DIAGNOSIS — F329 Major depressive disorder, single episode, unspecified: Secondary | ICD-10-CM | POA: Diagnosis not present

## 2016-06-28 DIAGNOSIS — J441 Chronic obstructive pulmonary disease with (acute) exacerbation: Secondary | ICD-10-CM | POA: Diagnosis not present

## 2016-06-28 DIAGNOSIS — N184 Chronic kidney disease, stage 4 (severe): Secondary | ICD-10-CM | POA: Diagnosis not present

## 2016-06-28 DIAGNOSIS — M6281 Muscle weakness (generalized): Secondary | ICD-10-CM | POA: Diagnosis not present

## 2016-06-28 DIAGNOSIS — M1990 Unspecified osteoarthritis, unspecified site: Secondary | ICD-10-CM | POA: Diagnosis not present

## 2016-06-28 DIAGNOSIS — I129 Hypertensive chronic kidney disease with stage 1 through stage 4 chronic kidney disease, or unspecified chronic kidney disease: Secondary | ICD-10-CM | POA: Diagnosis not present

## 2016-06-30 DIAGNOSIS — J441 Chronic obstructive pulmonary disease with (acute) exacerbation: Secondary | ICD-10-CM | POA: Diagnosis not present

## 2016-06-30 DIAGNOSIS — M1990 Unspecified osteoarthritis, unspecified site: Secondary | ICD-10-CM | POA: Diagnosis not present

## 2016-06-30 DIAGNOSIS — M6281 Muscle weakness (generalized): Secondary | ICD-10-CM | POA: Diagnosis not present

## 2016-06-30 DIAGNOSIS — I129 Hypertensive chronic kidney disease with stage 1 through stage 4 chronic kidney disease, or unspecified chronic kidney disease: Secondary | ICD-10-CM | POA: Diagnosis not present

## 2016-06-30 DIAGNOSIS — N184 Chronic kidney disease, stage 4 (severe): Secondary | ICD-10-CM | POA: Diagnosis not present

## 2016-06-30 DIAGNOSIS — F329 Major depressive disorder, single episode, unspecified: Secondary | ICD-10-CM | POA: Diagnosis not present

## 2016-07-04 DIAGNOSIS — N184 Chronic kidney disease, stage 4 (severe): Secondary | ICD-10-CM | POA: Diagnosis not present

## 2016-07-04 DIAGNOSIS — M6281 Muscle weakness (generalized): Secondary | ICD-10-CM | POA: Diagnosis not present

## 2016-07-04 DIAGNOSIS — I129 Hypertensive chronic kidney disease with stage 1 through stage 4 chronic kidney disease, or unspecified chronic kidney disease: Secondary | ICD-10-CM | POA: Diagnosis not present

## 2016-07-04 DIAGNOSIS — J441 Chronic obstructive pulmonary disease with (acute) exacerbation: Secondary | ICD-10-CM | POA: Diagnosis not present

## 2016-07-04 DIAGNOSIS — F329 Major depressive disorder, single episode, unspecified: Secondary | ICD-10-CM | POA: Diagnosis not present

## 2016-07-04 DIAGNOSIS — M1990 Unspecified osteoarthritis, unspecified site: Secondary | ICD-10-CM | POA: Diagnosis not present

## 2016-07-05 DIAGNOSIS — I129 Hypertensive chronic kidney disease with stage 1 through stage 4 chronic kidney disease, or unspecified chronic kidney disease: Secondary | ICD-10-CM | POA: Diagnosis not present

## 2016-07-05 DIAGNOSIS — F329 Major depressive disorder, single episode, unspecified: Secondary | ICD-10-CM | POA: Diagnosis not present

## 2016-07-05 DIAGNOSIS — G894 Chronic pain syndrome: Secondary | ICD-10-CM | POA: Diagnosis not present

## 2016-07-05 DIAGNOSIS — Z23 Encounter for immunization: Secondary | ICD-10-CM | POA: Diagnosis not present

## 2016-07-05 DIAGNOSIS — M25512 Pain in left shoulder: Secondary | ICD-10-CM | POA: Diagnosis not present

## 2016-07-05 DIAGNOSIS — E119 Type 2 diabetes mellitus without complications: Secondary | ICD-10-CM | POA: Diagnosis not present

## 2016-07-05 DIAGNOSIS — E785 Hyperlipidemia, unspecified: Secondary | ICD-10-CM | POA: Diagnosis not present

## 2016-07-05 DIAGNOSIS — M1990 Unspecified osteoarthritis, unspecified site: Secondary | ICD-10-CM | POA: Diagnosis not present

## 2016-07-05 DIAGNOSIS — R946 Abnormal results of thyroid function studies: Secondary | ICD-10-CM | POA: Diagnosis not present

## 2016-07-05 DIAGNOSIS — N184 Chronic kidney disease, stage 4 (severe): Secondary | ICD-10-CM | POA: Diagnosis not present

## 2016-07-05 DIAGNOSIS — M6281 Muscle weakness (generalized): Secondary | ICD-10-CM | POA: Diagnosis not present

## 2016-07-05 DIAGNOSIS — J441 Chronic obstructive pulmonary disease with (acute) exacerbation: Secondary | ICD-10-CM | POA: Diagnosis not present

## 2016-07-05 DIAGNOSIS — N183 Chronic kidney disease, stage 3 (moderate): Secondary | ICD-10-CM | POA: Diagnosis not present

## 2016-07-05 DIAGNOSIS — Z9181 History of falling: Secondary | ICD-10-CM | POA: Diagnosis not present

## 2016-07-05 DIAGNOSIS — Z6841 Body Mass Index (BMI) 40.0 and over, adult: Secondary | ICD-10-CM | POA: Diagnosis not present

## 2016-07-05 DIAGNOSIS — J208 Acute bronchitis due to other specified organisms: Secondary | ICD-10-CM | POA: Diagnosis not present

## 2016-07-06 DIAGNOSIS — N184 Chronic kidney disease, stage 4 (severe): Secondary | ICD-10-CM | POA: Diagnosis not present

## 2016-07-06 DIAGNOSIS — F329 Major depressive disorder, single episode, unspecified: Secondary | ICD-10-CM | POA: Diagnosis not present

## 2016-07-06 DIAGNOSIS — M1990 Unspecified osteoarthritis, unspecified site: Secondary | ICD-10-CM | POA: Diagnosis not present

## 2016-07-06 DIAGNOSIS — I129 Hypertensive chronic kidney disease with stage 1 through stage 4 chronic kidney disease, or unspecified chronic kidney disease: Secondary | ICD-10-CM | POA: Diagnosis not present

## 2016-07-06 DIAGNOSIS — M6281 Muscle weakness (generalized): Secondary | ICD-10-CM | POA: Diagnosis not present

## 2016-07-06 DIAGNOSIS — J441 Chronic obstructive pulmonary disease with (acute) exacerbation: Secondary | ICD-10-CM | POA: Diagnosis not present

## 2016-07-07 DIAGNOSIS — F329 Major depressive disorder, single episode, unspecified: Secondary | ICD-10-CM | POA: Diagnosis not present

## 2016-07-07 DIAGNOSIS — J441 Chronic obstructive pulmonary disease with (acute) exacerbation: Secondary | ICD-10-CM | POA: Diagnosis not present

## 2016-07-07 DIAGNOSIS — M1990 Unspecified osteoarthritis, unspecified site: Secondary | ICD-10-CM | POA: Diagnosis not present

## 2016-07-07 DIAGNOSIS — M6281 Muscle weakness (generalized): Secondary | ICD-10-CM | POA: Diagnosis not present

## 2016-07-07 DIAGNOSIS — N184 Chronic kidney disease, stage 4 (severe): Secondary | ICD-10-CM | POA: Diagnosis not present

## 2016-07-07 DIAGNOSIS — I129 Hypertensive chronic kidney disease with stage 1 through stage 4 chronic kidney disease, or unspecified chronic kidney disease: Secondary | ICD-10-CM | POA: Diagnosis not present

## 2016-07-10 DIAGNOSIS — M1990 Unspecified osteoarthritis, unspecified site: Secondary | ICD-10-CM | POA: Diagnosis not present

## 2016-07-10 DIAGNOSIS — J441 Chronic obstructive pulmonary disease with (acute) exacerbation: Secondary | ICD-10-CM | POA: Diagnosis not present

## 2016-07-10 DIAGNOSIS — I129 Hypertensive chronic kidney disease with stage 1 through stage 4 chronic kidney disease, or unspecified chronic kidney disease: Secondary | ICD-10-CM | POA: Diagnosis not present

## 2016-07-10 DIAGNOSIS — M6281 Muscle weakness (generalized): Secondary | ICD-10-CM | POA: Diagnosis not present

## 2016-07-10 DIAGNOSIS — F329 Major depressive disorder, single episode, unspecified: Secondary | ICD-10-CM | POA: Diagnosis not present

## 2016-07-10 DIAGNOSIS — N184 Chronic kidney disease, stage 4 (severe): Secondary | ICD-10-CM | POA: Diagnosis not present

## 2016-07-11 DIAGNOSIS — N184 Chronic kidney disease, stage 4 (severe): Secondary | ICD-10-CM | POA: Diagnosis not present

## 2016-07-11 DIAGNOSIS — I129 Hypertensive chronic kidney disease with stage 1 through stage 4 chronic kidney disease, or unspecified chronic kidney disease: Secondary | ICD-10-CM | POA: Diagnosis not present

## 2016-07-11 DIAGNOSIS — F329 Major depressive disorder, single episode, unspecified: Secondary | ICD-10-CM | POA: Diagnosis not present

## 2016-07-11 DIAGNOSIS — M6281 Muscle weakness (generalized): Secondary | ICD-10-CM | POA: Diagnosis not present

## 2016-07-11 DIAGNOSIS — J441 Chronic obstructive pulmonary disease with (acute) exacerbation: Secondary | ICD-10-CM | POA: Diagnosis not present

## 2016-07-11 DIAGNOSIS — M1990 Unspecified osteoarthritis, unspecified site: Secondary | ICD-10-CM | POA: Diagnosis not present

## 2016-07-12 DIAGNOSIS — M6281 Muscle weakness (generalized): Secondary | ICD-10-CM | POA: Diagnosis not present

## 2016-07-12 DIAGNOSIS — F329 Major depressive disorder, single episode, unspecified: Secondary | ICD-10-CM | POA: Diagnosis not present

## 2016-07-12 DIAGNOSIS — J441 Chronic obstructive pulmonary disease with (acute) exacerbation: Secondary | ICD-10-CM | POA: Diagnosis not present

## 2016-07-12 DIAGNOSIS — N184 Chronic kidney disease, stage 4 (severe): Secondary | ICD-10-CM | POA: Diagnosis not present

## 2016-07-12 DIAGNOSIS — M1990 Unspecified osteoarthritis, unspecified site: Secondary | ICD-10-CM | POA: Diagnosis not present

## 2016-07-12 DIAGNOSIS — I129 Hypertensive chronic kidney disease with stage 1 through stage 4 chronic kidney disease, or unspecified chronic kidney disease: Secondary | ICD-10-CM | POA: Diagnosis not present

## 2016-07-13 DIAGNOSIS — M1990 Unspecified osteoarthritis, unspecified site: Secondary | ICD-10-CM | POA: Diagnosis not present

## 2016-07-13 DIAGNOSIS — I129 Hypertensive chronic kidney disease with stage 1 through stage 4 chronic kidney disease, or unspecified chronic kidney disease: Secondary | ICD-10-CM | POA: Diagnosis not present

## 2016-07-13 DIAGNOSIS — F329 Major depressive disorder, single episode, unspecified: Secondary | ICD-10-CM | POA: Diagnosis not present

## 2016-07-13 DIAGNOSIS — J441 Chronic obstructive pulmonary disease with (acute) exacerbation: Secondary | ICD-10-CM | POA: Diagnosis not present

## 2016-07-13 DIAGNOSIS — M6281 Muscle weakness (generalized): Secondary | ICD-10-CM | POA: Diagnosis not present

## 2016-07-13 DIAGNOSIS — N184 Chronic kidney disease, stage 4 (severe): Secondary | ICD-10-CM | POA: Diagnosis not present

## 2016-07-17 ENCOUNTER — Other Ambulatory Visit: Payer: Self-pay | Admitting: Internal Medicine

## 2016-07-18 ENCOUNTER — Other Ambulatory Visit: Payer: Self-pay | Admitting: Internal Medicine

## 2016-07-18 DIAGNOSIS — F329 Major depressive disorder, single episode, unspecified: Secondary | ICD-10-CM | POA: Diagnosis not present

## 2016-07-18 DIAGNOSIS — M1990 Unspecified osteoarthritis, unspecified site: Secondary | ICD-10-CM | POA: Diagnosis not present

## 2016-07-18 DIAGNOSIS — J441 Chronic obstructive pulmonary disease with (acute) exacerbation: Secondary | ICD-10-CM | POA: Diagnosis not present

## 2016-07-18 DIAGNOSIS — N184 Chronic kidney disease, stage 4 (severe): Secondary | ICD-10-CM | POA: Diagnosis not present

## 2016-07-18 DIAGNOSIS — I129 Hypertensive chronic kidney disease with stage 1 through stage 4 chronic kidney disease, or unspecified chronic kidney disease: Secondary | ICD-10-CM | POA: Diagnosis not present

## 2016-07-18 DIAGNOSIS — M6281 Muscle weakness (generalized): Secondary | ICD-10-CM | POA: Diagnosis not present

## 2016-07-20 DIAGNOSIS — N184 Chronic kidney disease, stage 4 (severe): Secondary | ICD-10-CM | POA: Diagnosis not present

## 2016-07-20 DIAGNOSIS — I129 Hypertensive chronic kidney disease with stage 1 through stage 4 chronic kidney disease, or unspecified chronic kidney disease: Secondary | ICD-10-CM | POA: Diagnosis not present

## 2016-07-20 DIAGNOSIS — F329 Major depressive disorder, single episode, unspecified: Secondary | ICD-10-CM | POA: Diagnosis not present

## 2016-07-20 DIAGNOSIS — M1990 Unspecified osteoarthritis, unspecified site: Secondary | ICD-10-CM | POA: Diagnosis not present

## 2016-07-20 DIAGNOSIS — M6281 Muscle weakness (generalized): Secondary | ICD-10-CM | POA: Diagnosis not present

## 2016-07-20 DIAGNOSIS — J441 Chronic obstructive pulmonary disease with (acute) exacerbation: Secondary | ICD-10-CM | POA: Diagnosis not present

## 2016-07-21 DIAGNOSIS — M1990 Unspecified osteoarthritis, unspecified site: Secondary | ICD-10-CM | POA: Diagnosis not present

## 2016-07-21 DIAGNOSIS — M6281 Muscle weakness (generalized): Secondary | ICD-10-CM | POA: Diagnosis not present

## 2016-07-21 DIAGNOSIS — J441 Chronic obstructive pulmonary disease with (acute) exacerbation: Secondary | ICD-10-CM | POA: Diagnosis not present

## 2016-07-21 DIAGNOSIS — F329 Major depressive disorder, single episode, unspecified: Secondary | ICD-10-CM | POA: Diagnosis not present

## 2016-07-21 DIAGNOSIS — I129 Hypertensive chronic kidney disease with stage 1 through stage 4 chronic kidney disease, or unspecified chronic kidney disease: Secondary | ICD-10-CM | POA: Diagnosis not present

## 2016-07-21 DIAGNOSIS — N184 Chronic kidney disease, stage 4 (severe): Secondary | ICD-10-CM | POA: Diagnosis not present

## 2016-07-25 DIAGNOSIS — F329 Major depressive disorder, single episode, unspecified: Secondary | ICD-10-CM | POA: Diagnosis not present

## 2016-07-25 DIAGNOSIS — I129 Hypertensive chronic kidney disease with stage 1 through stage 4 chronic kidney disease, or unspecified chronic kidney disease: Secondary | ICD-10-CM | POA: Diagnosis not present

## 2016-07-25 DIAGNOSIS — M1990 Unspecified osteoarthritis, unspecified site: Secondary | ICD-10-CM | POA: Diagnosis not present

## 2016-07-25 DIAGNOSIS — N184 Chronic kidney disease, stage 4 (severe): Secondary | ICD-10-CM | POA: Diagnosis not present

## 2016-07-25 DIAGNOSIS — M6281 Muscle weakness (generalized): Secondary | ICD-10-CM | POA: Diagnosis not present

## 2016-07-25 DIAGNOSIS — J441 Chronic obstructive pulmonary disease with (acute) exacerbation: Secondary | ICD-10-CM | POA: Diagnosis not present

## 2016-08-09 DIAGNOSIS — M1990 Unspecified osteoarthritis, unspecified site: Secondary | ICD-10-CM | POA: Diagnosis not present

## 2016-08-09 DIAGNOSIS — F329 Major depressive disorder, single episode, unspecified: Secondary | ICD-10-CM | POA: Diagnosis not present

## 2016-08-09 DIAGNOSIS — N184 Chronic kidney disease, stage 4 (severe): Secondary | ICD-10-CM | POA: Diagnosis not present

## 2016-08-09 DIAGNOSIS — J441 Chronic obstructive pulmonary disease with (acute) exacerbation: Secondary | ICD-10-CM | POA: Diagnosis not present

## 2016-08-09 DIAGNOSIS — M6281 Muscle weakness (generalized): Secondary | ICD-10-CM | POA: Diagnosis not present

## 2016-08-09 DIAGNOSIS — I129 Hypertensive chronic kidney disease with stage 1 through stage 4 chronic kidney disease, or unspecified chronic kidney disease: Secondary | ICD-10-CM | POA: Diagnosis not present

## 2016-09-07 DIAGNOSIS — M1711 Unilateral primary osteoarthritis, right knee: Secondary | ICD-10-CM | POA: Diagnosis not present

## 2016-09-07 DIAGNOSIS — E039 Hypothyroidism, unspecified: Secondary | ICD-10-CM | POA: Diagnosis not present

## 2016-09-11 ENCOUNTER — Other Ambulatory Visit: Payer: Self-pay | Admitting: Internal Medicine

## 2016-09-12 ENCOUNTER — Other Ambulatory Visit: Payer: Self-pay | Admitting: Internal Medicine

## 2016-11-08 DIAGNOSIS — M1711 Unilateral primary osteoarthritis, right knee: Secondary | ICD-10-CM | POA: Diagnosis not present

## 2017-01-25 ENCOUNTER — Other Ambulatory Visit: Payer: Medicare Other

## 2017-02-06 ENCOUNTER — Encounter: Payer: Self-pay | Admitting: Genetic Counselor

## 2017-02-06 ENCOUNTER — Telehealth: Payer: Self-pay | Admitting: Genetic Counselor

## 2017-02-06 DIAGNOSIS — Z1379 Encounter for other screening for genetic and chromosomal anomalies: Secondary | ICD-10-CM | POA: Insufficient documentation

## 2017-02-06 NOTE — Telephone Encounter (Signed)
Mailbox is full and I cannot leave a message.

## 2017-02-13 ENCOUNTER — Telehealth: Payer: Self-pay | Admitting: Genetic Counselor

## 2017-02-13 NOTE — Telephone Encounter (Signed)
VM is full and I cannot leave a message.

## 2017-02-21 ENCOUNTER — Telehealth: Payer: Self-pay | Admitting: Genetic Counselor

## 2017-02-21 ENCOUNTER — Encounter: Payer: Self-pay | Admitting: Genetic Counselor

## 2017-02-21 NOTE — Telephone Encounter (Signed)
Negative genetic testing.  Patient tested for the BRCA2 VUS identified in her daughter.  Discussed with the daughter Peter Congo), that we need to talk with her cousin on her father's side of the family.  We discussed that we could offer familial testing, but also would recommend testing through [facility] if she would want to be followed as if she is high risk.  Patient will reach out to her cousin, and we have permission to discuss this further with her cousin June.

## 2017-02-21 NOTE — Progress Notes (Addendum)
HPI: [name] was previously seen in the [facility] due to a family history of cancer and concerns regarding a hereditary predisposition to cancer, and genetic testing through [facility]'s family variant testing program. [name] recent genetic test results were disclosed to her, as were recommendations warranted by these results. These results and recommendations are discussed in more detail below.  CANCER HISTORY:   No history exists.    FAMILY HISTORY:  We obtained a detailed, 4-generation family history.  Significant diagnoses are listed below: Family History  Problem Relation Age of Onset  . Hypertension Other     The patient has two daughters and a son.  One daughter was diagnosed with triple negative breast cancer at age 18 and underwent genetic testing that found a BRCA2 VUS.  The complicating factor is that there is a discrepancy in the classification of this VUS between the laboratory who did the testing and another large lab, with the large lab calling it pathogenic.  Therefore, family testing was requested.  The patient has seven sisters and four brothers.  Two sisters died as infants, one sister was diagnosed with breast cancer at 29 and died at 75, and a second sister was diagnosed with breast cancer at 7 and is still alive.  The patient's mother was diagnosed with breast cancer at 27 and died in her 73's  Two sisters had an unknown cancer, with one possibly having a "female" cancer. . Patient's maternal ancestors are of Caucasian descent, and paternal ancestors are of Caucasian descent. There is no reported Ashkenazi Jewish ancestry. There is no known consanguinity.  GENETIC TEST RESULTS:  We recommended [name] pursue testing for of the BRCA2 gene variant through the variant testing program. 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 [name] test was normal and did not reveal the familial mutation. We call this result a true  negative result because the cancer-causing mutation was identified in [name]'s family, and she did not inherit it.        ADDITIONAL GENETIC TESTING: We discussed with [name] that there are other genes that are associated with increased cancer risk that can be analyzed. The laboratories that offer such testing look at these additional genes via a hereditary cancer gene panel. Should [name] wish to pursue additional genetic testing, we are happy to discuss and coordinate this testing, at any time.    CANCER SCREENING RECOMMENDATIONS: Given [name]'s personal and family histories, we must interpret these negative results with some caution.  Families with features suggestive of hereditary risk for cancer tend to have multiple family members with cancer, diagnoses in multiple generations and diagnoses before the age of 71. [name] family exhibits some of these features. Thus this result may simply reflect our current inability to detect all mutations within these genes or there may be a different gene that has not yet been discovered or tested.   RECOMMENDATIONS FOR FAMILY MEMBERS: Women in this family might be at some increased risk of developing cancer, over the general population risk, simply due to the family history of cancer. We recommended women in this family have a yearly mammogram beginning at age 76, or 32 years younger than the earliest onset of cancer, an annual clinical breast exam, and perform monthly breast self-exams. Women in this family should also have a gynecological exam as recommended by their primary provider. All family members should have a colonoscopy by age 51.  Based on [name]'s  family history, we recommended her sister, Cheryl Lane, who was diagnosed with breast cancer at age 44, have genetic counseling and testing. [name] will let us know if we can be of any assistance in coordinating genetic counseling and/or testing for this family member.   FOLLOW-UP:  Lastly, we discussed with [name] that cancer genetics is a rapidly advancing field and it is possible that new genetic tests will be appropriate for her and/or her family members in the future. We encouraged her to remain in contact with cancer genetics on an annual basis so we can update her personal and family histories and let her know of advances in cancer genetics that may benefit this family.   Our contact number was provided. [name] questions were answered to her satisfaction, and she knows she is welcome to call us at anytime with additional questions or concerns.   Roma Kayser, MS, [facility] Certified Genetic Counselor [email or web address]

## 2017-02-24 ENCOUNTER — Emergency Department (HOSPITAL_COMMUNITY): Payer: Medicare Other

## 2017-02-24 ENCOUNTER — Encounter (HOSPITAL_COMMUNITY): Payer: Self-pay

## 2017-02-24 ENCOUNTER — Emergency Department (HOSPITAL_COMMUNITY)
Admission: EM | Admit: 2017-02-24 | Discharge: 2017-02-24 | Disposition: A | Discharge status: Home / Self Care | Payer: Medicare Other | Attending: Emergency Medicine | Admitting: Emergency Medicine

## 2017-02-24 DIAGNOSIS — M25562 Pain in left knee: Secondary | ICD-10-CM | POA: Diagnosis not present

## 2017-02-24 DIAGNOSIS — S42202A Unspecified fracture of upper end of left humerus, initial encounter for closed fracture: Secondary | ICD-10-CM | POA: Diagnosis not present

## 2017-02-24 DIAGNOSIS — W01198A Fall on same level from slipping, tripping and stumbling with subsequent striking against other object, initial encounter: Secondary | ICD-10-CM | POA: Diagnosis not present

## 2017-02-24 DIAGNOSIS — W19XXXA Unspecified fall, initial encounter: Secondary | ICD-10-CM

## 2017-02-24 DIAGNOSIS — S42292A Other displaced fracture of upper end of left humerus, initial encounter for closed fracture: Secondary | ICD-10-CM | POA: Diagnosis not present

## 2017-02-24 DIAGNOSIS — S59912A Unspecified injury of left forearm, initial encounter: Secondary | ICD-10-CM | POA: Diagnosis present

## 2017-02-24 DIAGNOSIS — Y9389 Activity, other specified: Secondary | ICD-10-CM | POA: Diagnosis not present

## 2017-02-24 DIAGNOSIS — I130 Hypertensive heart and chronic kidney disease with heart failure and stage 1 through stage 4 chronic kidney disease, or unspecified chronic kidney disease: Secondary | ICD-10-CM | POA: Diagnosis not present

## 2017-02-24 DIAGNOSIS — Z79899 Other long term (current) drug therapy: Secondary | ICD-10-CM | POA: Diagnosis not present

## 2017-02-24 DIAGNOSIS — S42302A Unspecified fracture of shaft of humerus, left arm, initial encounter for closed fracture: Secondary | ICD-10-CM | POA: Diagnosis not present

## 2017-02-24 DIAGNOSIS — R52 Pain, unspecified: Secondary | ICD-10-CM

## 2017-02-24 DIAGNOSIS — I509 Heart failure, unspecified: Secondary | ICD-10-CM | POA: Insufficient documentation

## 2017-02-24 DIAGNOSIS — T148XXA Other injury of unspecified body region, initial encounter: Secondary | ICD-10-CM | POA: Diagnosis not present

## 2017-02-24 DIAGNOSIS — Y929 Unspecified place or not applicable: Secondary | ICD-10-CM | POA: Insufficient documentation

## 2017-02-24 DIAGNOSIS — J45909 Unspecified asthma, uncomplicated: Secondary | ICD-10-CM | POA: Insufficient documentation

## 2017-02-24 DIAGNOSIS — M25512 Pain in left shoulder: Secondary | ICD-10-CM | POA: Insufficient documentation

## 2017-02-24 DIAGNOSIS — N183 Chronic kidney disease, stage 3 (moderate): Secondary | ICD-10-CM | POA: Diagnosis not present

## 2017-02-24 DIAGNOSIS — Y999 Unspecified external cause status: Secondary | ICD-10-CM | POA: Diagnosis not present

## 2017-02-24 DIAGNOSIS — S8002XA Contusion of left knee, initial encounter: Secondary | ICD-10-CM

## 2017-02-24 DIAGNOSIS — S42309A Unspecified fracture of shaft of humerus, unspecified arm, initial encounter for closed fracture: Secondary | ICD-10-CM

## 2017-02-24 DIAGNOSIS — M542 Cervicalgia: Secondary | ICD-10-CM | POA: Diagnosis not present

## 2017-02-24 HISTORY — DX: Unspecified fracture of shaft of humerus, unspecified arm, initial encounter for closed fracture: S42.309A

## 2017-02-24 MED ORDER — ONDANSETRON HCL 4 MG/2ML IJ SOLN
4.0000 mg | Freq: Once | INTRAMUSCULAR | Status: AC
  Administered 2017-02-24: 4 mg via INTRAVENOUS
  Filled 2017-02-24: qty 2

## 2017-02-24 MED ORDER — HYDROMORPHONE HCL 1 MG/ML IJ SOLN
1.0000 mg | Freq: Once | INTRAMUSCULAR | Status: AC
  Administered 2017-02-24: 1 mg via INTRAVENOUS
  Filled 2017-02-24: qty 1

## 2017-02-24 MED ORDER — OXYCODONE-ACETAMINOPHEN 5-325 MG PO TABS: 2.0000 | ORAL_TABLET | ORAL | 0 refills | Status: DC | PRN

## 2017-02-24 NOTE — ED Provider Notes (Signed)
[place] EMERGENCY DEPARTMENT Provider Note   CSN: [id number] Arrival date & time: 02/24/17  1743     History   Chief Complaint Chief Complaint  Patient presents with  . Fall  . Arm Injury    HPI Cheryl Lane is a 66 y.o. female.  The history is provided by the patient. No language interpreter was used.  Fall  This is a Cheryl problem. The problem occurs constantly. The problem has been gradually worsening. Pertinent negatives include no chest pain and no abdominal pain. Nothing aggravates the symptoms. Nothing relieves the symptoms. She has tried nothing for the symptoms. The treatment provided no relief.  Arm Injury    Patient reports she was walking out of the beauty salon and slipped and fell after getting her hair done.  Patient complains of pain in her left shoulder at the left side of her neck and her left knee.  Patient reports she did not hit her head she did not lose consciousness.  Patient complains of severe pain in her left shoulder.  Patient reports as she has been sitting she is starting to have some pain on the left side of her neck left side of her neck hurts when she moves her neck.  She is also concerned about her left knee.  Patient reports only slight pain in her left knee but she has arthritis in that knee and is concerned about injury.  She states she has been told that she has bone-on-bone arthritis.  Patient complains of pain with moving her left arm from the elbow up.  Patient denies any other areas of injury as she denies any pain in her low back.  Patient denies chest or abdominal pain  Past Medical History:  Diagnosis Date  . Arthritis   . Asthma   . Cervical radiculopathy   . Chronic pain in left shoulder   . CKD (chronic kidney disease)   . Depression   . Hypertension     Patient Active Problem List   Diagnosis Date Noted  . Genetic testing 02/06/2017  . Acute respiratory failure with hypoxia ([place]) 06/07/2016  . RSV (acute  bronchiolitis due to respiratory syncytial virus) 06/07/2016  . Anxiety 06/07/2016  . Arthritis   . Depression 05/30/2016  . Chronic kidney disease (CKD), stage IV (severe) ([place]) 05/30/2016  . Mild intermittent asthma with acute exacerbation   . Respiratory distress   . CKD (chronic kidney disease) stage 3, GFR 30-59 ml/min ([facility])   . Chronic pain syndrome   . Bronchitis 05/29/2016  . Left shoulder pain   . Opacity of lung on imaging study   . Shoulder pain, acute   . Weakness generalized 12/22/2015  . Essential hypertension 12/22/2015  . Pneumonia 12/22/2015  . PVC's (premature ventricular contractions) 12/22/2015  . CAP (community acquired pneumonia) 12/21/2015    Past Surgical History:  Procedure Laterality Date  . APPENDECTOMY      OB History    No data available       Home Medications    Prior to Admission medications   Medication Sig Start Date End Date Taking? Authorizing Provider  albuterol (PROVENTIL HFA;VENTOLIN HFA) 108 (90 Base) MCG/ACT inhaler Inhale 2 puffs into the lungs every 4 (four) hours as needed for wheezing or shortness of breath. 06/02/16   Ghimire, Henreitta Leber, MD  albuterol (PROVENTIL) (2.5 MG/3ML) 0.083% nebulizer solution Take 3 mLs (2.5 mg total) by nebulization every 4 (four) hours as needed for wheezing or shortness of  breath. 06/02/16   Ghimire, Henreitta Leber, MD  ALPRAZolam Duanne Moron) 0.25 MG tablet Take 1 tablet (0.25 mg total) by mouth 3 (three) times daily as needed for anxiety. 06/02/16   Ghimire, Henreitta Leber, MD  amLODipine (NORVASC) 10 MG tablet Take 1 tablet (10 mg total) by mouth daily. 06/04/16   Ghimire, Henreitta Leber, MD  benzonatate (TESSALON) 200 MG capsule Take 1 capsule (200 mg total) by mouth 3 (three) times daily as needed for cough. 06/02/16   Ghimire, Henreitta Leber, MD  clonazePAM (KLONOPIN) 0.5 MG tablet Take 0.5 mg by mouth at bedtime.    [provider]  fluticasone (FLONASE) 50 MCG/ACT nasal spray Place 2 sprays into both nostrils daily.  06/03/16   Ghimire, Henreitta Leber, MD  guaiFENesin (MUCINEX) 600 MG 12 hr tablet Take 1 tablet (600 mg total) by mouth 2 (two) times daily. 06/02/16   Ghimire, Henreitta Leber, MD  ipratropium-albuterol (DUONEB) 0.5-2.5 (3) MG/3ML SOLN Take 3 mLs by nebulization every 8 (eight) hours. 06/02/16   Ghimire, Henreitta Leber, MD  lisinopril (PRINIVIL,ZESTRIL) 10 MG tablet Take 2 tablets (20 mg total) by mouth daily. 06/03/16   Ghimire, Henreitta Leber, MD  loratadine (CLARITIN) 10 MG tablet Take 1 tablet (10 mg total) by mouth daily. 06/03/16   Ghimire, Henreitta Leber, MD  traMADol (ULTRAM) 50 MG tablet Take 1 tablet (50 mg total) by mouth every 6 (six) hours as needed for moderate pain. 06/02/16   Ghimire, Henreitta Leber, MD    Family History Family History  Problem Relation Age of Onset  . Hypertension Other   . Breast cancer Mother 15  . Breast cancer Sister 71  . Lung cancer Brother   . Cancer Sister 44       "female" cancer  . Cancer Sister        unknown cancer  . Breast cancer Sister 27    Social History Social History  Substance Use Topics  . Smoking status: Never Smoker  . Smokeless tobacco: Never Used  . Alcohol use No     Allergies   Patient has no known allergies.   Review of Systems Review of Systems  Cardiovascular: Negative for chest pain.  Gastrointestinal: Negative for abdominal pain.  All other systems reviewed and are negative.    Physical Exam Updated Vital Signs There were no vitals taken for this visit.  Physical Exam  Constitutional: She appears well-developed and well-nourished.  HENT:  Head: Normocephalic.  Right Ear: External ear normal.  Left Ear: External ear normal.  Nose: Nose normal.  Mouth/Throat: Oropharynx is clear and moist.  Eyes: Pupils are equal, round, and reactive to light.  Neck: Normal range of motion.  Cardiovascular: Normal rate.   Pulmonary/Chest: Effort normal.  Abdominal: Soft.  Neurological: She is alert.  Skin: Skin is warm.  Psychiatric: She has a  normal mood and affect.  Nursing note and vitals reviewed.    ED Treatments / Results  Labs (all labs ordered are listed, but only abnormal results are displayed) Labs Reviewed - No data to display  EKG  EKG Interpretation None       Radiology Dg Cervical Spine Complete  Result Date: 02/24/2017 CLINICAL DATA:  Pt fell outside while at the salon today. Pt states she lost her balance. Neck pain. EXAM: CERVICAL SPINE - COMPLETE 4+ VIEW COMPARISON:  None. FINDINGS: No fracture or spondylolisthesis. Mild loss of disc height at C5-C6 and C6-C7 with endplate spurring. Mild neural foraminal narrowing from uncovertebral spurring at these  levels. Soft tissues are unremarkable. IMPRESSION: No fracture, spondylolisthesis or acute finding. Electronically Signed   By: Lajean Manes M.D.   On: 02/24/2017 19:42   Dg Shoulder Left  Result Date: 02/24/2017 CLINICAL DATA:  Pt fell outside while at the salon today. Pt states she lost her balance. Left shoulder pain. EXAM: LEFT SHOULDER - 2+ VIEW COMPARISON:  None. FINDINGS: Fracture across the proximal humeral metaphysis which is not well-defined. The shaft component has migrated superiorly in relation to the humeral head component. No other fractures.  No dislocation. There is significant narrowing of the glenohumeral joint with prominent marginal osteophytes from base of the humeral head. AC joint is normally spaced and aligned. Bones are diffusely demineralized. IMPRESSION: 1. Although the fracture line is not defined, the configuration of the proximal humerus is consistent with a fracture of the metaphysis, with shaft impacting against the humeral head component. 2. No dislocation. 3. Advanced glenohumeral joint arthropathic changes. Electronically Signed   By: Lajean Manes M.D.   On: 02/24/2017 19:45   Dg Knee Complete 4 Views Left  Result Date: 02/24/2017 CLINICAL DATA:  Pt fell outside while at the salon today. Pt states she lost her balance. Left  knee pain. EXAM: LEFT KNEE - COMPLETE 4+ VIEW COMPARISON:  None. FINDINGS: No fracture.  No bone lesion. There is lateral and patellofemoral joint space compartment narrowing associated with marginal osteophytes. Subtle chondrocalcinosis noted along the menisci. Bones are demineralized. No convincing joint effusion. Surrounding soft tissues are unremarkable. IMPRESSION: 1. No fracture, dislocation or acute finding. 2. Arthropathic changes of the left knee.  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 Electronically Signed   By: Lajean Manes M.D.   On: 02/24/2017 19:41   Dg Humerus Left  Result Date: 02/24/2017 CLINICAL DATA:  Pt fell outside while at the salon today. Pt states she lost her balance EXAM: LEFT HUMERUS - 2+ VIEW COMPARISON:  None. FINDINGS: Evidence of a fracture of the proximal humeral metaphysis as defined on the left shoulder radiographs. Fracture line is better seen on the humeral radiographs than the shoulder radiographs. No other fractures.  No dislocation.  The bones are demineralized. IMPRESSION: Fracture across the proximal metaphysis of the left humerus with the shaft impacting against the humeral head component. No other fractures. No dislocation. Electronically Signed   By: Lajean Manes M.D.   On: 02/24/2017 19:46    Procedures Procedures (including critical care time)  Medications Ordered in ED Medications  HYDROmorphone (DILAUDID) injection 1 mg (not administered)  ondansetron (ZOFRAN) injection 4 mg (not administered)     Initial Impression / Assessment and Plan / ED Course  I have reviewed the triage vital signs and the nursing notes.  Pertinent labs & imaging results that were available during my care of the patient were reviewed by me and considered in my medical decision making (see chart for details).     X-ray shows fracture of humerus.  Patient placed in shoulder immobilizer.  Patient is advised to follow-up with Dr. Ninfa Linden with pedis tone call or her orthopedist of  choice patient is given a prescription for Percocet for pain.  She is to return to the emergency department if any problems.  Final Clinical Impressions(s) / ED Diagnoses   Final diagnoses:  Humerus head fracture, left, closed, initial encounter  Fall, initial encounter  Contusion of left knee, initial encounter    Cheryl Prescriptions Cheryl Prescriptions   OXYCODONE-ACETAMINOPHEN (PERCOCET/ROXICET) 5-325 MG TABLET    Take 2 tablets by mouth every 4 (  four) hours as needed for severe pain.   An After Visit Summary was printed and given to the patient.    Fransico Meadow, PA-C 02/24/17 Tyson Alias, MD 02/25/17 [phone]

## 2017-02-24 NOTE — ED Triage Notes (Signed)
Pt fell outside while at the salon today. Pt states she lost her balance.

## 2017-02-24 NOTE — ED Notes (Signed)
Ortho paged. 

## 2017-02-24 NOTE — Progress Notes (Signed)
Orthopedic Tech Progress Note Patient Details:  Cheryl Lane 05-14-1950 [id number]  Ortho Devices Type of Ortho Device: Other (comment) Ortho Device/Splint Location: lue sling and swathe Ortho Device/Splint Interventions: Ordered, Application, Adjustment   Karolee Stamps 02/24/2017, 8:33 PM

## 2017-02-24 NOTE — ED Notes (Signed)
Pt stable, ambulatory, states understanding of discharge instructions 

## 2017-02-26 DIAGNOSIS — S42292A Other displaced fracture of upper end of left humerus, initial encounter for closed fracture: Secondary | ICD-10-CM | POA: Diagnosis not present

## 2017-02-26 DIAGNOSIS — M19012 Primary osteoarthritis, left shoulder: Secondary | ICD-10-CM | POA: Diagnosis not present

## 2017-02-28 ENCOUNTER — Encounter (HOSPITAL_COMMUNITY): Payer: Self-pay | Admitting: *Deleted

## 2017-02-28 MED ORDER — DEXTROSE 5 % IV SOLN
3.0000 g | INTRAVENOUS | Status: AC
  Administered 2017-03-01: 3 g via INTRAVENOUS
  Filled 2017-02-28 (×2): qty 3000

## 2017-02-28 NOTE — Progress Notes (Signed)
Spoke with pt for pre-op call. Pt denies cardiac history, chest pain or sob. Pt states she is pre-diabetic. Does not know what her last A1C was or when it was.

## 2017-02-28 NOTE — Progress Notes (Signed)
Anesthesia Chart Review:  Pt is a same day work up  Pt is a 66 year old female scheduled for L reverse shoulder arthroplasty on 03/01/2017 with Justice Britain, MD  - PCP is Nelda Bucks, MD  PMH includes:  HTN, CKD (stage 2-3), asthma, hypothyroidism, remote hx anemia. Never smoker.   - Hospitalized 2/5-9/18 for acute hypoxic respiratory failure due to bronchitis from RSV  Medications include: levothyroxine, lisinopril  Labs will be obtained day of surgery  CXR 05/31/16: Chronic interstitial prominence, stable. No acute pneumonia nor CHF.  EKG 05/29/16: Sinus tachycardia (103 bpm). LAFB. Low voltage, precordial leads. LVH. Anterior Q waves, possibly due to LVH. Baseline wander in lead(s) V6  Echo 12/23/15:  - Left ventricle: The cavity size was normal. There was mild concentric hypertrophy. Systolic function was normal. The estimated ejection fraction was in the range of 60% to 65%. Doppler parameters are consistent with abnormal left ventricular relaxation (grade 1 diastolic dysfunction). There was no evidence of elevated ventricular filling pressure by Doppler parameters. - Aortic valve: Structurally normal valve. There was no regurgitation. - Mitral valve: Structurally normal valve. There was no regurgitation. - Left atrium: The atrium was mildly dilated. - Right ventricle: The cavity size was normal. Wall thickness was normal. Systolic function was normal. - Right atrium: The atrium was normal in size. - Tricuspid valve: There was trivial regurgitation. - Pulmonary arteries: Systolic pressure was within the normal range. - Pericardium, extracardiac: The pericardium was normal in appearance.  If labs acceptable day of surgery, I anticipate pt can proceed as scheduled.   Willeen Cass, FNP-BC [facility] Short Stay Surgical Center/Anesthesiology Phone: [phone] 02/28/2017 12:41 PM

## 2017-03-01 ENCOUNTER — Encounter (HOSPITAL_COMMUNITY)
Admission: RE | Disposition: A | Discharge status: Skilled Nursing Facility | Payer: Self-pay | Source: Ambulatory Visit | Attending: Orthopedic Surgery

## 2017-03-01 ENCOUNTER — Encounter (HOSPITAL_COMMUNITY): Payer: Self-pay | Admitting: *Deleted

## 2017-03-01 ENCOUNTER — Inpatient Hospital Stay (HOSPITAL_COMMUNITY)
Admission: RE | Admit: 2017-03-01 | Discharge: 2017-03-05 | DRG: 483 | Disposition: A | Discharge status: Skilled Nursing Facility | Payer: Medicare Other | Source: Ambulatory Visit | Attending: Orthopedic Surgery | Admitting: Orthopedic Surgery

## 2017-03-01 ENCOUNTER — Inpatient Hospital Stay (HOSPITAL_COMMUNITY): Payer: Medicare Other | Admitting: Certified Registered Nurse Anesthetist

## 2017-03-01 ENCOUNTER — Other Ambulatory Visit: Payer: Self-pay

## 2017-03-01 DIAGNOSIS — M1990 Unspecified osteoarthritis, unspecified site: Secondary | ICD-10-CM | POA: Diagnosis not present

## 2017-03-01 DIAGNOSIS — Z96612 Presence of left artificial shoulder joint: Secondary | ICD-10-CM

## 2017-03-01 DIAGNOSIS — N184 Chronic kidney disease, stage 4 (severe): Secondary | ICD-10-CM | POA: Diagnosis not present

## 2017-03-01 DIAGNOSIS — Z23 Encounter for immunization: Secondary | ICD-10-CM

## 2017-03-01 DIAGNOSIS — S42292A Other displaced fracture of upper end of left humerus, initial encounter for closed fracture: Secondary | ICD-10-CM | POA: Diagnosis not present

## 2017-03-01 DIAGNOSIS — N182 Chronic kidney disease, stage 2 (mild): Secondary | ICD-10-CM | POA: Diagnosis present

## 2017-03-01 DIAGNOSIS — Z791 Long term (current) use of non-steroidal anti-inflammatories (NSAID): Secondary | ICD-10-CM

## 2017-03-01 DIAGNOSIS — R41841 Cognitive communication deficit: Secondary | ICD-10-CM | POA: Diagnosis not present

## 2017-03-01 DIAGNOSIS — S42202A Unspecified fracture of upper end of left humerus, initial encounter for closed fracture: Principal | ICD-10-CM | POA: Diagnosis present

## 2017-03-01 DIAGNOSIS — F329 Major depressive disorder, single episode, unspecified: Secondary | ICD-10-CM | POA: Diagnosis present

## 2017-03-01 DIAGNOSIS — Z79890 Hormone replacement therapy (postmenopausal): Secondary | ICD-10-CM

## 2017-03-01 DIAGNOSIS — S6980XA Other specified injuries of unspecified wrist, hand and finger(s), initial encounter: Secondary | ICD-10-CM | POA: Diagnosis not present

## 2017-03-01 DIAGNOSIS — J45909 Unspecified asthma, uncomplicated: Secondary | ICD-10-CM | POA: Diagnosis present

## 2017-03-01 DIAGNOSIS — M5412 Radiculopathy, cervical region: Secondary | ICD-10-CM | POA: Diagnosis present

## 2017-03-01 DIAGNOSIS — M19019 Primary osteoarthritis, unspecified shoulder: Secondary | ICD-10-CM | POA: Diagnosis not present

## 2017-03-01 DIAGNOSIS — I129 Hypertensive chronic kidney disease with stage 1 through stage 4 chronic kidney disease, or unspecified chronic kidney disease: Secondary | ICD-10-CM | POA: Diagnosis present

## 2017-03-01 DIAGNOSIS — W19XXXA Unspecified fall, initial encounter: Secondary | ICD-10-CM | POA: Diagnosis present

## 2017-03-01 DIAGNOSIS — M6281 Muscle weakness (generalized): Secondary | ICD-10-CM | POA: Diagnosis not present

## 2017-03-01 DIAGNOSIS — G8911 Acute pain due to trauma: Secondary | ICD-10-CM | POA: Diagnosis not present

## 2017-03-01 DIAGNOSIS — E039 Hypothyroidism, unspecified: Secondary | ICD-10-CM | POA: Diagnosis present

## 2017-03-01 DIAGNOSIS — R262 Difficulty in walking, not elsewhere classified: Secondary | ICD-10-CM | POA: Diagnosis not present

## 2017-03-01 DIAGNOSIS — D649 Anemia, unspecified: Secondary | ICD-10-CM | POA: Diagnosis not present

## 2017-03-01 DIAGNOSIS — Z9181 History of falling: Secondary | ICD-10-CM | POA: Diagnosis not present

## 2017-03-01 DIAGNOSIS — M19012 Primary osteoarthritis, left shoulder: Secondary | ICD-10-CM | POA: Diagnosis not present

## 2017-03-01 DIAGNOSIS — S42292D Other displaced fracture of upper end of left humerus, subsequent encounter for fracture with routine healing: Secondary | ICD-10-CM | POA: Diagnosis not present

## 2017-03-01 HISTORY — DX: Pneumonia, unspecified organism: J18.9

## 2017-03-01 HISTORY — DX: Prediabetes: R73.03

## 2017-03-01 HISTORY — DX: Anemia, unspecified: D64.9

## 2017-03-01 HISTORY — PX: REVERSE SHOULDER ARTHROPLASTY: SHX5054

## 2017-03-01 HISTORY — DX: Presence of left artificial shoulder joint: Z96.612

## 2017-03-01 HISTORY — DX: Hypothyroidism, unspecified: E03.9

## 2017-03-01 LAB — CBC
HCT: 39.5 % (ref 36.0–46.0)
Hemoglobin: 12.5 g/dL (ref 12.0–15.0)
MCH: 28.5 pg (ref 26.0–34.0)
MCHC: 31.6 g/dL (ref 30.0–36.0)
MCV: 90.2 fL (ref 78.0–100.0)
PLATELETS: 280 10*3/uL (ref 150–400)
RBC: 4.38 MIL/uL (ref 3.87–5.11)
RDW: 15.2 % (ref 11.5–15.5)
WBC: 4.5 10*3/uL (ref 4.0–10.5)

## 2017-03-01 LAB — BASIC METABOLIC PANEL
Anion gap: 9 (ref 5–15)
BUN: 15 mg/dL (ref 6–20)
CALCIUM: 9.1 mg/dL (ref 8.9–10.3)
CO2: 25 mmol/L (ref 22–32)
CREATININE: 1.08 mg/dL — AB (ref 0.44–1.00)
Chloride: 103 mmol/L (ref 101–111)
GFR calc Af Amer: 60 mL/min — ABNORMAL LOW (ref >60)
GFR calc non Af Amer: 52 mL/min — ABNORMAL LOW (ref >60)
GLUCOSE: 129 mg/dL — AB (ref 65–99)
Potassium: 3.9 mmol/L (ref 3.5–5.1)
Sodium: 137 mmol/L (ref 135–145)

## 2017-03-01 LAB — GLUCOSE, CAPILLARY: GLUCOSE-CAPILLARY: 128 mg/dL — AB (ref 65–99)

## 2017-03-01 SURGERY — ARTHROPLASTY, SHOULDER, TOTAL, REVERSE
Anesthesia: General | Site: Shoulder | Laterality: Left

## 2017-03-01 MED ORDER — ACETAMINOPHEN 650 MG RE SUPP: 650.0000 mg | RECTAL | Status: DC | PRN

## 2017-03-01 MED ORDER — ROCURONIUM BROMIDE 100 MG/10ML IV SOLN
INTRAVENOUS | Status: DC | PRN
  Administered 2017-03-01: 60 mg via INTRAVENOUS

## 2017-03-01 MED ORDER — PHENOL 1.4 % MT LIQD
1.0000 | OROMUCOSAL | Status: DC | PRN
Start: 2017-03-01 — End: 2017-03-05

## 2017-03-01 MED ORDER — MIDAZOLAM HCL 2 MG/2ML IJ SOLN
2.0000 mg | Freq: Once | INTRAMUSCULAR | Status: AC
Start: 2017-03-01 — End: 2017-03-01
  Administered 2017-03-01: 2 mg via INTRAVENOUS

## 2017-03-01 MED ORDER — LACTATED RINGERS IV SOLN
INTRAVENOUS | Status: DC
  Administered 2017-03-01: 22:00:00 via INTRAVENOUS

## 2017-03-01 MED ORDER — DEXAMETHASONE SODIUM PHOSPHATE 10 MG/ML IJ SOLN
INTRAMUSCULAR | Status: AC
  Filled 2017-03-01: qty 1

## 2017-03-01 MED ORDER — KETOROLAC TROMETHAMINE 15 MG/ML IJ SOLN
7.5000 mg | Freq: Four times a day (QID) | INTRAMUSCULAR | Status: AC
  Administered 2017-03-01 – 2017-03-02 (×4): 7.5 mg via INTRAVENOUS
  Filled 2017-03-01 (×3): qty 1

## 2017-03-01 MED ORDER — ACETAMINOPHEN 325 MG PO TABS: 650.0000 mg | ORAL_TABLET | ORAL | Status: DC | PRN

## 2017-03-01 MED ORDER — CEFAZOLIN SODIUM-DEXTROSE 2-4 GM/100ML-% IV SOLN
2.0000 g | Freq: Four times a day (QID) | INTRAVENOUS | Status: AC
  Administered 2017-03-01 – 2017-03-02 (×3): 2 g via INTRAVENOUS
  Filled 2017-03-01 (×3): qty 100

## 2017-03-01 MED ORDER — CHLORHEXIDINE GLUCONATE 4 % EX LIQD: 60.0000 mL | Freq: Once | CUTANEOUS | Status: DC

## 2017-03-01 MED ORDER — METOCLOPRAMIDE HCL 5 MG/ML IJ SOLN: 5.0000 mg | Freq: Three times a day (TID) | INTRAMUSCULAR | Status: DC | PRN

## 2017-03-01 MED ORDER — 0.9 % SODIUM CHLORIDE (POUR BTL) OPTIME
TOPICAL | Status: DC | PRN
Start: 2017-03-01 — End: 2017-03-01
  Administered 2017-03-01: 1000 mL

## 2017-03-01 MED ORDER — SCOPOLAMINE 1 MG/3DAYS TD PT72
MEDICATED_PATCH | TRANSDERMAL | Status: AC
  Filled 2017-03-01: qty 1

## 2017-03-01 MED ORDER — BUPIVACAINE-EPINEPHRINE (PF) 0.5% -1:200000 IJ SOLN
INTRAMUSCULAR | Status: DC | PRN
  Administered 2017-03-01: 30 mL via PERINEURAL

## 2017-03-01 MED ORDER — DOCUSATE SODIUM 100 MG PO CAPS
100.0000 mg | ORAL_CAPSULE | Freq: Two times a day (BID) | ORAL | Status: DC
  Administered 2017-03-01 – 2017-03-05 (×8): 100 mg via ORAL
  Filled 2017-03-01 (×8): qty 1

## 2017-03-01 MED ORDER — SUGAMMADEX SODIUM 200 MG/2ML IV SOLN
INTRAVENOUS | Status: DC | PRN
  Administered 2017-03-01: 300 mg via INTRAVENOUS

## 2017-03-01 MED ORDER — SUGAMMADEX SODIUM 500 MG/5ML IV SOLN
INTRAVENOUS | Status: AC
  Filled 2017-03-01: qty 5

## 2017-03-01 MED ORDER — PROPOFOL 10 MG/ML IV BOLUS
INTRAVENOUS | Status: DC | PRN
  Administered 2017-03-01: 20 mg via INTRAVENOUS
  Administered 2017-03-01: 180 mg via INTRAVENOUS

## 2017-03-01 MED ORDER — MAGNESIUM CITRATE PO SOLN: 1.0000 | Freq: Once | ORAL | Status: DC | PRN

## 2017-03-01 MED ORDER — MENTHOL 3 MG MT LOZG: 1.0000 | LOZENGE | OROMUCOSAL | Status: DC | PRN

## 2017-03-01 MED ORDER — LIDOCAINE HCL (CARDIAC) 20 MG/ML IV SOLN
INTRAVENOUS | Status: DC | PRN
  Administered 2017-03-01: 60 mg via INTRAVENOUS

## 2017-03-01 MED ORDER — BISACODYL 5 MG PO TBEC
5.0000 mg | DELAYED_RELEASE_TABLET | Freq: Every day | ORAL | Status: DC | PRN
  Administered 2017-03-05: 5 mg via ORAL
  Filled 2017-03-01: qty 1

## 2017-03-01 MED ORDER — PHENYLEPHRINE HCL 10 MG/ML IJ SOLN
INTRAMUSCULAR | Status: DC | PRN
  Administered 2017-03-01: 120 ug via INTRAVENOUS
  Administered 2017-03-01: 160 ug via INTRAVENOUS

## 2017-03-01 MED ORDER — FENTANYL CITRATE (PF) 100 MCG/2ML IJ SOLN
100.0000 ug | Freq: Once | INTRAMUSCULAR | Status: AC
  Administered 2017-03-01: 100 ug via INTRAVENOUS

## 2017-03-01 MED ORDER — LISINOPRIL 20 MG PO TABS
20.0000 mg | ORAL_TABLET | Freq: Every day | ORAL | Status: DC
  Administered 2017-03-01 – 2017-03-03 (×3): 20 mg via ORAL
  Filled 2017-03-01 (×4): qty 1

## 2017-03-01 MED ORDER — DIAZEPAM 5 MG PO TABS
2.5000 mg | ORAL_TABLET | Freq: Four times a day (QID) | ORAL | Status: DC | PRN
  Administered 2017-03-01 – 2017-03-04 (×3): 5 mg via ORAL
  Filled 2017-03-01 (×2): qty 1

## 2017-03-01 MED ORDER — LACTATED RINGERS IV SOLN
INTRAVENOUS | Status: DC
  Administered 2017-03-01 (×2): via INTRAVENOUS

## 2017-03-01 MED ORDER — ONDANSETRON HCL 4 MG/2ML IJ SOLN: 4.0000 mg | Freq: Four times a day (QID) | INTRAMUSCULAR | Status: DC | PRN

## 2017-03-01 MED ORDER — ESCITALOPRAM OXALATE 10 MG PO TABS
10.0000 mg | ORAL_TABLET | Freq: Every day | ORAL | Status: DC
  Administered 2017-03-01 – 2017-03-05 (×5): 10 mg via ORAL
  Filled 2017-03-01 (×5): qty 1

## 2017-03-01 MED ORDER — OXYCODONE HCL 5 MG PO TABS
10.0000 mg | ORAL_TABLET | ORAL | Status: DC | PRN
  Administered 2017-03-01 – 2017-03-04 (×4): 10 mg via ORAL
  Filled 2017-03-01 (×4): qty 2

## 2017-03-01 MED ORDER — PHENYLEPHRINE HCL 10 MG/ML IJ SOLN
INTRAVENOUS | Status: DC | PRN
  Administered 2017-03-01: 50 ug/min via INTRAVENOUS

## 2017-03-01 MED ORDER — HYDROMORPHONE HCL 1 MG/ML IJ SOLN: 1.0000 mg | INTRAMUSCULAR | Status: DC | PRN

## 2017-03-01 MED ORDER — SCOPOLAMINE 1 MG/3DAYS TD PT72
MEDICATED_PATCH | TRANSDERMAL | Status: DC | PRN
  Administered 2017-03-01: 1 via TRANSDERMAL

## 2017-03-01 MED ORDER — DIAZEPAM 5 MG PO TABS
ORAL_TABLET | ORAL | Status: AC
  Administered 2017-03-01: 19:00:00
  Filled 2017-03-01: qty 1

## 2017-03-01 MED ORDER — METOCLOPRAMIDE HCL 5 MG PO TABS: 5.0000 mg | ORAL_TABLET | Freq: Three times a day (TID) | ORAL | Status: DC | PRN

## 2017-03-01 MED ORDER — ONDANSETRON HCL 4 MG/2ML IJ SOLN
INTRAMUSCULAR | Status: DC | PRN
  Administered 2017-03-01: 4 mg via INTRAVENOUS

## 2017-03-01 MED ORDER — DIPHENHYDRAMINE HCL 12.5 MG/5ML PO ELIX: 12.5000 mg | ORAL_SOLUTION | ORAL | Status: DC | PRN

## 2017-03-01 MED ORDER — POLYETHYLENE GLYCOL 3350 17 G PO PACK: 17.0000 g | PACK | Freq: Every day | ORAL | Status: DC | PRN

## 2017-03-01 MED ORDER — ONDANSETRON HCL 4 MG/2ML IJ SOLN
INTRAMUSCULAR | Status: AC
  Filled 2017-03-01: qty 2

## 2017-03-01 MED ORDER — ONDANSETRON HCL 4 MG PO TABS: 4.0000 mg | ORAL_TABLET | Freq: Four times a day (QID) | ORAL | Status: DC | PRN

## 2017-03-01 MED ORDER — FENTANYL CITRATE (PF) 100 MCG/2ML IJ SOLN
INTRAMUSCULAR | Status: AC
  Administered 2017-03-01: 100 ug
  Filled 2017-03-01: qty 2

## 2017-03-01 MED ORDER — FENTANYL CITRATE (PF) 250 MCG/5ML IJ SOLN
INTRAMUSCULAR | Status: AC
  Filled 2017-03-01: qty 5

## 2017-03-01 MED ORDER — LEVOTHYROXINE SODIUM 75 MCG PO TABS
75.0000 ug | ORAL_TABLET | Freq: Every day | ORAL | Status: DC
  Administered 2017-03-02 – 2017-03-05 (×4): 75 ug via ORAL
  Filled 2017-03-01 (×4): qty 1

## 2017-03-01 MED ORDER — HYDROCODONE-ACETAMINOPHEN 5-325 MG PO TABS
1.0000 | ORAL_TABLET | ORAL | Status: DC | PRN
  Administered 2017-03-02 – 2017-03-05 (×12): 1 via ORAL
  Filled 2017-03-01 (×12): qty 1

## 2017-03-01 MED ORDER — LORATADINE 10 MG PO TABS
10.0000 mg | ORAL_TABLET | Freq: Every day | ORAL | Status: DC
  Administered 2017-03-01 – 2017-03-05 (×5): 10 mg via ORAL
  Filled 2017-03-01 (×5): qty 1

## 2017-03-01 MED ORDER — FENTANYL CITRATE (PF) 100 MCG/2ML IJ SOLN
INTRAMUSCULAR | Status: DC | PRN
  Administered 2017-03-01: 100 ug via INTRAVENOUS

## 2017-03-01 MED ORDER — KETOROLAC TROMETHAMINE 15 MG/ML IJ SOLN
INTRAMUSCULAR | Status: AC
  Administered 2017-03-01: 19:00:00
  Filled 2017-03-01: qty 1

## 2017-03-01 MED ORDER — HYDROMORPHONE HCL 1 MG/ML IJ SOLN: 0.2500 mg | INTRAMUSCULAR | Status: DC | PRN

## 2017-03-01 MED ORDER — MIDAZOLAM HCL 2 MG/2ML IJ SOLN
INTRAMUSCULAR | Status: AC
  Administered 2017-03-01: 2 mg
  Filled 2017-03-01: qty 2

## 2017-03-01 SURGICAL SUPPLY — 61 items
BASEPLATE GLENOID SHLDR SM (Shoulder) ×3 IMPLANT
BLADE SAW SGTL 83.5X18.5 (BLADE) ×3 IMPLANT
COVER SURGICAL LIGHT HANDLE (MISCELLANEOUS) ×3 IMPLANT
CUP SUT UNIV REVERS 39 NEU (Shoulder) ×3 IMPLANT
DERMABOND ADHESIVE PROPEN (GAUZE/BANDAGES/DRESSINGS) ×2
DERMABOND ADVANCED (GAUZE/BANDAGES/DRESSINGS) ×2
DERMABOND ADVANCED .7 DNX12 (GAUZE/BANDAGES/DRESSINGS) ×1 IMPLANT
DERMABOND ADVANCED .7 DNX6 (GAUZE/BANDAGES/DRESSINGS) ×1 IMPLANT
DRAPE ORTHO SPLIT 77X108 STRL (DRAPES) ×4
DRAPE SURG 17X11 SM STRL (DRAPES) ×3 IMPLANT
DRAPE SURG ORHT 6 SPLT 77X108 (DRAPES) ×2 IMPLANT
DRAPE U-SHAPE 47X51 STRL (DRAPES) ×3 IMPLANT
DRSG AQUACEL AG ADV 3.5X10 (GAUZE/BANDAGES/DRESSINGS) ×3 IMPLANT
DURAPREP 26ML APPLICATOR (WOUND CARE) ×3 IMPLANT
ELECT BLADE 4.0 EZ CLEAN MEGAD (MISCELLANEOUS) ×3
ELECT CAUTERY BLADE 6.4 (BLADE) ×3 IMPLANT
ELECT REM PT RETURN 9FT ADLT (ELECTROSURGICAL) ×3
ELECTRODE BLDE 4.0 EZ CLN MEGD (MISCELLANEOUS) ×1 IMPLANT
ELECTRODE REM PT RTRN 9FT ADLT (ELECTROSURGICAL) ×1 IMPLANT
FACESHIELD WRAPAROUND (MASK) ×9 IMPLANT
GLENOSPHERE LAT 39+4 SHOULDER (Shoulder) ×3 IMPLANT
GLOVE BIO SURGEON STRL SZ7.5 (GLOVE) ×3 IMPLANT
GLOVE BIO SURGEON STRL SZ8 (GLOVE) ×3 IMPLANT
GLOVE EUDERMIC 7 POWDERFREE (GLOVE) ×3 IMPLANT
GLOVE SS BIOGEL STRL SZ 7.5 (GLOVE) ×1 IMPLANT
GLOVE SUPERSENSE BIOGEL SZ 7.5 (GLOVE) ×2
GOWN STRL REUS W/ TWL LRG LVL3 (GOWN DISPOSABLE) ×1 IMPLANT
GOWN STRL REUS W/ TWL XL LVL3 (GOWN DISPOSABLE) ×2 IMPLANT
GOWN STRL REUS W/TWL LRG LVL3 (GOWN DISPOSABLE) ×2
GOWN STRL REUS W/TWL XL LVL3 (GOWN DISPOSABLE) ×4
INSERT HUMERAL 39/+6 (Insert) ×3 IMPLANT
KIT BASIN OR (CUSTOM PROCEDURE TRAY) ×3 IMPLANT
KIT ROOM TURNOVER OR (KITS) ×3 IMPLANT
MANIFOLD NEPTUNE II (INSTRUMENTS) ×3 IMPLANT
NDL SUT 6 .5 CRC .975X.05 MAYO (NEEDLE) IMPLANT
NEEDLE MAYO TAPER (NEEDLE)
NEEDLE TAPERED W/ NITINOL LOOP (MISCELLANEOUS) ×3 IMPLANT
NS IRRIG 1000ML POUR BTL (IV SOLUTION) ×3 IMPLANT
PACK SHOULDER (CUSTOM PROCEDURE TRAY) ×3 IMPLANT
PAD ARMBOARD 7.5X6 YLW CONV (MISCELLANEOUS) ×6 IMPLANT
RESTRAINT HEAD UNIVERSAL NS (MISCELLANEOUS) ×3 IMPLANT
SCREW CENTRAL NONLOCK 6.5X20MM (Shoulder) ×3 IMPLANT
SCREW LOCK GLENOID UNI PERI (Screw) ×3 IMPLANT
SCREW LOCK PERIPHERAL 30MM (Shoulder) ×3 IMPLANT
SET PIN UNIVERSAL REVERSE (SET/KITS/TRAYS/PACK) ×3 IMPLANT
SLEEVE CABLE 2MM VT (Orthopedic Implant) ×3 IMPLANT
SLING ARM FOAM STRAP LRG (SOFTGOODS) IMPLANT
SLING ARM FOAM STRAP XLG (SOFTGOODS) ×3 IMPLANT
SPONGE LAP 18X18 X RAY DECT (DISPOSABLE) ×3 IMPLANT
SPONGE LAP 4X18 X RAY DECT (DISPOSABLE) ×3 IMPLANT
STEM HUMERAL UNI REVERS SZ6 (Stem) ×3 IMPLANT
SUT FIBERWIRE #2 38 T-5 BLUE (SUTURE) ×9
SUT MNCRL AB 3-0 PS2 18 (SUTURE) ×3 IMPLANT
SUT MON AB 2-0 CT1 27 (SUTURE) ×3 IMPLANT
SUT MON AB 2-0 CT1 36 (SUTURE) ×3 IMPLANT
SUT VIC AB 1 CT1 27 (SUTURE) ×6
SUT VIC AB 1 CT1 27XBRD ANBCTR (SUTURE) ×3 IMPLANT
SUTURE FIBERWR #2 38 T-5 BLUE (SUTURE) ×3 IMPLANT
TOWEL OR 17X24 6PK STRL BLUE (TOWEL DISPOSABLE) ×3 IMPLANT
TOWEL OR 17X26 10 PK STRL BLUE (TOWEL DISPOSABLE) ×3 IMPLANT
WATER STERILE IRR 1000ML POUR (IV SOLUTION) ×3 IMPLANT

## 2017-03-01 NOTE — Anesthesia Procedure Notes (Signed)
Procedure Name: Intubation Date/Time: 03/01/2017 1:52 PM Performed by: White, Amedeo Plenty, CRNA Pre-anesthesia Checklist: Patient identified, Emergency Drugs available, Suction available and Patient being monitored Patient Re-evaluated:Patient Re-evaluated prior to induction Oxygen Delivery Method: Circle System Utilized Preoxygenation: Pre-oxygenation with 100% oxygen Induction Type: IV induction Ventilation: Mask ventilation without difficulty Laryngoscope Size: Mac and 3 Grade View: Grade I Tube type: Oral Tube size: 7.0 mm Number of attempts: 1 Airway Equipment and Method: Stylet and Oral airway Placement Confirmation: ETT inserted through vocal cords under direct vision,  positive ETCO2 and breath sounds checked- equal and bilateral Secured at: 21 cm Tube secured with: Tape Dental Injury: Teeth and Oropharynx as per pre-operative assessment

## 2017-03-01 NOTE — Anesthesia Procedure Notes (Signed)
Anesthesia Regional Block: Interscalene brachial plexus block   Pre-Anesthetic Checklist: ,, timeout performed, Correct Patient, Correct Site, Correct Laterality, Correct Procedure,, site marked, risks and benefits discussed, Surgical consent,  Pre-op evaluation,  At surgeon's request and post-op pain management  Laterality: Left  Prep: chloraprep       Needles:  Injection technique: Single-shot  Needle Type: Echogenic Stimulator Needle     Needle Length: 5cm  Needle Gauge: 22     Additional Needles:   Procedures:, nerve stimulator,,,, intact distal pulses,,,   Nerve Stimulator or Paresthesia:  Response: bicep contraction, 0.48 mA,   Additional Responses:   Narrative:  Start time: 03/01/2017 1:14 PM End time: 03/01/2017 1:24 PM Injection made incrementally with aspirations every 5 mL.  Performed by: Personally   Additional Notes: Functioning IV was confirmed and monitors applied.  A 22mm 22ga echogenic arrow stimulator was used. Sterile prep and drape,hand hygiene and sterile gloves were used.Ultrasound guidance: relevent anatomy identified, needle position confirmed, local anesthetic spread visualized around nerve(s)., vascular puncture avoided.  Image printed for medical record.  Negative aspiration and negative test dose prior to incremental administration of local anesthetic. The patient tolerated the procedure well.

## 2017-03-01 NOTE — Op Note (Signed)
03/01/2017  5:22 PM  PATIENT:   Cheryl Lane  66 y.o. female  PRE-OPERATIVE DIAGNOSIS:  Left proximal humerus fracture and end stage glenohumeral OA  POST-OPERATIVE DIAGNOSIS:  same  PROCEDURE:  L RSA #6 stem, +6 poly, 39/+4 glenosphere, small baseplate  SURGEON:  Abhiram Criado, Metta Clines M.D.  ASSISTANTS: Shuford pac   ANESTHESIA:   GET + ISB  EBL: 200  SPECIMEN:  none  Drains: none   PATIENT DISPOSITION:  PACU - hemodynamically stable.    PLAN OF CARE: Admit to inpatient   Dictation# [id number]   Contact # [phone]

## 2017-03-01 NOTE — Discharge Instructions (Signed)
° °  Metta Clines. Supple, M.D., F.A.A.O.S. Orthopaedic Surgery Specializing in Arthroscopic and Reconstructive Surgery of the Shoulder and Knee [phone] [address] [place], [place] [address] - Fax [phone]   POST-OP TOTAL SHOULDER REPLACEMENT INSTRUCTIONS  1. Call the office at [phone] to schedule your first post-op appointment 10-14 days from the date of your surgery.  2. The bandage over your incision is waterproof. You may begin showering with this dressing on. You may leave this dressing on until first follow up appointment within 2 weeks. We prefer you leave this dressing in place until follow up however after 5-7 days if you are having itching or skin irritation and would like to remove it you may do so. Go slow and tug at the borders gently to break the bond the dressing has with the skin. At this point if there is no drainage it is okay to go without a bandage or you may cover it with a light guaze and tape. You can also expect significant bruising around your shoulder that will drift down your arm and into your chest wall. This is very normal and should resolve over several days.   3. Wear your sling/immobilizer at all times except to perform the exercises below or to occasionally let your arm dangle by your side to stretch your elbow. You also need to sleep in your sling immobilizer until instructed otherwise.  4. Range of motion to your elbow, wrist, and hand are encouraged 3-5 times daily. Exercise to your hand and fingers helps to reduce swelling you may experience.  5. Utilize ice to the shoulder 3-5 times minimum a day and additionally if you are experiencing pain.  6. Prescriptions for a pain medication and a muscle relaxant are provided for you. It is recommended that if you are experiencing pain that you pain medication alone is not controlling, add the muscle relaxant along with the pain medication which can give additional pain relief. The first 1-2 days  is generally the most severe of your pain and then should gradually decrease. As your pain lessens it is recommended that you decrease your use of the pain medications to an "as needed basis'" only and to always comply with the recommended dosages of the pain medications.  7. Pain medications can produce constipation along with their use. If you experience this, the use of an over the counter stool softener or laxative daily is recommended.   8. For additional questions or concerns, please do not hesitate to call the office. If after hours there is an answering service to forward your concerns to the physician on call.    OK to allow arm to dangle and move elbow wrist and hand.

## 2017-03-01 NOTE — Transfer of Care (Signed)
Immediate Anesthesia Transfer of Care Note  Patient: [name]  Procedure(s) Performed: REVERSE SHOULDER ARTHROPLASTY (Left Shoulder)  Patient Location: PACU  Anesthesia Type:GA combined with regional for post-op pain  Level of Consciousness: awake, alert  and patient cooperative  Airway & Oxygen Therapy: Patient Spontanous Breathing and Patient connected to nasal cannula oxygen  Post-op Assessment: Report given to RN and Post -op Vital signs reviewed and stable  Post vital signs: Reviewed and stable  Last Vitals:  Vitals:   03/01/17 1335 03/01/17 1345  BP: (!) 176/78 (!) 169/84  Pulse: 97 92  Resp: (!) 26 (!) 28  Temp:    SpO2: 98% 96%    Last Pain:  Vitals:   03/01/17 1345  TempSrc:   PainSc: 7       Patients Stated Pain Goal: 5 (53/96/72 8979)  Complications: No apparent anesthesia complications

## 2017-03-01 NOTE — H&P (Signed)
Cheryl Lane    Chief Complaint: Left proximal humerus fracture HPI: The patient is a 66 y.o. female with displaced left proximal humerus fracture and underlying end stage arthritis.  Past Medical History:  Diagnosis Date  . Anemia    as a child  . Arthritis   . Asthma    bronchitis  . Cervical radiculopathy   . Chronic pain in left shoulder   . CKD (chronic kidney disease)    pt not aware of this  . Depression   . Hypertension   . Hypothyroidism   . Pneumonia   . Pre-diabetes     Past Surgical History:  Procedure Laterality Date  . APPENDECTOMY    . TUBAL LIGATION      Family History  Problem Relation Age of Onset  . Hypertension Other   . Breast cancer Mother 58  . Breast cancer Sister 35  . Lung cancer Brother   . Cancer Sister 22       "female" cancer  . Cancer Sister        unknown cancer  . Breast cancer Sister 30    Social History:  reports that  has never smoked. she has never used smokeless tobacco. She reports that she does not drink alcohol or use drugs.   Medications Prior to Admission  Medication Sig Dispense Refill  . escitalopram (LEXAPRO) 10 MG tablet Take 10 mg daily by mouth.    . levothyroxine (SYNTHROID, LEVOTHROID) 75 MCG tablet Take 75 mcg daily before breakfast by mouth.    Marland Kitchen lisinopril (PRINIVIL,ZESTRIL) 10 MG tablet Take 2 tablets (20 mg total) by mouth daily. (Patient taking differently: Take 20 mg at bedtime by mouth. )    . loratadine (CLARITIN) 10 MG tablet Take 1 tablet (10 mg total) by mouth daily. 10 tablet 0  . meloxicam (MOBIC) 15 MG tablet Take 15 mg daily by mouth.    . oxyCODONE-acetaminophen (PERCOCET/ROXICET) 5-325 MG tablet Take 2 tablets by mouth every 4 (four) hours as needed for severe pain. (Patient taking differently: Take 1-2 tablets every 4 (four) hours as needed by mouth for severe pain. ) 15 tablet 0  . traMADol (ULTRAM) 50 MG tablet Take 1 tablet (50 mg total) by mouth every 6 (six) hours as needed for  moderate pain. 15 tablet 0  . albuterol (PROVENTIL HFA;VENTOLIN HFA) 108 (90 Base) MCG/ACT inhaler Inhale 2 puffs into the lungs every 4 (four) hours as needed for wheezing or shortness of breath. (Patient not taking: Reported on 02/28/2017) 18 g 0  . albuterol (PROVENTIL) (2.5 MG/3ML) 0.083% nebulizer solution Take 3 mLs (2.5 mg total) by nebulization every 4 (four) hours as needed for wheezing or shortness of breath. (Patient not taking: Reported on 02/28/2017) 75 mL 0  . ALPRAZolam (XANAX) 0.25 MG tablet Take 1 tablet (0.25 mg total) by mouth 3 (three) times daily as needed for anxiety. (Patient not taking: Reported on 02/28/2017) 15 tablet 0  . amLODipine (NORVASC) 10 MG tablet Take 1 tablet (10 mg total) by mouth daily. (Patient not taking: Reported on 02/28/2017) 30 tablet 0  . benzonatate (TESSALON) 200 MG capsule Take 1 capsule (200 mg total) by mouth 3 (three) times daily as needed for cough. (Patient not taking: Reported on 02/28/2017) 20 capsule 0  . fluticasone (FLONASE) 50 MCG/ACT nasal spray Place 2 sprays into both nostrils daily. (Patient not taking: Reported on 02/28/2017) 16 g 0  . guaiFENesin (MUCINEX) 600 MG 12 hr tablet Take 1 tablet (600  mg total) by mouth 2 (two) times daily. (Patient not taking: Reported on 02/28/2017) 15 tablet 0  . ipratropium-albuterol (DUONEB) 0.5-2.5 (3) MG/3ML SOLN Take 3 mLs by nebulization every 8 (eight) hours. (Patient not taking: Reported on 02/28/2017) 360 mL 0     Physical Exam: left shoulder with diffuse tenderness and limited motion as noted at recent office visit  Vitals  Temp:  [98.6 F (37 C)] 98.6 F (37 C) (11/08 1200) Pulse Rate:  [84-97] 92 (11/08 1345) Resp:  [16-28] 28 (11/08 1345) BP: (150-208)/(67-106) 169/84 (11/08 1345) SpO2:  [93 %-100 %] 96 % (11/08 1345) Weight:  [124.7 kg (275 lb)] 124.7 kg (275 lb) (11/08 1200)  Assessment/Plan  Impression: Left proximal humerus fracture  Plan of Action: Procedure(s): REVERSE SHOULDER  ARTHROPLASTY  Katherine Tout M Lani Mendiola 03/01/2017, 2:53 PM Contact # [phone]

## 2017-03-01 NOTE — Anesthesia Preprocedure Evaluation (Addendum)
Anesthesia Evaluation  Patient identified by MRN, date of birth, ID band Patient awake    Reviewed: Allergy & Precautions, NPO status , Patient's Chart, lab work & pertinent test results  Airway Mallampati: II  TM Distance: >3 FB Neck ROM: Full    Dental no notable dental hx. (+) Edentulous Upper, Edentulous Lower, Dental Advisory Given   Pulmonary neg pulmonary ROS,    Pulmonary exam normal        Cardiovascular hypertension, Normal cardiovascular exam     Neuro/Psych PSYCHIATRIC DISORDERS Anxiety Depression negative neurological ROS     GI/Hepatic negative GI ROS, Neg liver ROS,   Endo/Other  Hypothyroidism   Renal/GU Renal disease     Musculoskeletal   Abdominal   Peds  Hematology   Anesthesia Other Findings   Reproductive/Obstetrics                            Anesthesia Physical Anesthesia Plan  ASA: III  Anesthesia Plan: General   Post-op Pain Management: GA combined w/ Regional for post-op pain   Induction:   PONV Risk Score and Plan: 3 and Ondansetron, Dexamethasone and Scopolamine patch - Pre-op  Airway Management Planned: Oral ETT  Additional Equipment:   Intra-op Plan:   Post-operative Plan: Extubation in OR  Informed Consent: I have reviewed the patients History and Physical, chart, labs and discussed the procedure including the risks, benefits and alternatives for the proposed anesthesia with the patient or authorized representative who has indicated his/her understanding and acceptance.   Dental advisory given  Plan Discussed with: CRNA, Anesthesiologist and Surgeon  Anesthesia Plan Comments:        Anesthesia Quick Evaluation

## 2017-03-02 ENCOUNTER — Encounter (HOSPITAL_COMMUNITY): Payer: Self-pay | Admitting: Orthopedic Surgery

## 2017-03-02 ENCOUNTER — Other Ambulatory Visit: Payer: Self-pay

## 2017-03-02 MED ORDER — OXYCODONE-ACETAMINOPHEN 5-325 MG PO TABS: 1.0000 | ORAL_TABLET | ORAL | 0 refills | Status: DC | PRN

## 2017-03-02 MED ORDER — INFLUENZA VAC SPLIT HIGH-DOSE 0.5 ML IM SUSY
0.5000 mL | PREFILLED_SYRINGE | INTRAMUSCULAR | Status: AC
  Administered 2017-03-04: 0.5 mL via INTRAMUSCULAR
  Filled 2017-03-02: qty 0.5

## 2017-03-02 MED ORDER — DIAZEPAM 5 MG PO TABS: 2.5000 mg | ORAL_TABLET | Freq: Four times a day (QID) | ORAL | 0 refills | Status: DC | PRN

## 2017-03-02 NOTE — Evaluation (Signed)
Physical Therapy Evaluation Patient Details Name: Cheryl Lane MRN: [id number] DOB: 09/29/1950 Today's Date: 03/02/2017   History of Present Illness  66 y.o. female s/p L THA 03/01/17. Pertinent PMH includes: Cervical radiculopathy, Arthritis, Depression, HTN, Anemia, Asthma.  Clinical Impression    Patient is s/p above surgery resulting in functional limitations due to the deficits listed below (see PT Problem List). PTA, pt required assistance with ADLs and was using an assistive device for ambulation. Today, pt is limited with post op pain and weakness, decreased strength in her BLE, anxiety and self limiting behavior with mobility. Pt educated on benefits of mobility and importance of participation in therapy. Due to weakness in BLE and WB status of LUE, patient requiring heavy assistance with transfers and gait. I feel patient can progress level of independence and work up her confidence in next several sessions.  Patient will benefit from skilled PT to increase their independence and safety with mobility to allow discharge to the venue listed below.       Follow Up Recommendations SNF;DC plan and follow up therapy as arranged by surgeon    Equipment Recommendations  None recommended by PT    Recommendations for Other Services       Precautions / Restrictions Precautions Precautions: Fall;Shoulder Shoulder Interventions: Shoulder sling/immobilizer Precaution Booklet Issued: No Restrictions Weight Bearing Restrictions: Yes LUE Weight Bearing: Non weight bearing      Mobility  Bed Mobility               General bed mobility comments: patient OOB upon entry  Transfers Overall transfer level: Needs assistance Equipment used: Rolling walker (2 wheeled);1 person hand held assist Transfers: Sit to/from Stand Sit to Stand: Mod assist         General transfer comment: Pt reliant on UE support for sit<>stand at baseline, anxious and self limiting for transfers  without use of LUE. Mod Ax1 to power up.   Ambulation/Gait Ambulation/Gait assistance: Mod assist Ambulation Distance (Feet): 2 Feet Assistive device: Rolling walker (2 wheeled) Gait Pattern/deviations: Decreased step length - right;Decreased step length - left;Antalgic Gait velocity: decreased   General Gait Details: Pt fearful of taking steps due to weak and painful knees from severe arthritis.   Stairs            Wheelchair Mobility    Modified Rankin (Stroke Patients Only)       Balance Overall balance assessment: Needs assistance Sitting-balance support: Single extremity supported Sitting balance-Leahy Scale: Good     Standing balance support: Single extremity supported Standing balance-Leahy Scale: Poor Standing balance comment: Heavy reliance on BUE support at baseline. Pt able to tolerate static standing for 10 minutes with 1 UE support on RW and contact guard                              Pertinent Vitals/Pain Pain Assessment: 0-10 Pain Score: 8  Pain Location: L shoulder Pain Descriptors / Indicators: Operative site guarding;Sharp;Grimacing Pain Intervention(s): Limited activity within patient's tolerance;Monitored during session;Premedicated before session    Home Living Family/patient expects to be discharged to:: Private residence Living Arrangements: Children Available Help at Discharge: Family;Home health;Personal care attendant;Available 24 hours/day Type of Home: House Home Access: Stairs to enter Entrance Stairs-Rails: Right Entrance Stairs-Number of Steps: 3 Home Layout: Two level;1/2 bath on main level Home Equipment: Walker - 4 wheels;Cane - single point      Prior Function Level of Independence: Needs  assistance   Gait / Transfers Assistance Needed: used cane indoors most of time and rollator on bad days.  Used rollator outdoors and community.  ADL's / Homemaking Assistance Needed: aide sponge bathed pt        Hand  Dominance        Extremity/Trunk Assessment   Upper Extremity Assessment Upper Extremity Assessment: Defer to OT evaluation    Lower Extremity Assessment Lower Extremity Assessment: Generalized weakness(Strength RLE 3/5    LLE 3-/5)       Communication   Communication: No difficulties  Cognition Arousal/Alertness: Awake/alert Behavior During Therapy: WFL for tasks assessed/performed Overall Cognitive Status: Within Functional Limits for tasks assessed                                        General Comments General comments (skin integrity, edema, etc.): daughter and 2 friends in room throughout session. SpO2=>95% throughout session. BP=166/100 at rest, 155/98 after sit<>stand.  Extensive conversation about the importance of mobility during hospital stay.     Exercises General Exercises - Lower Extremity Long Arc Quad: AROM;Both;10 reps   Assessment/Plan    PT Assessment Patient needs continued PT services  PT Problem List Decreased strength;Decreased range of motion;Decreased activity tolerance;Decreased balance;Decreased mobility;Decreased knowledge of use of DME;Pain       PT Treatment Interventions Gait training;DME instruction;Stair training;Functional mobility training;Therapeutic activities;Therapeutic exercise;Balance training    PT Goals (Current goals can be found in the Care Plan section)  Acute Rehab PT Goals Patient Stated Goal: to go to rehab PT Goal Formulation: With patient/family Time For Goal Achievement: 03/16/17 Potential to Achieve Goals: Good    Frequency Min 3X/week   Barriers to discharge        Co-evaluation               AM-PAC PT "6 Clicks" Daily Activity  Outcome Measure Difficulty turning over in bed (including adjusting bedclothes, sheets and blankets)?: Unable Difficulty moving from lying on back to sitting on the side of the bed? : Unable Difficulty sitting down on and standing up from a chair with arms  (e.g., wheelchair, bedside commode, etc,.)?: Unable Help needed moving to and from a bed to chair (including a wheelchair)?: A Lot Help needed walking in hospital room?: A Lot Help needed climbing 3-5 steps with a railing? : Total 6 Click Score: 8    End of Session Equipment Utilized During Treatment: Gait belt Activity Tolerance: Patient limited by pain Patient left: in chair;with call bell/phone within reach;with family/visitor present Nurse Communication: Mobility status PT Visit Diagnosis: Unsteadiness on feet (R26.81);Other abnormalities of gait and mobility (R26.89);Repeated falls (R29.6);Muscle weakness (generalized) (M62.81);Pain Pain - Right/Left: Left Pain - part of body: Shoulder    Time: 0109-3235 PT Time Calculation (min) (ACUTE ONLY): 38 min   Charges:   PT Evaluation $PT Eval Moderate Complexity: 1 Mod PT Treatments $Therapeutic Activity: 8-22 mins   PT G Codes:        Reinaldo Berber, PT, DPT Acute Rehab Services Pager: [phone]    Reinaldo Berber 03/02/2017, 1:31 PM

## 2017-03-02 NOTE — Evaluation (Addendum)
Occupational Therapy Evaluation Patient Details Name: Cheryl Lane MRN: [id number] DOB: [birth year] Today's Date: 03/02/2017    History of Present Illness 66 y.o. female presents with displaced left proximal humerus fracture after mechanical fall and underlying end stage arthritis; Pt now s/p reverse L TSA 03/01/17. Pertinent PMH includes: Cervical radiculopathy, Arthritis, Depression, HTN, Anemia, Asthma.   Clinical Impression   This 66 y/o F presents with the above. At baseline Pt is mod independent with ADLs and functional mobility, alternating between [facility] and RW. Pt required ModA +2 for stand pivot transfer this session, partly due to Pt increasingly anxious with mobility due to recent fall; Pt requires MaxA for UB/LB ADLs secondary to decreased dynamic balance, pain, and LUE functional limitations. Education provided on shoulder precautions and compensatory techniques for completing ADLs during session, though will benefit from continued review/practice. Pt reports plans for ST rehab in SNF setting after discharge. Feel this recommendation is appropriate due to Pt's current level of assist and as Pt does not have 24 hr physical assist at home. Will continue to follow acutely to progress Pt's safety, independence, and understanding of shoulder protocol during ADL and mobility completion.     Follow Up Recommendations  DC plan and follow up therapy as arranged by surgeon;SNF    Equipment Recommendations  Other (comment)(defer to next venue )           Precautions / Restrictions Precautions Precautions: Fall;Shoulder Type of Shoulder Precautions: Passive Protocol: NWB in UE; wear sling at all times except for ADL/exercise; No AROM of shoulder, no pendulums, may allow arm to dangle; okay for AROM to elbow, wrist, hand to tolerance; shoulder PROM for ADLs only (not for exercise): ER 0-10, ABD 0-45, FE 0-60 Shoulder Interventions: Off for dressing/bathing/exercises;Shoulder  sling/immobilizer;At all times Precaution Booklet Issued: Yes (comment) Precaution Comments: issued and reviewed with Pt  Restrictions Weight Bearing Restrictions: Yes LUE Weight Bearing: Non weight bearing      Mobility Bed Mobility Overal bed mobility: Needs Assistance Bed Mobility: Supine to Sit     Supine to sit: HOB elevated;Max assist     General bed mobility comments: light assist to advance LEs towards EOB with assist to bring trunk upright using HHA of RUE, assist for scooting towards EOB  Transfers Overall transfer level: Needs assistance Equipment used: 1 person hand held assist Transfers: Sit to/from [employer] Transfers Sit to Stand: Mod assist;+2 physical assistance;+2 safety/equipment Stand pivot transfers: Mod assist;+2 physical assistance;+2 safety/equipment       General transfer comment: Pt reliant on UE support for sit<>stand at baseline, anxious and self limiting for transfers without use of LUE. Mod A +2 for sit<>stand using RUE support; Pt taking small cautious steps to transfer to recliner    Balance Overall balance assessment: Needs assistance Sitting-balance support: Single extremity supported Sitting balance-Leahy Scale: Good     Standing balance support: Single extremity supported Standing balance-Leahy Scale: Poor Standing balance comment: reliance on BUE support at baseline. Pt requires external HHA to maintain static standing                            ADL either performed or assessed with clinical judgement   ADL Overall ADL's : Needs assistance/impaired Eating/Feeding: Set up;Sitting Eating/Feeding Details (indicate cue type and reason): assist to open containers  Grooming: Set up;Sitting   Upper Body Bathing: Moderate assistance;Sitting   Lower Body Bathing: Sit to/from stand;Maximal assistance   Upper Body Dressing :  Maximal assistance;Sitting Upper Body Dressing Details (indicate cue type and reason): maxA to  doff/don sling  Lower Body Dressing: Total assistance;Sit to/from stand   Toilet Transfer: Moderate assistance;+2 for physical assistance;+2 for safety/equipment;Stand-pivot;BSC   Toileting- Clothing Manipulation and Hygiene: Maximal assistance;Sit to/from stand;+2 for safety/equipment       Functional mobility during ADLs: Moderate assistance;+2 for physical assistance;+2 for safety/equipment(for stand pivot ) General ADL Comments: education provided on compensatory techniques for completing ADLs while adhering to shoulder precautions; Pt increasingly anxious with mobility due to fear of falling, requiring increased assist for safety during transfer                         Pertinent Vitals/Pain Pain Assessment: 0-10 Pain Score: 6  Pain Location: L shoulder Pain Descriptors / Indicators: Operative site guarding;Sharp;Grimacing Pain Intervention(s): Limited activity within patient's tolerance;Monitored during session;Ice applied;RN gave pain meds during session     Hand Dominance Right   Extremity/Trunk Assessment Upper Extremity Assessment Upper Extremity Assessment: LUE deficits/detail LUE Deficits / Details: s/p reverse L TSA; still experiencing minimal numbness/tingling from nerve block LUE: Unable to fully assess due to immobilization;Unable to fully assess due to pain   Lower Extremity Assessment Lower Extremity Assessment: Defer to PT evaluation       Communication Communication Communication: No difficulties   Cognition Arousal/Alertness: Awake/alert Behavior During Therapy: WFL for tasks assessed/performed Overall Cognitive Status: Within Functional Limits for tasks assessed                                           Exercises Shoulder Exercises Elbow Flexion: AAROM;AROM;10 reps;Left;Seated Elbow Extension: AAROM;AROM;Left;Seated Wrist Flexion: AROM;AAROM;10 reps;Left;Seated Wrist Extension: AROM;AAROM;10 reps;Left;Seated Digit  Composite Flexion: AROM;10 reps;Left;Seated Composite Extension: AROM;10 reps;Left;Seated Hand Exercises Forearm Supination: AAROM;AROM;10 reps;Left;Seated Forearm Pronation: AROM;AAROM;10 reps;Left;Seated   Shoulder Instructions Shoulder Instructions Donning/doffing shirt without moving shoulder: Maximal assistance Method for sponge bathing under operated UE: Moderate assistance Donning/doffing sling/immobilizer: Maximal assistance Correct positioning of sling/immobilizer: Maximal assistance ROM for elbow, wrist and digits of operated UE: Minimal assistance Sling wearing schedule (on at all times/off for ADL's): Minimal assistance Proper positioning of operated UE when showering: Minimal assistance Positioning of UE while sleeping: Moderate assistance    Home Living Family/patient expects to be discharged to:: Private residence Living Arrangements: Children Available Help at Discharge: Family;Home health;Personal care attendant;Available 24 hours/day Type of Home: House Home Access: Stairs to enter [employer] of Steps: 3 Entrance Stairs-Rails: Right Home Layout: Two level;1/2 bath on main level     Bathroom Shower/Tub: None   Bathroom Toilet: Handicapped height     Home Equipment: Environmental consultant - 4 wheels;Cane - single point          Prior Functioning/Environment Level of Independence: Needs assistance  Gait / Transfers Assistance Needed: used cane indoors most of time and rollator on bad days.  Used rollator outdoors and community. ADL's / Homemaking Assistance Needed: aide sponge bathed pt   Comments: Pt reports she lives with her daughter and son-in-law; daughter is not in good health and not able to provide necessary assist at discharge         OT Problem List: Decreased strength;Decreased activity tolerance;Decreased range of motion;Decreased knowledge of precautions;Pain;Obesity;Impaired UE functional use      OT Treatment/Interventions: Self-care/ADL  training;DME and/or AE instruction;Therapeutic activities;Balance training;Therapeutic exercise;Energy conservation;Patient/family education    OT  Goals(Current goals can be found in the care plan section) Acute Rehab OT Goals Patient Stated Goal: to go to rehab OT Goal Formulation: With patient Time For Goal Achievement: 03/16/17 Potential to Achieve Goals: Good  OT Frequency: Min 2X/week                             AM-PAC PT "6 Clicks" Daily Activity     Outcome Measure Help from another person eating meals?: A Little Help from another person taking care of personal grooming?: A Little Help from another person toileting, which includes using toliet, bedpan, or urinal?: A Lot Help from another person bathing (including washing, rinsing, drying)?: A Lot Help from another person to put on and taking off regular upper body clothing?: A Lot Help from another person to put on and taking off regular lower body clothing?: A Lot 6 Click Score: 14   End of Session Equipment Utilized During Treatment: Gait belt;Other (comment)(sling ) Nurse Communication: Mobility status  Activity Tolerance: Patient tolerated treatment well Patient left: in chair;with call bell/phone within reach  OT Visit Diagnosis: Unsteadiness on feet (R26.81);History of falling (Z91.81);Pain Pain - Right/Left: Left Pain - part of body: Shoulder                Time: 7473-4037 OT Time Calculation (min): 50 min Charges:  OT General Charges $OT Visit: 1 Visit OT Evaluation $OT Eval Moderate Complexity: 1 Mod OT Treatments $Self Care/Home Management : 8-22 mins $Therapeutic Activity: 8-22 mins G-Codes:     Lou Cal, OT Pager [phone] 03/02/2017   Raymondo Band 03/02/2017, 4:12 PM

## 2017-03-02 NOTE — Anesthesia Postprocedure Evaluation (Signed)
Anesthesia Post Note  Patient: Cheryl Lane  Procedure(s) Performed: REVERSE SHOULDER ARTHROPLASTY (Left Shoulder)     Patient location during evaluation: PACU Anesthesia Type: General Level of consciousness: awake Pain management: pain level controlled Vital Signs Assessment: post-procedure vital signs reviewed and stable Respiratory status: spontaneous breathing Cardiovascular status: stable Anesthetic complications: no    Last Vitals:  Vitals:   03/01/17 1954 03/02/17 0606  BP: (!) 148/91 (!) 158/72  Pulse: 81 71  Resp: 20 20  Temp: 36.5 C 36.7 C  SpO2: 91% 100%    Last Pain:  Vitals:   03/02/17 1105  TempSrc:   PainSc: 3                  Telitha Plath

## 2017-03-02 NOTE — Op Note (Signed)
Cheryl Lane, Cheryl Lane             ACCOUNT NO.:  [id number]  MEDICAL RECORD NO.:  [id number]  LOCATION:                               FACILITY:  [facility]  PHYSICIAN:  Metta Clines. Hoyle Barkdull, M.D.  DATE OF BIRTH:  1950-04-25  DATE OF PROCEDURE:  03/01/2017 DATE OF DISCHARGE:                              OPERATIVE REPORT   PREOPERATIVE DIAGNOSIS:  Comminuted displaced left proximal humerus fracture with underlying end-stage glenohumeral joint arthritis.  POSTOPERATIVE DIAGNOSIS:  Comminuted displaced left proximal humerus fracture with underlying end-stage glenohumeral joint arthritis.  PROCEDURE:  Left reverse shoulder arthroplasty utilizing a press-fit size 6 stem, +6 polyethylene insert, a 39/+4 glenosphere and a small baseplate.  I additionally used a Dall-Miles cable for cerclage at the humeral metaphysis for a fracture line extending into the metaphyseal region.  SURGEON:  Metta Clines. Teresha Hanks, MD.  Terrence DupontReather Laurence. Shuford, PA-C.  ANESTHESIA:  General endotracheal as well as interscalene block.  ESTIMATED BLOOD LOSS:  200 mL.  DRAINS:  None.  HISTORY:  Cheryl Lane is a 66 year old female, who fell earlier this week sustaining an impacted and significantly displaced 2 part proximal humerus fracture, which we also found had some profound comminution, and in addition, she had underlying end-stage glenohumeral joint arthritis with marked deformity of the humeral head and complete obliteration of the joint space.  On further questioning, she does report that her shoulder had been of very little function and use for many years due to severe pain and restricted mobility.  Given the degree of displacement at the fracture site and the underline end-stage osteoarthritis, she is brought to the operating room at this time for planned reverse shoulder arthroplasty.  Preoperatively, I counseled Cheryl Lane regarding treatment options and potential risks versus benefits thereof.  Possible  surgical complications were reviewed including bleeding, infection, neurovascular injury, persistent pain, loss of motion, anesthetic complication, failure of the implant, possible need for additional surgery.  She understands and accepts and agrees with our planned procedure.  DESCRIPTION OF PROCEDURE:  After undergoing routine preop evaluation, the patient received prophylactic antibiotics and an interscalene block was established in the holding area by the Anesthesia Department. Placed supine on the operating table.  Underwent smooth induction of a general endotracheal anesthesia.  Placed in beach-chair position and appropriately padded and protected.  Of note, she has an extremely large body habitus and so particular care was taken to appropriately pad and protect all extremities.  The left shoulder girdle region was then sterilely prepped and draped in a standard fashion.  Time-out was called.  An anterior deltopectoral approach to the left shoulder was made through an 8-cm incision.  Skin flaps were elevated.  Dissection was carried deeply.  Electrocautery was used for hemostasis.  The deltopectoral interval was then developed from proximal to distal.  In the process of dissection, we found that there were a number of varicosities involving the cephalic vein, and to gain appropriate exposure, it was necessary for [place] to ligate the vein both proximally and distally and wound.  We then gained exposure, mobilized the conjoined tendon and this was retracted medially.  The biceps tendon was tenotomized.  We then gained access into  the fracture site elevating the humeral head and this helped [place] to identify interval between the greater and lesser tuberosities.  I went ahead and tenotomized the subscapularis and tagged the free margin with a pair of #2 FiberWire sutures.  We then divided some of the additional remnant of the supraspinatus from the apex of the greater tuberosity and then  mobilized the greater tuberosity and this was reflected posteriorly.  This allowed [place] then to gain access to the articular surface of the humeral head and soft tissue remnants attached to it were removed.  Of note, the head was markedly deformed that had completely flattened with eburnated bone and large peripheral osteophytes.  We ultimately were able to remove the head segment in its entirety.  At this point, we gained appropriate exposure of the glenoid and performed a circumferential labral resection.  A guide pin was placed in the center of the glenoid.  We then reamed centrally and peripherally on the glenoid, impacted our base plate.  This was then transfixed with our central lag screw in the inferior and superior locking screws, all of which obtained good bony purchase and fixation. The 39/+4 glenosphere was then impacted onto the baseplate with excellent fixation.  We then returned our attention back to the humeral shaft.  We removed bone fragments from the pelvic fracture site, irrigated the region.  I then used a hand reamer of the canal and then broached up to size 6.  Trial reduction showed proper soft tissue balance and good stability.  At this point, we assembled our final implant on the back table and as we were introducing the final construct, we did observe development of fracture along the calcar of the humeral metaphyseal region.  With this finding, we placed a Dall- Miles cerclage cable at the level of the humeral metaphysis.  This was appropriately tensioned and at this point, we then went ahead and impacted our stem, which obtained excellent interference, fit, and fixation.  The Dall-Milles cable was then terminally tightened and clipped and then we performed reductions and ultimately felt that a +6 poly gave [place] good soft tissue balance and good stability.  The final +6 polyethylene insert was then impacted in position.  The shoulder joint was then reduced.  At this  point, the greater tuberosity fracture fragment was then mobilized and we passed a pair of #5 FiberWires in a grasping fashion through the bone-tendon junction of the tuberosity. This was then repaired back to the metaphyseal region through the islets on our humeral stem and then the subscapularis as well was repaired back to the collar of our stem in the region of the lesser tuberosity. Overall construct showed good stability and good soft tissue balance. Final irrigation was then completed.  Hemostasis was obtained.  The deltopectoral interval was then reapproximated with a series of figure- of-eight #1 Vicryl sutures.  2-0 Vicryl was used for subcu layer, intracuticular 3-0 Monocryl for the skin followed by Dermabond and Aquacel dressing.  Left arm was placed in a sling.  The patient was awakened, extubated, taken to the recovery room in stable condition.  Jenetta Loges, PA-C, was used as an Environmental consultant throughout this case, was essential for help with positioning the patient, positioning the extremity, tissue manipulation, implantation of prosthesis, wound closure, and intraoperative decision making.    Metta Clines. Velvia Mehrer, M.D.   ______________________________ Metta Clines. Keymora Grillot, M.D.   KMS/MEDQ  D:  03/01/2017  T:  03/02/2017  Job:  [id number]

## 2017-03-02 NOTE — Progress Notes (Signed)
Cheryl Lane  MRN: [id number] DOB/Age: 07-04-1950 66 y.o. [facility] Procedure: Procedure(s) (LRB): REVERSE SHOULDER ARTHROPLASTY (Left)     Subjective: Difficulty voiding, hasnt been out of bed yet. Block still has effects so pain under control  Vital Signs Temp:  [97.4 F (36.3 C)-98.6 F (37 C)] 98.1 F (36.7 C) (11/09 0606) Pulse Rate:  [71-113] 71 (11/09 0606) Resp:  [15-28] 20 (11/09 0606) BP: (147-208)/(67-106) 158/72 (11/09 0606) SpO2:  [91 %-100 %] 100 % (11/09 0606) Weight:  [124.7 kg (275 lb)] 124.7 kg (275 lb) (11/08 1200)  Lab Results Recent Labs    03/01/17 1141  WBC 4.5  HGB 12.5  HCT 39.5  PLT 280   BMET Recent Labs    03/01/17 1141  NA 137  K 3.9  CL 103  CO2 25  GLUCOSE 129*  BUN 15  CREATININE 1.08*  CALCIUM 9.1   No results found for: INR   Exam Dressing dry Beginning to move hand well        Plan Patient will be here through the weekend awaiting [facility] Continue attempts to mobilize Bladder scan later if needed, but PT will get out of bed which should help  [facility] PA-C  03/02/2017, 8:27 AM Contact # [phone]

## 2017-03-02 NOTE — Op Note (Deleted)
  The note originally documented on this encounter has been moved the the encounter in which it belongs.  

## 2017-03-02 NOTE — Progress Notes (Signed)
   03/02/17 1200  Clinical Encounter Type  Visited With Patient and family together  Visit Type Follow-up;Spiritual support  Referral From Nurse  Consult/Referral To Chaplain  Spiritual Encounters  Spiritual Needs Prayer  Stress Factors  Patient Stress Factors Health changes  Family Stress Factors Major life changes  Chaplain had an opportunity to pray with the family, PT and Daughter of PT.  The family was able to share stories in the midst of their family history of loss and various types of family members with illness.  The spiritual faith of the family is more than enough to sustain them.

## 2017-03-03 NOTE — Progress Notes (Signed)
Occupational Therapy Treatment Patient Details Name: Cheryl Lane MRN: [id number] DOB: 1950/06/07 Today's Date: 03/03/2017    History of present illness 66 y.o. female presents with displaced left proximal humerus fracture after mechanical fall and underlying end stage arthritis; Pt now s/p reverse L TSA 03/01/17. Pertinent PMH includes: Cervical radiculopathy, Arthritis, Depression, HTN, Anemia, Asthma.   OT comments  Pt. Able to return demo and complete AROM to L UE including elbow,wrist, and hand flex/ext. Educated on sling positioning also.  Pt. Motivated and eager for participation.    Follow Up Recommendations  DC plan and follow up therapy as arranged by surgeon;SNF    Equipment Recommendations       Recommendations for Other Services      Precautions / Restrictions Precautions Precautions: Fall;Shoulder Type of Shoulder Precautions: Passive Protocol: NWB in UE; wear sling at all times except for ADL/exercise; No AROM of shoulder, no pendulums, may allow arm to dangle; okay for AROM to elbow, wrist, hand to tolerance; shoulder PROM for ADLs only (not for exercise): ER 0-10, ABD 0-45, FE 0-60 Restrictions LUE Weight Bearing: Non weight bearing       Mobility Bed Mobility                  Transfers                      Balance                                           ADL either performed or assessed with clinical judgement   ADL                                               Vision       Perception     Praxis      Cognition Arousal/Alertness: Awake/alert Behavior During Therapy: WFL for tasks assessed/performed Overall Cognitive Status: Within Functional Limits for tasks assessed                                          Exercises General Exercises - Upper Extremity Elbow Flexion: AROM;Left;10 reps;Seated Elbow Extension: AROM;Left;10 reps;Seated Wrist Flexion: AROM;Left;10  reps;Seated Wrist Extension: AROM;Left;10 reps;Seated Digit Composite Flexion: AROM;Left;10 reps;Seated Composite Extension: AROM;Left;10 reps;Seated   Shoulder Instructions       General Comments      Pertinent Vitals/ Pain       Pain Assessment: 0-10 Pain Score: 8  Pain Location: L shoulder Pain Descriptors / Indicators: Operative site guarding;Sharp;Grimacing Pain Intervention(s): Limited activity within patient's tolerance;Monitored during session;Premedicated before session  Home Living                                          Prior Functioning/Environment              Frequency  Min 2X/week        Progress Toward Goals  OT Goals(current goals can now be found in the care plan section)  Progress towards OT goals: Progressing toward goals  Plan      Co-evaluation                 AM-PAC PT "6 Clicks" Daily Activity     Outcome Measure   Help from another person eating meals?: A Little Help from another person taking care of personal grooming?: A Little Help from another person toileting, which includes using toliet, bedpan, or urinal?: A Lot Help from another person bathing (including washing, rinsing, drying)?: A Lot Help from another person to put on and taking off regular upper body clothing?: A Lot Help from another person to put on and taking off regular lower body clothing?: A Lot 6 Click Score: 14    End of Session    OT Visit Diagnosis: Unsteadiness on feet (R26.81);History of falling (Z91.81);Pain Pain - Right/Left: Left Pain - part of body: Shoulder   Activity Tolerance Patient tolerated treatment well   Patient Left in chair;with call bell/phone within reach   Nurse Communication          Time: 8502-7741 OT Time Calculation (min): 8 min  Charges: OT General Charges $OT Visit: 1 Visit OT Treatments $Therapeutic Exercise: 8-22 mins   Janice Coffin , COTA/L 03/03/2017, 9:08 AM

## 2017-03-03 NOTE — Progress Notes (Signed)
Subjective: 2 Days Post-Op Procedure(s) (LRB): REVERSE SHOULDER ARTHROPLASTY (Left) Patient reports pain as mild to left shoulder. Resting in bed. Tolerating Po's. Denies CP,SOb, or calf pain.   Objective: Vital signs in last 24 hours: Temp:  [98.3 F (36.8 C)-99.1 F (37.3 C)] 99.1 F (37.3 C) (11/10 0500) Pulse Rate:  [72-91] 91 (11/10 0500) Resp:  [18] 18 (11/10 0500) BP: (124-161)/(58-81) 161/75 (11/10 0500) SpO2:  [97 %] 97 % (11/10 0500)  Intake/Output from previous day: 11/09 0701 - 11/10 0700 In: 3110.1 [P.O.:1000; I.V.:2110.1] Out: -  Intake/Output this shift: No intake/output data recorded.  Recent Labs    03/01/17 1141  HGB 12.5   Recent Labs    03/01/17 1141  WBC 4.5  RBC 4.38  HCT 39.5  PLT 280   Recent Labs    03/01/17 1141  NA 137  K 3.9  CL 103  CO2 25  BUN 15  CREATININE 1.08*  GLUCOSE 129*  CALCIUM 9.1   No results for input(s): LABPT, INR in the last 72 hours.  Alert and oriented x3. RRR, Lungs clear, BS x4. Calf soft and non tender. Lane shoulder dressing C/D/I. No DVT signs. No signs of infection or compartment syndrome. LLE grossly neurovascularly intact.   Assessment/Plan: 2 Days Post-Op Procedure(s) (LRB): REVERSE SHOULDER ARTHROPLASTY (Left) Plan D/c to SNF tomorrow OT Sling in place Pain management  Cheryl Lane, Cheryl Lane 03/03/2017, 9:31 AM

## 2017-03-03 NOTE — NC FL2 (Signed)
[place] LEVEL OF CARE SCREENING TOOL     IDENTIFICATION  Patient Name: Cheryl Lane Birthdate: 04/21/1951 Sex: female Admission Date (Current Location): 03/01/2017  [facility] and [place] Number:  Herbalist and Address:  [facility], [place] [address] [place], [place] [address]      Provider Number: [id number]  Attending Physician Name and Address:  Justice Britain, MD  Relative Name and Phone Number:       Current Level of Care: Hospital Recommended Level of Care: [place] Prior Approval Number:    Date Approved/Denied:   PASRR Number: [id number]  Discharge Plan: SNF    Current Diagnoses: Patient Active Problem List   Diagnosis Date Noted  . 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 Genetic testing 02/06/2017  . Acute respiratory failure with hypoxia ([place]) 06/07/2016  . RSV (acute bronchiolitis due to respiratory syncytial virus) 06/07/2016  . Anxiety 06/07/2016  . Arthritis   . Depression 05/30/2016  . Chronic kidney disease (CKD), stage IV (severe) ([place]) 05/30/2016  . Mild intermittent asthma with acute exacerbation   . Respiratory distress   . CKD (chronic kidney disease) stage 3, GFR 30-59 ml/min (H[facility])   . Chronic pain syndrome   . Bronchitis 05/29/2016  . Left shoulder pain   . Opacity of lung on imaging study   . Shoulder pain, acute   . Weakness generalized 12/22/2015  . Essential hypertension 12/22/2015  . Pneumonia 12/22/2015  . PVC's (premature ventricular contractions) 12/22/2015  . CAP (community acquired pneumonia) 12/21/2015    Orientation RESPIRATION BLADDER Height & Weight     Self  Normal Continent Weight: 275 lb (124.7 kg) Height:  5\' 8"  (172.7 cm)  BEHAVIORAL SYMPTOMS/MOOD NEUROLOGICAL BOWEL NUTRITION STATUS      Continent Diet(regular)  AMBULATORY STATUS COMMUNICATION OF NEEDS Skin   Limited Assist Verbally Surgical wounds(left shoulder 03/01/17;  adhesive bandage dressing)                       Personal Care Assistance Level of Assistance  Bathing, Feeding, Dressing Bathing Assistance: Limited assistance Feeding assistance: Independent Dressing Assistance: Limited assistance     Functional Limitations Info  Sight, Hearing, Speech Sight Info: Adequate Hearing Info: Adequate Speech Info: Adequate    SPECIAL CARE FACTORS FREQUENCY  PT (By licensed PT), OT (By licensed OT)     PT Frequency: 5x/wk OT Frequency: 5x/wk            Contractures Contractures Info: Not present    Additional Factors Info  Code Status, Allergies, Psychotropic Code Status Info: full Allergies Info: NKA Psychotropic Info: Lexapro 10mg  daily         Current Medications (03/03/2017):  This is the current hospital active medication list Current Facility-Administered Medications  Medication Dose Route Frequency Provider Last Rate Last Dose  . acetaminophen (TYLENOL) tablet 650 mg  650 mg Oral Q4H PRN Shuford, Tracy, PA-C       Or  . acetaminophen (TYLENOL) suppository 650 mg  650 mg Rectal Q4H PRN Shuford, Tracy, PA-C      . bisacodyl (DULCOLAX) EC tablet 5 mg  5 mg Oral Daily PRN Shuford, Tracy, PA-C      . diazepam (VALIUM) tablet 2.5-5 mg  2.5-5 mg Oral Q6H PRN Shuford, Tracy, PA-C   5 mg at 03/02/17 2115  . diphenhydrAMINE (BENADRYL) 12.5 MG/5ML elixir 12.5-25 mg  12.5-25 mg Oral Q4H PRN Shuford, Olivia Mackie, PA-C      .  docusate sodium (COLACE) capsule 100 mg  100 mg Oral BID Shuford, Tracy, PA-C   100 mg at 03/03/17 0854  . escitalopram (LEXAPRO) tablet 10 mg  10 mg Oral Daily Shuford, Tracy, PA-C   10 mg at 03/03/17 0854  . HYDROcodone-acetaminophen (NORCO/VICODIN) 5-325 MG per tablet 1 tablet  1 tablet Oral Q4H PRN Shuford, Tracy, PA-C   1 tablet at 03/03/17 0854  . HYDROmorphone (DILAUDID) injection 1 mg  1 mg Intravenous Q2H PRN Shuford, Olivia Mackie, PA-C      . Influenza vac split quadrivalent PF (FLUZONE HIGH-DOSE) injection 0.5 mL  0.5  mL Intramuscular Tomorrow-1000 Justice Britain, MD      . lactated ringers infusion   Intravenous Continuous Duane Boston, MD   Stopped at 03/02/17 0154  . lactated ringers infusion   Intravenous Continuous Jenetta Loges, PA-C 75 mL/hr at 03/01/17 2147    . levothyroxine (SYNTHROID, LEVOTHROID) tablet 75 mcg  75 mcg Oral QAC breakfast Shuford, Olivia Mackie, PA-C   75 mcg at 03/03/17 0854  . lisinopril (PRINIVIL,ZESTRIL) tablet 20 mg  20 mg Oral QHS Shuford, Tracy, PA-C   20 mg at 03/02/17 2116  . loratadine (CLARITIN) tablet 10 mg  10 mg Oral Daily Shuford, Tracy, PA-C   10 mg at 03/03/17 0854  . magnesium citrate solution 1 Bottle  1 Bottle Oral Once PRN Shuford, Olivia Mackie, PA-C      . menthol-cetylpyridinium (CEPACOL) lozenge 3 mg  1 lozenge Oral PRN Shuford, Olivia Mackie, PA-C       Or  . phenol (CHLORASEPTIC) mouth spray 1 spray  1 spray Mouth/Throat PRN Shuford, Olivia Mackie, PA-C      . metoCLOPramide (REGLAN) tablet 5-10 mg  5-10 mg Oral Q8H PRN Shuford, Tracy, PA-C       Or  . metoCLOPramide (REGLAN) injection 5-10 mg  5-10 mg Intravenous Q8H PRN Shuford, Tracy, PA-C      . ondansetron (ZOFRAN) tablet 4 mg  4 mg Oral Q6H PRN Shuford, Tracy, PA-C       Or  . ondansetron (ZOFRAN) injection 4 mg  4 mg Intravenous Q6H PRN Shuford, Tracy, PA-C      . oxyCODONE (Oxy IR/ROXICODONE) immediate release tablet 10 mg  10 mg Oral Q3H PRN Shuford, Tracy, PA-C   10 mg at 03/02/17 2116  . polyethylene glycol (MIRALAX / GLYCOLAX) packet 17 g  17 g Oral Daily PRN Shuford, Olivia Mackie, PA-C         Discharge Medications: Please see discharge summary for a list of discharge medications.  Relevant Imaging Results:  Relevant Lab Results:   Additional Information SS#: [id number]  Geralynn Ochs, LCSW

## 2017-03-04 NOTE — Progress Notes (Signed)
Occupational Therapy Treatment Patient Details Name: Cheryl Lane MRN: [id number] DOB: [birth year] Today's Date: 03/04/2017    History of present illness 66 y.o. female presents with displaced left proximal humerus fracture after mechanical fall and underlying end stage arthritis; Pt now s/p reverse L TSA 03/01/17. Pertinent PMH includes: Cervical radiculopathy, Arthritis, Depression, HTN, Anemia, Asthma.   OT comments  Pt progressing towards established OT goals. Pt performing LUE exercises with VCs and Min A for elbow flexion/extenion. Pt requiring Max A for sling management. Provided education on UB dressing techniques and sling management; pt verbalizing understanding and will need further education/practice to increase safety. Continue to recommend dc to SNF for further OT to optimize safety and independence. Will continue to follow as admitted.   Follow Up Recommendations  DC plan and follow up therapy as arranged by surgeon;SNF    Equipment Recommendations  Other (comment)(defer to next venue )    Recommendations for Other Services      Precautions / Restrictions Precautions Precautions: Fall;Shoulder Type of Shoulder Precautions: Passive Protocol: NWB in UE; wear sling at all times except for ADL/exercise; No AROM of shoulder, no pendulums, may allow arm to dangle; okay for AROM to elbow, wrist, hand to tolerance; shoulder PROM for ADLs only (not for exercise): ER 0-10, ABD 0-45, FE 0-60 Shoulder Interventions: Off for dressing/bathing/exercises;Shoulder sling/immobilizer;At all times Required Braces or Orthoses: Sling Restrictions Weight Bearing Restrictions: Yes LUE Weight Bearing: Non weight bearing       Mobility Bed Mobility               General bed mobility comments: In recliner upon arrival  Transfers                      Balance Overall balance assessment: Needs assistance Sitting-balance support: Single extremity supported Sitting  balance-Leahy Scale: Good                                     ADL either performed or assessed with clinical judgement   ADL Overall ADL's : Needs assistance/impaired                 Upper Body Dressing : Maximal assistance;Sitting Upper Body Dressing Details (indicate cue type and reason): Educated pt on sling managment. Pt doffed sling with Mod A and donned with Max A. Provided education on UB dressing and proper clothing to increase independence. Pt verbalizing understanding of compensatory techiques and clothing choice; will need to practice.                   General ADL Comments: education provided on compensatory techniques for completing ADLs while adhering to shoulder precautions; pt completeing UE excercises     Vision       Perception     Praxis      Cognition Arousal/Alertness: Awake/alert Behavior During Therapy: WFL for tasks assessed/performed Overall Cognitive Status: Within Functional Limits for tasks assessed                                          Exercises Exercises: Shoulder;Hand exercises Shoulder Exercises Elbow Flexion: AAROM;AROM;Left;15 reps;Seated Elbow Extension: AAROM;AROM;Left;Seated;15 reps Wrist Flexion: AROM;Left;Seated;15 reps Wrist Extension: AROM;Left;Seated;15 reps Digit Composite Flexion: AROM;Left;Seated;15 reps Composite Extension: AROM;Left;Seated;15 reps Neck Flexion: AROM;5 reps;Seated Neck Extension: AROM;5  reps;Seated Neck Lateral Flexion - Right: AROM;5 reps;Seated Neck Lateral Flexion - Left: AROM;5 reps;Seated Hand Exercises Forearm Supination: AAROM;AROM;Left;Seated;15 reps Forearm Pronation: AROM;AAROM;Left;Seated;15 reps Thumb Abduction: AROM;Left;5 reps;Seated Thumb Adduction: AROM;Left;5 reps;Seated   Shoulder Instructions Shoulder Instructions Donning/doffing shirt without moving shoulder: Maximal assistance Donning/doffing sling/immobilizer: Maximal  assistance Correct positioning of sling/immobilizer: Maximal assistance ROM for elbow, wrist and digits of operated UE: Minimal assistance     General Comments      Pertinent Vitals/ Pain       Pain Assessment: Faces Faces Pain Scale: Hurts even more Pain Location: L shoulder Pain Descriptors / Indicators: Operative site guarding;Sharp;Grimacing Pain Intervention(s): Monitored during session;Limited activity within patient's tolerance;Repositioned  Home Living                                          Prior Functioning/Environment              Frequency  Min 2X/week        Progress Toward Goals  OT Goals(current goals can now be found in the care plan section)  Progress towards OT goals: Progressing toward goals  Acute Rehab OT Goals Patient Stated Goal: to go to rehab OT Goal Formulation: With patient Time For Goal Achievement: 03/16/17 Potential to Achieve Goals: Good ADL Goals Pt Will Perform Grooming: with set-up;sitting Pt Will Perform Upper Body Bathing: with min guard assist;sitting Pt Will Perform Upper Body Dressing: with min assist;sitting Pt Will Perform Lower Body Dressing: with min assist;sit to/from stand Pt Will Transfer to Toilet: with min assist;stand pivot transfer;bedside commode Pt Will Perform Toileting - Clothing Manipulation and hygiene: with min assist;sit to/from stand Pt/caregiver will Perform Home Exercise Program: Left upper extremity;With written HEP provided;With Supervision  Plan      Co-evaluation                 AM-PAC PT "6 Clicks" Daily Activity     Outcome Measure   Help from another person eating meals?: A Little Help from another person taking care of personal grooming?: A Little Help from another person toileting, which includes using toliet, bedpan, or urinal?: A Lot Help from another person bathing (including washing, rinsing, drying)?: A Lot Help from another person to put on and taking off  regular upper body clothing?: A Lot Help from another person to put on and taking off regular lower body clothing?: A Lot 6 Click Score: 14    End of Session Equipment Utilized During Treatment: Other (comment)(sling )  OT Visit Diagnosis: Unsteadiness on feet (R26.81);History of falling (Z91.81);Pain Pain - Right/Left: Left Pain - part of body: Shoulder   Activity Tolerance Patient tolerated treatment well   Patient Left in chair;with call bell/phone within reach   Nurse Communication Mobility status        Time: 5643-3295 OT Time Calculation (min): 19 min  Charges: OT General Charges $OT Visit: 1 Visit OT Treatments $Therapeutic Activity: 8-22 mins  [place], OTR/L Acute Rehab Pager: [phone] Office: [place] 03/04/2017, 11:56 AM

## 2017-03-04 NOTE — Clinical Social Work Note (Signed)
Clinical Social Work Assessment  Patient Details  Name: BRESHAY ILG MRN: [id number] Date of Birth: Sep 03, 1950  Date of referral:  03/04/17               Reason for consult:  Facility Placement                Permission sought to share information with:  Facility Sport and exercise psychologist, Family Supports Permission granted to share information::  Yes, Verbal Permission Granted  Name::     Abelahl,[name] Daughter [phone]  [phone] or Rodena Medin Relative   [phone]   Agency::  SNF admissions  Relationship::     Contact Information:     Housing/Transportation Living arrangements for the past 2 months:  Single Family Home Source of Information:  Patient Patient Interpreter Needed:  None Criminal Activity/Legal Involvement Pertinent to Current Situation/Hospitalization:  No - Comment as needed Significant Relationships:  Adult Children Lives with:  Adult Children Do you feel safe going back to the place where you live?  No Need for family participation in patient care:  Yes (Comment)(Patient requested to speak with her daughter.)  Care giving concerns:  Patient and family feel she needs some short term rehab before she is able to return back home.   Social Worker assessment / plan:  Patient is a 66 year old female who is alert and oriented x4.  Patient lives with her daughter.  Patient states she has been to rehab in the past, and is familiar with the process of looking for bed placements.  CSW explained how insurance will pay for snf rehab, patient asked how long she will be there, CSW informed her it will depend on how she does.  CSW explained to patient what the process is for looking for placement and role of CSW.  Patient is requesting [facility] because she has been there in the past and is familiar with the facility.  Patient gave CSW permission to begin be search in [facility].  Patient did not express any other questions or concerns.  Employment status:  Retired Forensic scientist:  Medicare PT Recommendations:  [place] / Referral to community resources:     Patient/Family's Response to care:  Patient is in agreement to going to SNF.                                                                                                                                                                                                                                                                                                                                  Patient/Family's Understanding of and Emotional Response to Diagnosis, Current Treatment, and Prognosis plan.  Patient is aware of current treatment plan and prognosis.                                     Emotional Assessment Appearance:  Appears stated age Attitude/Demeanor/Rapport:    Affect (typically observed):  Appropriate, Calm, Stable Orientation:  Oriented to Self, Oriented to Place, Oriented to  Time, Oriented to Situation Alcohol / Substance use:  Not Applicable Psych involvement (Current and /or in the community):  No (Comment)  Discharge Needs  Concerns to be addressed:  Lack of Support Readmission within the last 30 days:  No Current discharge risk:  Lack of support system Barriers to Discharge:  Continued Medical Work up   Ross Ludwig, [place] 03/04/2017, 5:00 PM

## 2017-03-04 NOTE — Clinical Social Work Note (Signed)
CSW spoke to patient and she is requesting [facility] for short term rehab.  CSW attempted to contact [facility] however no was available.  CSW left message on voice mail to have [facility] contact weekday social worker.  This CSW to update weekday unit social worker, with patient's choices.  Jones Broom. Norval Morton, MSW, [place]  03/04/2017 4:57 PM

## 2017-03-04 NOTE — Progress Notes (Signed)
   Subjective: 3 Days Post-Op Procedure(s) (LRB): REVERSE SHOULDER ARTHROPLASTY (Left)  Pt c/o moderate pain after surgery Pain with movement and therapy Denies any new symptoms or issues Patient reports pain as moderate.  Objective:   VITALS:   Vitals:   03/03/17 1943 03/04/17 0442  BP: (!) 146/65 126/81  Pulse: 79 73  Resp: 16 17  Temp: 99.1 F (37.3 C) 98.5 F (36.9 C)  SpO2: 97% 96%    Left shoulder incision healing well nv intact distally, slight decreased sensation with touch of the fingers of the left hand No rashes or edema dsitally  LABS No results for input(s): HGB, HCT, WBC, PLT in the last 72 hours.  No results for input(s): NA, K, BUN, CREATININE, GLUCOSE in the last 72 hours.   Assessment/Plan: 3 Days Post-Op Procedure(s) (LRB): REVERSE SHOULDER ARTHROPLASTY (Left) Plan for d/c to SNF tomorrow Continue PT/OT Pt in agreement    Merla Riches, MPAS, PA-C  03/04/2017, 11:56 AM

## 2017-03-05 DIAGNOSIS — S42296D Other nondisplaced fracture of upper end of unspecified humerus, subsequent encounter for fracture with routine healing: Secondary | ICD-10-CM | POA: Diagnosis not present

## 2017-03-05 DIAGNOSIS — J4540 Moderate persistent asthma, uncomplicated: Secondary | ICD-10-CM | POA: Diagnosis not present

## 2017-03-05 DIAGNOSIS — M1990 Unspecified osteoarthritis, unspecified site: Secondary | ICD-10-CM | POA: Diagnosis not present

## 2017-03-05 DIAGNOSIS — Z9181 History of falling: Secondary | ICD-10-CM | POA: Diagnosis not present

## 2017-03-05 DIAGNOSIS — S42295D Other nondisplaced fracture of upper end of left humerus, subsequent encounter for fracture with routine healing: Secondary | ICD-10-CM | POA: Diagnosis not present

## 2017-03-05 DIAGNOSIS — N184 Chronic kidney disease, stage 4 (severe): Secondary | ICD-10-CM | POA: Diagnosis not present

## 2017-03-05 DIAGNOSIS — F329 Major depressive disorder, single episode, unspecified: Secondary | ICD-10-CM | POA: Diagnosis not present

## 2017-03-05 DIAGNOSIS — E034 Atrophy of thyroid (acquired): Secondary | ICD-10-CM | POA: Diagnosis not present

## 2017-03-05 DIAGNOSIS — F4320 Adjustment disorder, unspecified: Secondary | ICD-10-CM | POA: Diagnosis not present

## 2017-03-05 DIAGNOSIS — Z23 Encounter for immunization: Secondary | ICD-10-CM | POA: Diagnosis not present

## 2017-03-05 DIAGNOSIS — M6281 Muscle weakness (generalized): Secondary | ICD-10-CM | POA: Diagnosis not present

## 2017-03-05 DIAGNOSIS — S6980XA Other specified injuries of unspecified wrist, hand and finger(s), initial encounter: Secondary | ICD-10-CM | POA: Diagnosis not present

## 2017-03-05 DIAGNOSIS — F419 Anxiety disorder, unspecified: Secondary | ICD-10-CM | POA: Diagnosis not present

## 2017-03-05 DIAGNOSIS — I129 Hypertensive chronic kidney disease with stage 1 through stage 4 chronic kidney disease, or unspecified chronic kidney disease: Secondary | ICD-10-CM | POA: Diagnosis not present

## 2017-03-05 DIAGNOSIS — R262 Difficulty in walking, not elsewhere classified: Secondary | ICD-10-CM | POA: Diagnosis not present

## 2017-03-05 DIAGNOSIS — W19XXXA Unspecified fall, initial encounter: Secondary | ICD-10-CM | POA: Diagnosis not present

## 2017-03-05 DIAGNOSIS — Z96612 Presence of left artificial shoulder joint: Secondary | ICD-10-CM | POA: Diagnosis not present

## 2017-03-05 DIAGNOSIS — I10 Essential (primary) hypertension: Secondary | ICD-10-CM | POA: Diagnosis not present

## 2017-03-05 DIAGNOSIS — Z471 Aftercare following joint replacement surgery: Secondary | ICD-10-CM | POA: Diagnosis not present

## 2017-03-05 DIAGNOSIS — D649 Anemia, unspecified: Secondary | ICD-10-CM | POA: Diagnosis not present

## 2017-03-05 DIAGNOSIS — R41841 Cognitive communication deficit: Secondary | ICD-10-CM | POA: Diagnosis not present

## 2017-03-05 DIAGNOSIS — S42292D Other displaced fracture of upper end of left humerus, subsequent encounter for fracture with routine healing: Secondary | ICD-10-CM | POA: Diagnosis not present

## 2017-03-05 DIAGNOSIS — G8911 Acute pain due to trauma: Secondary | ICD-10-CM | POA: Diagnosis not present

## 2017-03-05 DIAGNOSIS — E039 Hypothyroidism, unspecified: Secondary | ICD-10-CM | POA: Diagnosis not present

## 2017-03-05 NOTE — Clinical Social Work Placement (Signed)
   CLINICAL SOCIAL WORK PLACEMENT  NOTE  Date:  03/05/2017  Patient Details  Name: Cheryl Lane MRN: [id number] Date of Birth: 31-Aug-1950  Clinical Social Work is seeking post-discharge placement for this patient at the [place] level of care (*CSW will initial, date and re-position this form in  chart as items are completed):  Yes   Patient/family provided with [place] Work Department's list of facilities offering this level of care within the geographic area requested by the patient (or if unable, by the patient's family).  Yes   Patient/family informed of their freedom to choose among providers that offer the needed level of care, that participate in Medicare, Medicaid or managed care program needed by the patient, have an available bed and are willing to accept the patient.  Yes   Patient/family informed of [place]'s ownership interest in [facility] and [facility], as well as of the fact that they are under no obligation to receive care at these facilities.  PASRR submitted to EDS on 03/04/17     PASRR number received on       Existing PASRR number confirmed on 03/02/17     FL2 transmitted to all facilities in geographic area requested by pt/family on       FL2 transmitted to all facilities within larger geographic area on       Patient informed that his/her managed care company has contracts with or will negotiate with certain facilities, including the following:        Yes   Patient/family informed of bed offers received.  Patient chooses bed at [facility]     Physician recommends and patient chooses bed at      Patient to be transferred to [facility] on 03/05/17.  Patient to be transferred to facility by PTAR     Patient family notified on 03/05/17 of transfer.  Name of family member notified:  patient responsible for self     PHYSICIAN Please prepare prescriptions      Additional Comment:    _______________________________________________ Normajean Baxter, LCSW 03/05/2017, 12:50 PM

## 2017-03-05 NOTE — Progress Notes (Signed)
Pt is aware of transferring to [facility] and was giving updates on her dischg meds and informed that the snf will give her a plan of care when she arrives.  Family members have are in the room and have been updated. All belongings as well as her cane is packed for transfer. Prescriptions are being sent with patient's transfer info. Report called to nurse at [facility].  Awaiting PTAR arrival.

## 2017-03-05 NOTE — Care Management Note (Signed)
Case Management Note  Patient Details  Name: Cheryl Lane MRN: [id number] Date of Birth: [birth year]  Subjective/Objective:  Reverse shoulder arthroplasty                  Action/Plan: Discharge Planning: Chart reviewed. CSW following for SNF placement. Scheduled dc today to SNF rehab.   PCP  Nicoletta Dress MD  Expected Discharge Date:  03/05/2017                Expected Discharge Plan:  [place]  In-House Referral:  Clinical Social Work  Discharge planning Services  CM Consult  Post Acute Care Choice:  NA Choice offered to:  NA  DME Arranged:  N/A DME Agency:  NA  HH Arranged:  NA HH Agency:  NA  Status of Service:  Completed, signed off  If discussed at [place] of Stay Meetings, dates discussed:    Additional Comments:  Erenest Rasher, RN 03/05/2017, 12:29 PM

## 2017-03-05 NOTE — Care Management Important Message (Signed)
Important Message  Patient Details  Name: Cheryl Lane MRN: [id number] Date of Birth: 03-06-1951   Medicare Important Message Given:  Yes    Skyylar Kopf [address] 03/05/2017, 3:12 PM

## 2017-03-05 NOTE — Progress Notes (Signed)
Physical Therapy Treatment Patient Details Name: Cheryl Lane MRN: [id number] DOB: 1950-06-21 Today's Date: 03/05/2017    History of Present Illness 66 y.o. female presents with displaced left proximal humerus fracture after mechanical fall and underlying end stage arthritis; Pt now s/p reverse L TSA 03/01/17. Pertinent PMH includes: Cervical radiculopathy, Arthritis, Depression, HTN, Anemia, Asthma.    PT Comments    Patient is making progress toward mobility goals. Pt was able to ambulate 37ft with hemi walker and mod A for balance. Continue to progress as tolerated with anticipated d/c to SNF for further skilled PT services.     Follow Up Recommendations  SNF;DC plan and follow up therapy as arranged by surgeon     Equipment Recommendations  None recommended by PT    Recommendations for Other Services       Precautions / Restrictions Precautions Precautions: Fall;Shoulder Type of Shoulder Precautions: Passive Protocol: NWB in UE; wear sling at all times except for ADL/exercise; No AROM of shoulder, no pendulums, may allow arm to dangle; okay for AROM to elbow, wrist, hand to tolerance; shoulder PROM for ADLs only (not for exercise): ER 0-10, ABD 0-45, FE 0-60 Shoulder Interventions: Off for dressing/bathing/exercises;Shoulder sling/immobilizer;At all times Required Braces or Orthoses: Sling Restrictions Weight Bearing Restrictions: Yes LUE Weight Bearing: Non weight bearing    Mobility  Bed Mobility               General bed mobility comments: pt OOB in chair upon arrival  Transfers Overall transfer level: Needs assistance Equipment used: Hemi-walker Transfers: Sit to/from Stand Sit to Stand: Mod assist         General transfer comment: cues for safe hand placement and technique; assist to power up into standing and for balance upon standing  Ambulation/Gait Ambulation/Gait assistance: Mod assist Ambulation Distance (Feet): 50 Feet Assistive device:  Hemi-walker Gait Pattern/deviations: Decreased step length - right;Decreased step length - left;Trunk flexed Gait velocity: decreased   General Gait Details: cues for 3 point gait pattern and safe use of hemi walker; assist to steady    Stairs            Wheelchair Mobility    Modified Rankin (Stroke Patients Only)       Balance Overall balance assessment: Needs assistance Sitting-balance support: Single extremity supported Sitting balance-Leahy Scale: Good     Standing balance support: Single extremity supported Standing balance-Leahy Scale: Poor                              Cognition Arousal/Alertness: Awake/alert Behavior During Therapy: WFL for tasks assessed/performed;Anxious(fearful of falling) Overall Cognitive Status: Within Functional Limits for tasks assessed                                        Exercises      General Comments        Pertinent Vitals/Pain Pain Assessment: Faces Faces Pain Scale: Hurts even more Pain Location: L shoulder Pain Descriptors / Indicators: Operative site guarding;Grimacing;Sore Pain Intervention(s): Limited activity within patient's tolerance;Monitored during session;Premedicated before session;RN gave pain meds during session    Home Living                      Prior Function            PT Goals (current goals can  now be found in the care plan section) Acute Rehab PT Goals Patient Stated Goal: to go to rehab PT Goal Formulation: With patient/family Time For Goal Achievement: 03/16/17 Potential to Achieve Goals: Good Progress towards PT goals: Progressing toward goals    Frequency    Min 3X/week      PT Plan Current plan remains appropriate    Co-evaluation              AM-PAC PT "6 Clicks" Daily Activity  Outcome Measure  Difficulty turning over in bed (including adjusting bedclothes, sheets and blankets)?: Unable Difficulty moving from lying on back to  sitting on the side of the bed? : Unable Difficulty sitting down on and standing up from a chair with arms (e.g., wheelchair, bedside commode, etc,.)?: Unable Help needed moving to and from a bed to chair (including a wheelchair)?: A Lot Help needed walking in hospital room?: A Lot Help needed climbing 3-5 steps with a railing? : Total 6 Click Score: 8    End of Session Equipment Utilized During Treatment: Gait belt Activity Tolerance: Patient tolerated treatment well Patient left: in chair;with call bell/phone within reach Nurse Communication: Mobility status PT Visit Diagnosis: Unsteadiness on feet (R26.81);Other abnormalities of gait and mobility (R26.89);Repeated falls (R29.6);Muscle weakness (generalized) (M62.81);Pain Pain - Right/Left: Left Pain - part of body: Shoulder     Time: 9371-6967 PT Time Calculation (min) (ACUTE ONLY): 30 min  Charges:  $Gait Training: 8-22 mins $Therapeutic Activity: 8-22 mins                    G Codes:       Earney Navy, PTA Pager: [phone]     Darliss Cheney 03/05/2017, 12:18 PM

## 2017-03-05 NOTE — Discharge Summary (Signed)
PATIENT ID:      Cheryl Lane  MRN:     [id number] DOB/AGE:    68-Nov-1952 / 68 y.o.     DISCHARGE SUMMARY  ADMISSION DATE:    03/01/2017 DISCHARGE DATE:  03/05/17  ADMISSION DIAGNOSIS: Left proximal humerus fracture Past Medical History:  Diagnosis Date  . Anemia    as a child  . Arthritis   . Asthma    bronchitis  . Cervical radiculopathy   . Chronic pain in left shoulder   . CKD (chronic kidney disease)    pt not aware of this  . Depression   . Hypertension   . Hypothyroidism   . Pneumonia   . Pre-diabetes     DISCHARGE DIAGNOSIS:   Active Problems:   Status post reverse arthroplasty of left shoulder   PROCEDURE: Procedure(s): REVERSE SHOULDER ARTHROPLASTY on 03/01/2017  CONSULTS:    HISTORY:  See H&P in chart.  HOSPITAL COURSE:  Cheryl Lane is a 66 y.o. admitted on 03/01/2017 with a diagnosis of Left proximal humerus fracture.  They were brought to the operating room on 03/01/2017 and underwent Procedure(s): [place].    They were given perioperative antibiotics:  Anti-infectives (From admission, onward)   Start     Dose/Rate Route Frequency Ordered Stop   03/01/17 2130  ceFAZolin (ANCEF) IVPB 2g/100 mL premix     2 g 200 mL/hr over 30 Minutes Intravenous Every 6 hours 03/01/17 1947 03/02/17 1035   03/01/17 0600  ceFAZolin (ANCEF) 3 g in dextrose 5 % 50 mL IVPB     3 g 130 mL/hr over 30 Minutes Intravenous On call to O.R. 02/28/17 2563 03/01/17 1615    .  Patient underwent the above named procedure and tolerated it well. The following day they were hemodynamically stable and pain was controlled on oral analgesics. She was felt to require inpatient short term rehab after discharge so bed search was initiated. She was kept until a bed was found . They were neurovascularly intact to the operative extremity. OT/PT was ordered and worked with patient per protocol. They were medically and orthopaedically stable for discharge on todays date  03/05/17.    DIAGNOSTIC STUDIES:  RECENT RADIOGRAPHIC STUDIES :  Dg Cervical Spine Complete  Result Date: 02/24/2017 CLINICAL DATA:  Pt fell outside while at the salon today. Pt states she lost her balance. Neck pain. EXAM: CERVICAL SPINE - COMPLETE 4+ VIEW COMPARISON:  None. FINDINGS: No fracture or spondylolisthesis. Mild loss of disc height at C5-C6 and C6-C7 with endplate spurring. Mild neural foraminal narrowing from uncovertebral spurring at these levels. Soft tissues are unremarkable. IMPRESSION: No fracture, spondylolisthesis or acute finding. Electronically Signed   By: Lajean Manes M.D.   On: 02/24/2017 19:42   Dg Shoulder Left  Result Date: 02/24/2017 CLINICAL DATA:  Pt fell outside while at the salon today. Pt states she lost her balance. Left shoulder pain. EXAM: LEFT SHOULDER - 2+ VIEW COMPARISON:  None. FINDINGS: Fracture across the proximal humeral metaphysis which is not well-defined. The shaft component has migrated superiorly in relation to the humeral head component. No other fractures.  No dislocation. There is significant narrowing of the glenohumeral joint with prominent marginal osteophytes from base of the humeral head. AC joint is normally spaced and aligned. Bones are diffusely demineralized. IMPRESSION: 1. Although the fracture line is not defined, the configuration of the proximal humerus is consistent with a fracture of the metaphysis, with shaft impacting against the humeral head  component. 2. No dislocation. 3. Advanced glenohumeral joint arthropathic changes. Electronically Signed   By: Lajean Manes M.D.   On: 02/24/2017 19:45   Dg Knee Complete 4 Views Left  Result Date: 02/24/2017 CLINICAL DATA:  Pt fell outside while at the salon today. Pt states she lost her balance. Left knee pain. EXAM: LEFT KNEE - COMPLETE 4+ VIEW COMPARISON:  None. FINDINGS: No fracture.  No bone lesion. There is lateral and patellofemoral joint space compartment narrowing associated with  marginal osteophytes. Subtle chondrocalcinosis noted along the menisci. Bones are demineralized. No convincing joint effusion. Surrounding soft tissues are unremarkable. IMPRESSION: 1. No fracture, dislocation or acute finding. 2. Arthropathic changes of the left knee.  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 Electronically Signed   By: Lajean Manes M.D.   On: 02/24/2017 19:41   Dg Humerus Left  Result Date: 02/24/2017 CLINICAL DATA:  Pt fell outside while at the salon today. Pt states she lost her balance EXAM: LEFT HUMERUS - 2+ VIEW COMPARISON:  None. FINDINGS: Evidence of a fracture of the proximal humeral metaphysis as defined on the left shoulder radiographs. Fracture line is better seen on the humeral radiographs than the shoulder radiographs. No other fractures.  No dislocation.  The bones are demineralized. IMPRESSION: Fracture across the proximal metaphysis of the left humerus with the shaft impacting against the humeral head component. No other fractures. No dislocation. Electronically Signed   By: Lajean Manes M.D.   On: 02/24/2017 19:46    RECENT VITAL SIGNS:   Patient Vitals for the past 24 hrs:  BP Temp Temp src Pulse Resp SpO2  03/05/17 0531 121/63 98.2 F (36.8 C) Oral 68 17 99 %  03/04/17 2039 (!) 111/58 98.4 F (36.9 C) Oral 90 15 93 %  03/04/17 1622 (!) 101/57 98.9 F (37.2 C) Oral 76 16 95 %  .  RECENT EKG RESULTS:    Orders placed or performed during the hospital encounter of 05/29/16  . EKG 12-Lead  . EKG 12-Lead  . EKG 12-Lead  . EKG 12-Lead  . EKG    DISCHARGE INSTRUCTIONS:    DISCHARGE MEDICATIONS:   Allergies as of 03/05/2017   No Known Allergies     Medication List    TAKE these medications   albuterol 108 (90 Base) MCG/ACT inhaler Commonly known as:  PROVENTIL HFA;VENTOLIN HFA Inhale 2 puffs into the lungs every 4 (four) hours as needed for wheezing or shortness of breath.   albuterol (2.5 MG/3ML) 0.083% nebulizer solution Commonly known as:   PROVENTIL Take 3 mLs (2.5 mg total) by nebulization every 4 (four) hours as needed for wheezing or shortness of breath.   ALPRAZolam 0.25 MG tablet Commonly known as:  XANAX Take 1 tablet (0.25 mg total) by mouth 3 (three) times daily as needed for anxiety.   amLODipine 10 MG tablet Commonly known as:  NORVASC Take 1 tablet (10 mg total) by mouth daily.   benzonatate 200 MG capsule Commonly known as:  TESSALON Take 1 capsule (200 mg total) by mouth 3 (three) times daily as needed for cough.   diazepam 5 MG tablet Commonly known as:  VALIUM Take 0.5-1 tablets (2.5-5 mg total) every 6 (six) hours as needed by mouth for anxiety, muscle spasms or sedation.   escitalopram 10 MG tablet Commonly known as:  LEXAPRO Take 10 mg daily by mouth.   fluticasone 50 MCG/ACT nasal spray Commonly known as:  FLONASE Place 2 sprays into both nostrils daily.   guaiFENesin 600 MG  12 hr tablet Commonly known as:  MUCINEX Take 1 tablet (600 mg total) by mouth 2 (two) times daily.   ipratropium-albuterol 0.5-2.5 (3) MG/3ML Soln Commonly known as:  DUONEB Take 3 mLs by nebulization every 8 (eight) hours.   levothyroxine 75 MCG tablet Commonly known as:  SYNTHROID, LEVOTHROID Take 75 mcg daily before breakfast by mouth.   lisinopril 10 MG tablet Commonly known as:  PRINIVIL,ZESTRIL Take 2 tablets (20 mg total) by mouth daily. What changed:  when to take this   loratadine 10 MG tablet Commonly known as:  CLARITIN Take 1 tablet (10 mg total) by mouth daily.   meloxicam 15 MG tablet Commonly known as:  MOBIC Take 15 mg daily by mouth.   oxyCODONE-acetaminophen 5-325 MG tablet Commonly known as:  PERCOCET/ROXICET Take 1 tablet every 4 (four) hours as needed by mouth for severe pain. What changed:  how much to take   traMADol 50 MG tablet Commonly known as:  ULTRAM Take 1 tablet (50 mg total) by mouth every 6 (six) hours as needed for moderate pain.       FOLLOW UP VISIT:    Contact  information for follow-up providers    Justice Britain, MD.   Specialty:  Orthopedic Surgery Why:  call to be seen in 10-14 days Contact information: [address] [place] [address] [phone]            Contact information for after-discharge care    Destination    [facility] .   Service:  Skilled Nursing Contact information: [address] [place] [phone]                  DISCHARGE TO: Skilled   DISCHARGE CONDITION:  Good   Leave current dressing on left shoulder until follow up appt  OK to shower  OK to allow arm to dangle and move elbow wrist and hand , no pendulums.  CONT PT/OT for gait training.  NWB and sling on left UPPER extremity   Anusha Claus for Dr. Justice Britain 03/05/2017, 12:42 PM

## 2017-03-05 NOTE — Social Work (Addendum)
Clinical Social Worker facilitated patient discharge including contacting patient family and facility to confirm patient discharge plans.  Clinical information faxed to facility and family agreeable with plan.    CSW arranged ambulance transport via [facility] to [facility].    RN to call [phone] to give  report prior to discharge.  Clinical Social Worker will sign off for now as social work intervention is no longer needed. Please consult [place] again if new need arises.  Elissa Hefty, LCSW Clinical Social Worker [phone]

## 2017-03-05 NOTE — Progress Notes (Signed)
Occupational Therapy Treatment Patient Details Name: Cheryl Lane MRN: [id number] DOB: [birth year] Today's Date: 03/05/2017    History of present illness 66 y.o. female presents with displaced left proximal humerus fracture after mechanical fall and underlying end stage arthritis; Pt now s/p reverse L TSA 03/01/17. Pertinent PMH includes: Cervical radiculopathy, Arthritis, Depression, HTN, Anemia, Asthma.   OT comments  Pt progressing towards goals, completing stand pivot to/from [facility] during this session with overall ModA. Reviewed elbow,wrist, and hand ROM with Pt return demonstrating with A/AAROM and min verbal cues. Pt continues to require increased assist for standing balance and MaxA for UB ADLs. Feel SNF remains appropriate recommendation at this time. Will continue to follow acutely to progress Pt toward's established OT goals.    Follow Up Recommendations  DC plan and follow up therapy as arranged by surgeon;SNF    Equipment Recommendations  Other (comment)(defer to next venue )          Precautions / Restrictions Precautions Precautions: Fall;Shoulder Type of Shoulder Precautions: Passive Protocol: NWB in UE; wear sling at all times except for ADL/exercise; No AROM of shoulder, no pendulums, may allow arm to dangle; okay for AROM to elbow, wrist, hand to tolerance; shoulder PROM for ADLs only (not for exercise): ER 0-10, ABD 0-45, FE 0-60 Shoulder Interventions: Off for dressing/bathing/exercises;Shoulder sling/immobilizer;At all times Precaution Comments: reviewed with Pt  Required Braces or Orthoses: Sling Restrictions Weight Bearing Restrictions: Yes LUE Weight Bearing: Non weight bearing       Mobility Bed Mobility               General bed mobility comments: pt OOB in chair upon arrival  Transfers Overall transfer level: Needs assistance Equipment used: 1 person hand held assist Transfers: Sit to/from [employer] Transfers Sit to Stand: Mod  assist Stand pivot transfers: Mod assist       General transfer comment: increased time and effort to boost into standing (requires x2-3 attempts to reach full standing); ModA for steadying as Pt takes small pivotal steps to transfer recliner<>BSC    Balance Overall balance assessment: Needs assistance Sitting-balance support: Single extremity supported Sitting balance-Leahy Scale: Good     Standing balance support: Single extremity supported Standing balance-Leahy Scale: Poor Standing balance comment: reliance on BUE support at baseline. Pt requires external HHA to maintain static standing                            ADL either performed or assessed with clinical judgement   ADL Overall ADL's : Needs assistance/impaired                 Upper Body Dressing : Maximal assistance;Sitting Upper Body Dressing Details (indicate cue type and reason): donning/doffing sling      Toilet Transfer: Moderate assistance;Stand-pivot;BSC Toilet Transfer Details (indicate cue type and reason): Pt completed stand pivot to [facility] but was not able to void bladder at this time          Functional mobility during ADLs: Moderate assistance(for stand pivot ) General ADL Comments: Pt requires increased encouragement for mobility as she continues to become increasingly anxious; reviewed elbow/forearm/wrist ROM ROM                       Cognition Arousal/Alertness: Awake/alert Behavior During Therapy: WFL for tasks assessed/performed;Anxious(fearful of falling ) Overall Cognitive Status: Within Functional Limits for tasks assessed  Exercises Shoulder Exercises Elbow Flexion: AAROM;AROM;Left;Seated;10 reps Elbow Extension: AAROM;AROM;Left;Seated;10 reps Wrist Flexion: AROM;Left;Seated;15 reps Wrist Extension: AROM;Left;Seated;15 reps Digit Composite Flexion: AROM;Left;Seated;15 reps Composite Extension:  AROM;Left;Seated;15 reps Hand Exercises Forearm Supination: AAROM;AROM;Left;Seated;10 reps Forearm Pronation: AROM;AAROM;Left;Seated;10 reps   Shoulder Instructions Shoulder Instructions Donning/doffing sling/immobilizer: Maximal assistance Correct positioning of sling/immobilizer: Maximal assistance ROM for elbow, wrist and digits of operated UE: Minimal assistance Sling wearing schedule (on at all times/off for ADL's): Minimal assistance           Pertinent Vitals/ Pain       Pain Assessment: Faces Faces Pain Scale: Hurts even more Pain Location: L shoulder Pain Descriptors / Indicators: Operative site guarding;Grimacing;Sore Pain Intervention(s): Limited activity within patient's tolerance;Monitored during session                                                          Frequency  Min 2X/week        Progress Toward Goals  OT Goals(current goals can now be found in the care plan section)  Progress towards OT goals: Progressing toward goals  Acute Rehab OT Goals Patient Stated Goal: to go to rehab OT Goal Formulation: With patient Time For Goal Achievement: 03/16/17 Potential to Achieve Goals: Good  Plan Discharge plan remains appropriate                     AM-PAC PT "6 Clicks" Daily Activity     Outcome Measure   Help from another person eating meals?: A Little Help from another person taking care of personal grooming?: A Little Help from another person toileting, which includes using toliet, bedpan, or urinal?: A Lot Help from another person bathing (including washing, rinsing, drying)?: A Lot Help from another person to put on and taking off regular upper body clothing?: A Lot Help from another person to put on and taking off regular lower body clothing?: A Lot 6 Click Score: 14    End of Session Equipment Utilized During Treatment: Other (comment)(sling)  OT Visit Diagnosis: Unsteadiness on feet (R26.81);History of  falling (Z91.81);Pain Pain - Right/Left: Left Pain - part of body: Shoulder   Activity Tolerance Patient tolerated treatment well   Patient Left in chair;with call bell/phone within reach   Nurse Communication Mobility status        Time: 6301-6010 OT Time Calculation (min): 27 min  Charges: OT General Charges $OT Visit: 1 Visit OT Treatments $Self Care/Home Management : 8-22 mins $Therapeutic Activity: 8-22 mins  Lou Cal, OT Pager [phone] 03/05/2017    Raymondo Band 03/05/2017, 4:59 PM

## 2017-03-05 NOTE — Social Work (Signed)
CSW met with patient at bedside confirmed bed acceptance at [facility].  CSW called SNF and confirmed offer.  CSW will assist with disposition to SNF.  Elissa Hefty, LCSW Clinical Social Worker [phone]

## 2017-03-05 NOTE — Care Management Important Message (Signed)
Important Message  Patient Details  Name: Cheryl Lane MRN: [id number] Date of Birth: 1950/05/30   Medicare Important Message Given:  Yes    Erenest Rasher, RN 03/05/2017, 12:28 PM

## 2017-03-06 ENCOUNTER — Encounter: Payer: Self-pay | Admitting: Internal Medicine

## 2017-03-06 ENCOUNTER — Non-Acute Institutional Stay (SKILLED_NURSING_FACILITY): Payer: Medicare Other | Admitting: Internal Medicine

## 2017-03-06 DIAGNOSIS — F329 Major depressive disorder, single episode, unspecified: Secondary | ICD-10-CM | POA: Diagnosis not present

## 2017-03-06 DIAGNOSIS — Z96612 Presence of left artificial shoulder joint: Secondary | ICD-10-CM

## 2017-03-06 DIAGNOSIS — J4540 Moderate persistent asthma, uncomplicated: Secondary | ICD-10-CM

## 2017-03-06 DIAGNOSIS — F419 Anxiety disorder, unspecified: Secondary | ICD-10-CM

## 2017-03-06 DIAGNOSIS — I10 Essential (primary) hypertension: Secondary | ICD-10-CM

## 2017-03-06 DIAGNOSIS — F32A Depression, unspecified: Secondary | ICD-10-CM

## 2017-03-06 DIAGNOSIS — E034 Atrophy of thyroid (acquired): Secondary | ICD-10-CM

## 2017-03-06 DIAGNOSIS — S42295D Other nondisplaced fracture of upper end of left humerus, subsequent encounter for fracture with routine healing: Secondary | ICD-10-CM

## 2017-03-06 NOTE — Progress Notes (Signed)
: Provider:  Noah Delaine. Sheppard Coil, MD Location:  [facility] Room Number: 103 Place of Service:  SNF [phone])  PCP: Nicoletta Dress, MD Patient Care Team: Nicoletta Dress, MD as PCP - General (Internal Medicine) Justice Britain, MD as Consulting Physician (Orthopedic Surgery)  Extended Emergency Contact Information Primary Emergency Contact: Cynda Acres [place] of [place] Phone: [phone] Mobile Phone: [phone] Relation: Daughter Secondary Emergency Contact: Buck Mam [place] of [place] Mobile Phone: [phone] Relation: Relative     Allergies: Patient has no known allergies.  Chief Complaint  Patient presents with  . New Admit To SNF    following hospitalization 03/01/17 to 03/05/17 left proximal humerus fracture after falling 02/24/17. Reverse shoulder arthroplasty 03/01/17 Dr. Justice Britain    HPI: Patient is 66 y.o. female with hypertension, hypothyroidism, anxiety, who presented to [facility] 02/24/2017 where she reports she was walking out of the blue salon and slipped and fell. Patient had complaints of left shoulder  Pain, pain  left side of neck and of her left knee. She denies hitting her head or losing consciousness. X-ray showed fracture of left humerus. Patient was admitted [facility] from [facility]12 for a reverse shoulder arthroplasty which was on 11/8. There were no apparent comp patients and patient was admitted to skilled nursing facility for OT/PT. While at skilled nursing facility patient will be followed for hypertension treated with Norvasc and lisinopril, hypothyroidism treated with replacement, and anxiety treated with Xanax.  Past Medical History:  Diagnosis Date  . Acute respiratory failure with hypoxia ([place]) 06/07/2016  . Anemia    as a child  . Anxiety 06/07/2016  . Arthritis   . Asthma    bronchitis  . CAP (community acquired pneumonia) 12/21/2015  . Cervical radiculopathy   .  Chronic kidney disease (CKD), stage IV (severe) ([place]) 05/30/2016  . Chronic pain in left shoulder   . CKD (chronic kidney disease)    pt not aware of this  . CKD (chronic kidney disease) stage 3, GFR 30-59 ml/min (HCC)   . Depression   . Hypertension   . Hypothyroidism   . Pneumonia   . Pre-diabetes   . PVC's (premature ventricular contractions) 12/22/2015  . 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 APPENDECTOMY    . TUBAL LIGATION      Allergies as of 03/06/2017   No Known Allergies     Medication List        Accurate as of 03/06/17 12:21 PM. Always use your most recent med list.          albuterol 108 (90 Base) MCG/ACT inhaler Commonly known as:  PROVENTIL HFA;VENTOLIN HFA Inhale 2 puffs into the lungs every 4 (four) hours as needed for wheezing or shortness of breath.   albuterol (2.5 MG/3ML) 0.083% nebulizer solution Commonly known as:  PROVENTIL Take 3 mLs (2.5 mg total) by nebulization every 4 (four) hours as needed for wheezing or shortness of breath.   ALPRAZolam 0.25 MG tablet Commonly known as:  XANAX Take 1 tablet (0.25 mg total) by mouth 3 (three) times daily as needed for anxiety.   amLODipine 10 MG tablet Commonly known as:  NORVASC Take 1 tablet (10 mg total) by mouth daily.   benzonatate 200 MG capsule Commonly known as:  TESSALON Take 1 capsule (200 mg total) by mouth 3 (three) times daily as needed for  cough.   escitalopram 10 MG tablet Commonly known as:  LEXAPRO Take 10 mg daily by mouth.   fluticasone 50 MCG/ACT nasal spray Commonly known as:  FLONASE Place 2 sprays into both nostrils daily.   guaiFENesin 600 MG 12 hr tablet Commonly known as:  MUCINEX Take 1 tablet (600 mg total) by mouth 2 (two) times daily.   ipratropium-albuterol 0.5-2.5 (3) MG/3ML Soln Commonly known as:  DUONEB Take 3 mLs by nebulization every 8 (eight) hours.   levothyroxine 75 MCG  tablet Commonly known as:  SYNTHROID, LEVOTHROID Take 75 mcg daily before breakfast by mouth.   lisinopril 10 MG tablet Commonly known as:  PRINIVIL,ZESTRIL Take 2 tablets (20 mg total) by mouth daily.   loratadine 10 MG tablet Commonly known as:  CLARITIN Take 1 tablet (10 mg total) by mouth daily.   meloxicam 15 MG tablet Commonly known as:  MOBIC Take 15 mg daily by mouth.   oxyCODONE-acetaminophen 5-325 MG tablet Commonly known as:  PERCOCET/ROXICET Take by mouth. Take one tablet every 6 hours as needed for severe pain   traMADol 50 MG tablet Commonly known as:  ULTRAM Take 1 tablet (50 mg total) by mouth every 6 (six) hours as needed for moderate pain.       Meds ordered this encounter  Medications  . oxyCODONE-acetaminophen (PERCOCET/ROXICET) 5-325 MG tablet    Sig: Take by mouth. Take one tablet every 6 hours as needed for severe pain    Immunization History  Administered Date(s) Administered  . Influenza, High Dose Seasonal PF 03/04/2017  . Influenza-Unspecified 02/29/2016  . PPD Test 06/05/2016    Social History   Tobacco Use  . Smoking status: Never Smoker  . Smokeless tobacco: Never Used  Substance Use Topics  . Alcohol use: No    Family history is   Family History  Problem Relation Age of Onset  . Hypertension Other   . Breast cancer Mother 55  . Breast cancer Sister 23  . Lung cancer Brother   . Cancer Sister 71       "female" cancer  . Cancer Sister        unknown cancer  . Breast cancer Sister 15      Review of Systems  DATA OBTAINED: from patient, nurse GENERAL:  no fevers, fatigue, appetite changes SKIN: No itching, or rash EYES: No eye pain, redness, discharge EARS: No earache, tinnitus, change in hearing NOSE: No congestion, drainage or bleeding  MOUTH/THROAT: No mouth or tooth pain, No sore throat RESPIRATORY: No cough, wheezing, SOB CARDIAC: No chest pain, palpitations, lower extremity edema  GI: No abdominal pain, No  N/V/D or constipation, No heartburn or reflux  GU: No dysuria, frequency or urgency, or incontinence  MUSCULOSKELETAL: No unrelieved bone/joint pain NEUROLOGIC: No headache, dizziness or focal weakness PSYCHIATRIC: No c/o anxiety or sadness   Vitals:   03/06/17 1200  BP: 128/66  Pulse: 87  Resp: 20  Temp: 98.2 F (36.8 C)    SpO2 Readings from Last 1 Encounters:  03/05/17 99%   Body mass index is 41.81 kg/m.     Physical Exam  GENERAL APPEARANCE: Alert, conversant,  No acute distress.  SKIN: No diaphoresis rash HEAD: Normocephalic, atraumatic  EYES: Conjunctiva/lids clear. Pupils round, reactive. EOMs intact.  EARS: External exam WNL, canals clear. Hearing grossly normal.  NOSE: No deformity or discharge.  MOUTH/THROAT: Lips w/o lesions  RESPIRATORY: Breathing is even, unlabored. Lung sounds are clear   CARDIOVASCULAR: Heart RRR no murmurs,  rubs or gallops. No peripheral edema.   GASTROINTESTINAL: Abdomen is soft, non-tender, not distended w/ normal bowel sounds. GENITOURINARY: Bladder non tender, not distended  MUSCULOSKELETAL: Left arm sling NEUROLOGIC:  Cranial nerves 2-12 grossly intact. Moves all extremities  PSYCHIATRIC: Mood and affect appropriate to situation, no behavioral issues  Patient Active Problem List   Diagnosis Date Noted  . 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 Genetic testing 02/06/2017  . Acute respiratory failure with hypoxia ([place]) 06/07/2016  . RSV (acute bronchiolitis due to respiratory syncytial virus) 06/07/2016  . Anxiety 06/07/2016  . Arthritis   . Depression 05/30/2016  . Chronic kidney disease (CKD), stage IV (severe) ([place]) 05/30/2016  . Mild intermittent asthma with acute exacerbation   . Respiratory distress   . CKD (chronic kidney disease) stage 3, GFR 30-59 ml/min (HCC)   . Chronic pain syndrome   . Bronchitis 05/29/2016  . Left shoulder pain   . Opacity of lung on imaging study   . Shoulder pain,  acute   . Weakness generalized 12/22/2015  . Essential hypertension 12/22/2015  . Pneumonia 12/22/2015  . PVC's (premature ventricular contractions) 12/22/2015  . CAP (community acquired pneumonia) 12/21/2015      Labs reviewed: Basic Metabolic Panel:    Component Value Date/Time   NA 137 03/01/2017 1141   NA 140 06/10/2016   K 3.9 03/01/2017 1141   CL 103 03/01/2017 1141   CO2 25 03/01/2017 1141   GLUCOSE 129 (H) 03/01/2017 1141   BUN 15 03/01/2017 1141   BUN 21 06/10/2016   CREATININE 1.08 (H) 03/01/2017 1141   CALCIUM 9.1 03/01/2017 1141   PROT 6.5 05/31/2016 0537   ALBUMIN 3.2 (L) 05/31/2016 0537   AST 30 05/31/2016 0537   ALT 13 (L) 05/31/2016 0537   ALKPHOS 60 05/31/2016 0537   BILITOT 0.5 05/31/2016 0537   GFRNONAA 52 (L) 03/01/2017 1141   GFRAA 60 (L) 03/01/2017 1141    Recent Labs    05/29/16 1934  05/31/16 0537 06/09/16 06/10/16 03/01/17 1141  NA 138   < > 139 142 140 137  K 3.4*  --  3.8 4.1 4.2 3.9  CL 102  --  102  --   --  103  CO2 26  --  27  --   --  25  GLUCOSE 113*  --  115*  --   --  129*  BUN 15   < > 19 23* 21 15  CREATININE 1.22*   < > 1.12* 1.0 1.0 1.08*  CALCIUM 8.8*  --  8.4*  --   --  9.1   < > = values in this interval not displayed.   Liver Function Tests: Recent Labs    05/29/16 05/29/16 1934 05/31/16 0537  AST 22 22 30   ALT 10 10* 13*  ALKPHOS 65 65 60  BILITOT  --  0.8 0.5  PROT  --  7.7 6.5  ALBUMIN  --  3.8 3.2*   No results for input(s): LIPASE, AMYLASE in the last 8760 hours. No results for input(s): AMMONIA in the last 8760 hours. CBC: Recent Labs    05/29/16 1934  05/31/16 0537 06/09/16 06/10/16 03/01/17 1141  WBC 3.9*   < > 5.5 6.5 7.1 4.5  NEUTROABS 1.6*  --   --   --   --   --   HGB 13.4  --  11.7* 13.5 13.9 12.5  HCT 41.3  --  37.6 44 43 39.5  MCV 87.5  --  89.1  --   --  90.2  PLT 251  --  225 297 280 280   < > = values in this interval not displayed.   Lipid No results for input(s): CHOL, HDL,  LDLCALC, TRIG in the last 8760 hours.  Cardiac Enzymes: Recent Labs    05/29/16 1934  TROPONINI <0.03   BNP: No results for input(s): BNP in the last 8760 hours. No results found for: MICROALBUR No results found for: HGBA1C Lab Results  Component Value Date   TSH 11.446 (H) 12/22/2015   No results found for: VITAMINB12 No results found for: FOLATE No results found for: IRON, TIBC, FERRITIN  Imaging and Procedures obtained prior to SNF admission: No results found.   Not all labs, radiology exams or other studies done during hospitalization come through on my EPIC note; however they are reviewed by me.    Assessment and Plan  FRACTURE LEFT HUMERUS/STATUS post REVERSE ARTHROPLASTY LEFT SHOULDER-no complications, patient did some rehabilitation while in hospital SNF - admitted for OT/PT  HYPERTENSION SNF - controlled; continue Norvasc 10 mg by mouth daily and lisinopril 20 mg by mouth daily  HYPOTHYROIDISM SNF -not stated as uncontrolled; plan to continue replacement with levothyroxine 75 g by mouth daily  COPD-not an exacerbation SNF - stable; continue routine meds of DuoNeb every 8 hours with when necessary Tessalon Perles and albuterol neb  DEPRESSION SNF - stable; continue Lexapro 10 mg by mouth daily  ANXIETY SNF - will continue when necessary Xanax 0.25 mg 3 times a day for no longer than 14 days.   Time spent greater than 35 minutes;> 50% of time with patient was spent reviewing records, labs, tests and studies, counseling and developing plan of care Noah Delaine. Sheppard Coil, MD

## 2017-03-09 ENCOUNTER — Encounter: Payer: Self-pay | Admitting: Internal Medicine

## 2017-03-09 DIAGNOSIS — S42309A Unspecified fracture of shaft of humerus, unspecified arm, initial encounter for closed fracture: Secondary | ICD-10-CM | POA: Insufficient documentation

## 2017-03-09 DIAGNOSIS — E039 Hypothyroidism, unspecified: Secondary | ICD-10-CM | POA: Insufficient documentation

## 2017-03-09 DIAGNOSIS — J45909 Unspecified asthma, uncomplicated: Secondary | ICD-10-CM | POA: Insufficient documentation

## 2017-03-12 LAB — CBC AND DIFFERENTIAL
HEMATOCRIT: 32 — AB (ref 36–46)
Hemoglobin: 10.1 — AB (ref 12.0–16.0)
PLATELETS: 385 (ref 150–399)
WBC: 6.3

## 2017-03-12 LAB — BASIC METABOLIC PANEL
BUN: 22 — AB (ref 4–21)
Creatinine: 1 (ref 0.5–1.1)
Glucose: 139
Potassium: 4.8 (ref 3.4–5.3)
SODIUM: 138 (ref 137–147)

## 2017-03-14 DIAGNOSIS — Z471 Aftercare following joint replacement surgery: Secondary | ICD-10-CM | POA: Diagnosis not present

## 2017-03-14 DIAGNOSIS — Z96612 Presence of left artificial shoulder joint: Secondary | ICD-10-CM | POA: Diagnosis not present

## 2017-03-14 DIAGNOSIS — S42292D Other displaced fracture of upper end of left humerus, subsequent encounter for fracture with routine healing: Secondary | ICD-10-CM | POA: Diagnosis not present

## 2017-03-22 ENCOUNTER — Non-Acute Institutional Stay (SKILLED_NURSING_FACILITY): Payer: Medicare Other | Admitting: Internal Medicine

## 2017-03-22 ENCOUNTER — Encounter: Payer: Self-pay | Admitting: Internal Medicine

## 2017-03-22 DIAGNOSIS — F419 Anxiety disorder, unspecified: Secondary | ICD-10-CM | POA: Diagnosis not present

## 2017-03-22 DIAGNOSIS — I10 Essential (primary) hypertension: Secondary | ICD-10-CM

## 2017-03-22 DIAGNOSIS — J4540 Moderate persistent asthma, uncomplicated: Secondary | ICD-10-CM | POA: Diagnosis not present

## 2017-03-22 DIAGNOSIS — E034 Atrophy of thyroid (acquired): Secondary | ICD-10-CM | POA: Diagnosis not present

## 2017-03-22 DIAGNOSIS — F329 Major depressive disorder, single episode, unspecified: Secondary | ICD-10-CM

## 2017-03-22 DIAGNOSIS — Z96612 Presence of left artificial shoulder joint: Secondary | ICD-10-CM

## 2017-03-22 DIAGNOSIS — S42296D Other nondisplaced fracture of upper end of unspecified humerus, subsequent encounter for fracture with routine healing: Secondary | ICD-10-CM | POA: Diagnosis not present

## 2017-03-22 DIAGNOSIS — F32A Depression, unspecified: Secondary | ICD-10-CM

## 2017-03-22 NOTE — Progress Notes (Signed)
Location:  [facility] Room Number: [place]:  SNF [phone])  Provider: Noah Delaine. Sheppard Coil, MD  PCP: Nicoletta Dress, MD Patient Care Team: Nicoletta Dress, MD as PCP - General (Internal Medicine) Justice Britain, MD as Consulting Physician (Orthopedic Surgery)  Extended Emergency Contact Information Primary Emergency Contact: Cynda Acres [place] of [place] Phone: [phone] Mobile Phone: [phone] Relation: Daughter Secondary Emergency Contact: Buck Mam [place] of [place] Mobile Phone: [phone] Relation: Relative  No Known Allergies  Chief Complaint  Patient presents with  . Discharge Note    discharge from SNF to home    HPI:  66 y.o. female  with hypertension, hypothyroidism, anxiety, who was admitted to [facility] from 11/8[facility]12 after a fall for a reverse shoulder arthroplasty for a fracture of her left humerus. Patient was admitted to skilled nursing facility for OT and PT and patient is now ready to be discharged home.    Past Medical History:  Diagnosis Date  . Acute respiratory failure with hypoxia ([place]) 06/07/2016  . Anemia    as a child  . Anxiety 06/07/2016  . Arthritis   . Asthma    bronchitis  . CAP (community acquired pneumonia) 12/21/2015  . Cervical radiculopathy   . Chronic kidney disease (CKD), stage IV (severe) ([place]) 05/30/2016  . Chronic pain in left shoulder   . CKD (chronic kidney disease)    pt not aware of this  . CKD (chronic kidney disease) stage 3, GFR 30-59 ml/min (HCC)   . Depression   . Hypertension   . Hypothyroidism   . Pneumonia   . Pre-diabetes   . PVC's (premature ventricular contractions) 12/22/2015  . 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 APPENDECTOMY    . REVERSE SHOULDER ARTHROPLASTY Left 03/01/2017   Procedure: REVERSE SHOULDER ARTHROPLASTY;  Surgeon: Justice Britain, MD;  Location: [facility];  Service: Orthopedics;  Laterality: Left;  . TUBAL LIGATION       reports that  has never smoked. she has never used smokeless tobacco. She reports that she does not drink alcohol or use drugs. Social History   Socioeconomic History  . Marital status: Widowed    Spouse name: Not on file  . Number of children: Not on file  . Years of education: Not on file  . Highest education level: Not on file  Social Needs  . Financial resource strain: Not on file  . Food insecurity - worry: Not on file  . Food insecurity - inability: Not on file  . Transportation needs - medical: Not on file  . Transportation needs - non-medical: Not on file  Occupational History  . Not on file  Tobacco Use  . Smoking status: Never Smoker  . Smokeless tobacco: Never Used  Substance and Sexual Activity  . Alcohol use: No  . Drug use: No  . Sexual activity: Not on file  Other Topics Concern  . Not on file  Social History Narrative   Admitted to [facility] 03/05/17   Widowed   Never smoked   Alcohol none   Full Code    Pertinent  Health Maintenance Due  Topic Date Due  . MAMMOGRAM  05/20/1998  . COLONOSCOPY  05/20/1998  . DEXA SCAN  05/20/2013  . PNA vac Low Risk Adult (1 of 2 - PCV13) 05/20/2013  . INFLUENZA VACCINE  Completed    Medications: Allergies as of 03/22/2017   No Known Allergies     Medication List        Accurate as of 03/22/17 10:44 AM. Always use your most recent med list.          albuterol 108 (90 Base) MCG/ACT inhaler Commonly known as:  PROVENTIL HFA;VENTOLIN HFA Inhale 2 puffs into the lungs every 4 (four) hours as needed for wheezing or shortness of breath.   amLODipine 10 MG tablet Commonly known as:  NORVASC Take 1 tablet (10 mg total) by mouth daily.   benzonatate 200 MG capsule Commonly known as:  TESSALON Take 1 capsule (200 mg total) by mouth 3 (three) times daily as needed for cough.   escitalopram 10 MG  tablet Commonly known as:  LEXAPRO Take 10 mg daily by mouth.   fluticasone 50 MCG/ACT nasal spray Commonly known as:  FLONASE Place 2 sprays into both nostrils daily.   furosemide 40 MG tablet Commonly known as:  LASIX Take 40 mg by mouth. Take one tablet daily for BLE swelling   guaiFENesin 600 MG 12 hr tablet Commonly known as:  MUCINEX Take 1 tablet (600 mg total) by mouth 2 (two) times daily.   levothyroxine 75 MCG tablet Commonly known as:  SYNTHROID, LEVOTHROID Take 75 mcg daily before breakfast by mouth.   lisinopril 10 MG tablet Commonly known as:  PRINIVIL,ZESTRIL Take 2 tablets (20 mg total) by mouth daily.   loratadine 10 MG tablet Commonly known as:  CLARITIN Take 1 tablet (10 mg total) by mouth daily.   meloxicam 15 MG tablet Commonly known as:  MOBIC Take 15 mg daily by mouth.   oxyCODONE-acetaminophen 5-325 MG tablet Commonly known as:  PERCOCET/ROXICET Take by mouth. Take one tablet every 6 hours as needed for severe pain   traMADol 50 MG tablet Commonly known as:  ULTRAM Take 1 tablet (50 mg total) by mouth every 6 (six) hours as needed for moderate pain.        Vitals:   03/22/17 0907  BP: 127/77  Pulse: 71  Resp: 18  Temp: (!) 97.3 F (36.3 C)  SpO2: 97%  Weight: 288 lb (130.6 kg)  Height: 5\' 6"  (1.676 m)   Body mass index is 46.48 kg/m.  Physical Exam  GENERAL APPEARANCE: Alert, conversant. No acute distress.  HEENT: Unremarkable. RESPIRATORY: Breathing is even, unlabored. Lung sounds are clear   CARDIOVASCULAR: Heart RRR no murmurs, rubs or gallops. No peripheral edema.  GASTROINTESTINAL: Abdomen is soft, non-tender, not distended w/ normal bowel sounds.  NEUROLOGIC: Cranial nerves 2-12 grossly intact. Moves all extremities   Labs reviewed: Basic Metabolic Panel: Recent Labs    05/29/16 1934  05/31/16 0537  06/10/16 03/01/17 1141 03/12/17  NA 138   < > 139   < > 140 137 138  K 3.4*  --  3.8   < > 4.2 3.9 4.8  CL 102   --  102  --   --  103  --   CO2 26  --  27  --   --  25  --   GLUCOSE 113*  --  115*  --   --  129*  --   BUN 15   < > 19   < > 21 15 22*  CREATININE 1.22*   < > 1.12*   < > 1.0 1.08* 1.0  CALCIUM 8.8*  --  8.4*  --   --  9.1  --    < > =  values in this interval not displayed.   No results found for: [facility] Liver Function Tests: Recent Labs    05/29/16 05/29/16 1934 05/31/16 0537  AST 22 22 30   ALT 10 10* 13*  ALKPHOS 65 65 60  BILITOT  --  0.8 0.5  PROT  --  7.7 6.5  ALBUMIN  --  3.8 3.2*   No results for input(s): LIPASE, AMYLASE in the last 8760 hours. No results for input(s): AMMONIA in the last 8760 hours. CBC: Recent Labs    05/29/16 1934  05/31/16 0537  06/10/16 03/01/17 1141 03/12/17  WBC 3.9*   < > 5.5   < > 7.1 4.5 6.3  NEUTROABS 1.6*  --   --   --   --   --   --   HGB 13.4  --  11.7*   < > 13.9 12.5 10.1*  HCT 41.3  --  37.6   < > 43 39.5 32*  MCV 87.5  --  89.1  --   --  90.2  --   PLT 251  --  225   < > 280 280 385   < > = values in this interval not displayed.   Lipid No results for input(s): CHOL, HDL, LDLCALC, TRIG in the last 8760 hours. Cardiac Enzymes: Recent Labs    05/29/16 1934  TROPONINI <0.03   BNP: No results for input(s): BNP in the last 8760 hours. CBG: Recent Labs    03/01/17 1153  GLUCAP 128*    Procedures and Imaging Studies During Stay: Dg Cervical Spine Complete  Result Date: 02/24/2017 CLINICAL DATA:  Pt fell outside while at the salon today. Pt states she lost her balance. Neck pain. EXAM: CERVICAL SPINE - COMPLETE 4+ VIEW COMPARISON:  None. FINDINGS: No fracture or spondylolisthesis. Mild loss of disc height at C5-C6 and C6-C7 with endplate spurring. Mild neural foraminal narrowing from uncovertebral spurring at these levels. Soft tissues are unremarkable. IMPRESSION: No fracture, spondylolisthesis or acute finding. Electronically Signed   By: Lajean Manes M.D.   On: 02/24/2017 19:42   Dg Shoulder Left  Result Date:  02/24/2017 CLINICAL DATA:  Pt fell outside while at the salon today. Pt states she lost her balance. Left shoulder pain. EXAM: LEFT SHOULDER - 2+ VIEW COMPARISON:  None. FINDINGS: Fracture across the proximal humeral metaphysis which is not well-defined. The shaft component has migrated superiorly in relation to the humeral head component. No other fractures.  No dislocation. There is significant narrowing of the glenohumeral joint with prominent marginal osteophytes from base of the humeral head. AC joint is normally spaced and aligned. Bones are diffusely demineralized. IMPRESSION: 1. Although the fracture line is not defined, the configuration of the proximal humerus is consistent with a fracture of the metaphysis, with shaft impacting against the humeral head component. 2. No dislocation. 3. Advanced glenohumeral joint arthropathic changes. Electronically Signed   By: Lajean Manes M.D.   On: 02/24/2017 19:45   Dg Knee Complete 4 Views Left  Result Date: 02/24/2017 CLINICAL DATA:  Pt fell outside while at the salon today. Pt states she lost her balance. Left knee pain. EXAM: LEFT KNEE - COMPLETE 4+ VIEW COMPARISON:  None. FINDINGS: No fracture.  No bone lesion. There is lateral and patellofemoral joint space compartment narrowing associated with marginal osteophytes. Subtle chondrocalcinosis noted along the menisci. Bones are demineralized. No convincing joint effusion. Surrounding soft tissues are unremarkable. IMPRESSION: 1. No fracture, dislocation or acute finding. 2. Arthropathic changes  of the left knee.  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 Electronically Signed   By: Lajean Manes M.D.   On: 02/24/2017 19:41   Dg Humerus Left  Result Date: 02/24/2017 CLINICAL DATA:  Pt fell outside while at the salon today. Pt states she lost her balance EXAM: LEFT HUMERUS - 2+ VIEW COMPARISON:  None. FINDINGS: Evidence of a fracture of the proximal humeral metaphysis as defined on the left shoulder radiographs. Fracture  line is better seen on the humeral radiographs than the shoulder radiographs. No other fractures.  No dislocation.  The bones are demineralized. IMPRESSION: Fracture across the proximal metaphysis of the left humerus with the shaft impacting against the humeral head component. No other fractures. No dislocation. Electronically Signed   By: Lajean Manes M.D.   On: 02/24/2017 19:46    Assessment/Plan:   Other closed nondisplaced fracture of proximal end of humerus with routine healing, unspecified laterality, subsequent encounter  Status post reverse arthroplasty of left shoulder  Hypothyroidism due to acquired atrophy of thyroid  Essential hypertension  Moderate persistent asthma without complication  Depression, unspecified depression type  Anxiety   Patient is being discharged with the following home health services:  OT/ PT /nursing/home health aide  Patient is being discharged with the following durable medical equipment:  Right-handed hemiwalker  Patient has been advised to f/u with their PCP in 1-2 weeks to bring them up to date on their rehab stay.  Social services at facility was responsible for arranging this appointment.  Pt was provided with a 30 day supply of prescriptions for medications and refills must be obtained from their PCP.  For controlled substances, a more limited supply may be provided adequate until PCP appointment only.  Medications have been reconciled.   Time spent greater than 30 minutes;> 50% of time with patient was spent reviewing records, labs, tests and studies, counseling and developing plan of care  Noah Delaine. Sheppard Coil, MD

## 2017-03-26 LAB — BASIC METABOLIC PANEL
BUN: 32 — AB (ref 4–21)
Creatinine: 1.1 (ref 0.5–1.1)
Glucose: 127
POTASSIUM: 4.3 (ref 3.4–5.3)
SODIUM: 142 (ref 137–147)

## 2017-03-27 ENCOUNTER — Other Ambulatory Visit: Payer: Self-pay

## 2017-03-27 DIAGNOSIS — R2689 Other abnormalities of gait and mobility: Secondary | ICD-10-CM | POA: Diagnosis not present

## 2017-03-27 DIAGNOSIS — S42292D Other displaced fracture of upper end of left humerus, subsequent encounter for fracture with routine healing: Secondary | ICD-10-CM | POA: Diagnosis not present

## 2017-03-27 DIAGNOSIS — I129 Hypertensive chronic kidney disease with stage 1 through stage 4 chronic kidney disease, or unspecified chronic kidney disease: Secondary | ICD-10-CM | POA: Diagnosis not present

## 2017-03-27 DIAGNOSIS — F329 Major depressive disorder, single episode, unspecified: Secondary | ICD-10-CM | POA: Diagnosis not present

## 2017-03-27 DIAGNOSIS — M6281 Muscle weakness (generalized): Secondary | ICD-10-CM | POA: Diagnosis not present

## 2017-03-27 DIAGNOSIS — N184 Chronic kidney disease, stage 4 (severe): Secondary | ICD-10-CM | POA: Diagnosis not present

## 2017-03-28 DIAGNOSIS — I129 Hypertensive chronic kidney disease with stage 1 through stage 4 chronic kidney disease, or unspecified chronic kidney disease: Secondary | ICD-10-CM | POA: Diagnosis not present

## 2017-03-28 DIAGNOSIS — R2689 Other abnormalities of gait and mobility: Secondary | ICD-10-CM | POA: Diagnosis not present

## 2017-03-28 DIAGNOSIS — S42292D Other displaced fracture of upper end of left humerus, subsequent encounter for fracture with routine healing: Secondary | ICD-10-CM | POA: Diagnosis not present

## 2017-03-28 DIAGNOSIS — M6281 Muscle weakness (generalized): Secondary | ICD-10-CM | POA: Diagnosis not present

## 2017-03-28 DIAGNOSIS — N184 Chronic kidney disease, stage 4 (severe): Secondary | ICD-10-CM | POA: Diagnosis not present

## 2017-03-28 DIAGNOSIS — F329 Major depressive disorder, single episode, unspecified: Secondary | ICD-10-CM | POA: Diagnosis not present

## 2017-03-29 DIAGNOSIS — N184 Chronic kidney disease, stage 4 (severe): Secondary | ICD-10-CM | POA: Diagnosis not present

## 2017-03-29 DIAGNOSIS — S42292D Other displaced fracture of upper end of left humerus, subsequent encounter for fracture with routine healing: Secondary | ICD-10-CM | POA: Diagnosis not present

## 2017-03-29 DIAGNOSIS — F329 Major depressive disorder, single episode, unspecified: Secondary | ICD-10-CM | POA: Diagnosis not present

## 2017-03-29 DIAGNOSIS — M6281 Muscle weakness (generalized): Secondary | ICD-10-CM | POA: Diagnosis not present

## 2017-03-29 DIAGNOSIS — I129 Hypertensive chronic kidney disease with stage 1 through stage 4 chronic kidney disease, or unspecified chronic kidney disease: Secondary | ICD-10-CM | POA: Diagnosis not present

## 2017-03-29 DIAGNOSIS — R2689 Other abnormalities of gait and mobility: Secondary | ICD-10-CM | POA: Diagnosis not present

## 2017-03-30 DIAGNOSIS — Z23 Encounter for immunization: Secondary | ICD-10-CM | POA: Diagnosis not present

## 2017-03-30 DIAGNOSIS — E785 Hyperlipidemia, unspecified: Secondary | ICD-10-CM | POA: Diagnosis not present

## 2017-03-30 DIAGNOSIS — M19012 Primary osteoarthritis, left shoulder: Secondary | ICD-10-CM | POA: Diagnosis not present

## 2017-03-30 DIAGNOSIS — R600 Localized edema: Secondary | ICD-10-CM | POA: Diagnosis not present

## 2017-03-30 DIAGNOSIS — E119 Type 2 diabetes mellitus without complications: Secondary | ICD-10-CM | POA: Diagnosis not present

## 2017-03-30 DIAGNOSIS — F329 Major depressive disorder, single episode, unspecified: Secondary | ICD-10-CM | POA: Diagnosis not present

## 2017-03-30 DIAGNOSIS — I129 Hypertensive chronic kidney disease with stage 1 through stage 4 chronic kidney disease, or unspecified chronic kidney disease: Secondary | ICD-10-CM | POA: Diagnosis not present

## 2017-03-30 DIAGNOSIS — Z79899 Other long term (current) drug therapy: Secondary | ICD-10-CM | POA: Diagnosis not present

## 2017-03-30 DIAGNOSIS — N183 Chronic kidney disease, stage 3 (moderate): Secondary | ICD-10-CM | POA: Diagnosis not present

## 2017-03-30 DIAGNOSIS — S42202A Unspecified fracture of upper end of left humerus, initial encounter for closed fracture: Secondary | ICD-10-CM | POA: Diagnosis not present

## 2017-03-30 DIAGNOSIS — E039 Hypothyroidism, unspecified: Secondary | ICD-10-CM | POA: Diagnosis not present

## 2017-04-04 DIAGNOSIS — F329 Major depressive disorder, single episode, unspecified: Secondary | ICD-10-CM | POA: Diagnosis not present

## 2017-04-04 DIAGNOSIS — N184 Chronic kidney disease, stage 4 (severe): Secondary | ICD-10-CM | POA: Diagnosis not present

## 2017-04-04 DIAGNOSIS — M6281 Muscle weakness (generalized): Secondary | ICD-10-CM | POA: Diagnosis not present

## 2017-04-04 DIAGNOSIS — R2689 Other abnormalities of gait and mobility: Secondary | ICD-10-CM | POA: Diagnosis not present

## 2017-04-04 DIAGNOSIS — S42292D Other displaced fracture of upper end of left humerus, subsequent encounter for fracture with routine healing: Secondary | ICD-10-CM | POA: Diagnosis not present

## 2017-04-04 DIAGNOSIS — I129 Hypertensive chronic kidney disease with stage 1 through stage 4 chronic kidney disease, or unspecified chronic kidney disease: Secondary | ICD-10-CM | POA: Diagnosis not present

## 2017-04-05 DIAGNOSIS — S42292D Other displaced fracture of upper end of left humerus, subsequent encounter for fracture with routine healing: Secondary | ICD-10-CM | POA: Diagnosis not present

## 2017-04-05 DIAGNOSIS — I129 Hypertensive chronic kidney disease with stage 1 through stage 4 chronic kidney disease, or unspecified chronic kidney disease: Secondary | ICD-10-CM | POA: Diagnosis not present

## 2017-04-05 DIAGNOSIS — N184 Chronic kidney disease, stage 4 (severe): Secondary | ICD-10-CM | POA: Diagnosis not present

## 2017-04-05 DIAGNOSIS — M6281 Muscle weakness (generalized): Secondary | ICD-10-CM | POA: Diagnosis not present

## 2017-04-05 DIAGNOSIS — R2689 Other abnormalities of gait and mobility: Secondary | ICD-10-CM | POA: Diagnosis not present

## 2017-04-05 DIAGNOSIS — F329 Major depressive disorder, single episode, unspecified: Secondary | ICD-10-CM | POA: Diagnosis not present

## 2017-04-06 DIAGNOSIS — N184 Chronic kidney disease, stage 4 (severe): Secondary | ICD-10-CM | POA: Diagnosis not present

## 2017-04-06 DIAGNOSIS — M6281 Muscle weakness (generalized): Secondary | ICD-10-CM | POA: Diagnosis not present

## 2017-04-06 DIAGNOSIS — R2689 Other abnormalities of gait and mobility: Secondary | ICD-10-CM | POA: Diagnosis not present

## 2017-04-06 DIAGNOSIS — I129 Hypertensive chronic kidney disease with stage 1 through stage 4 chronic kidney disease, or unspecified chronic kidney disease: Secondary | ICD-10-CM | POA: Diagnosis not present

## 2017-04-06 DIAGNOSIS — F329 Major depressive disorder, single episode, unspecified: Secondary | ICD-10-CM | POA: Diagnosis not present

## 2017-04-06 DIAGNOSIS — S42292D Other displaced fracture of upper end of left humerus, subsequent encounter for fracture with routine healing: Secondary | ICD-10-CM | POA: Diagnosis not present

## 2017-04-09 DIAGNOSIS — F329 Major depressive disorder, single episode, unspecified: Secondary | ICD-10-CM | POA: Diagnosis not present

## 2017-04-09 DIAGNOSIS — I129 Hypertensive chronic kidney disease with stage 1 through stage 4 chronic kidney disease, or unspecified chronic kidney disease: Secondary | ICD-10-CM | POA: Diagnosis not present

## 2017-04-09 DIAGNOSIS — S42292D Other displaced fracture of upper end of left humerus, subsequent encounter for fracture with routine healing: Secondary | ICD-10-CM | POA: Diagnosis not present

## 2017-04-09 DIAGNOSIS — N184 Chronic kidney disease, stage 4 (severe): Secondary | ICD-10-CM | POA: Diagnosis not present

## 2017-04-09 DIAGNOSIS — M6281 Muscle weakness (generalized): Secondary | ICD-10-CM | POA: Diagnosis not present

## 2017-04-09 DIAGNOSIS — R2689 Other abnormalities of gait and mobility: Secondary | ICD-10-CM | POA: Diagnosis not present

## 2017-04-11 DIAGNOSIS — S42292D Other displaced fracture of upper end of left humerus, subsequent encounter for fracture with routine healing: Secondary | ICD-10-CM | POA: Diagnosis not present

## 2017-04-11 DIAGNOSIS — F329 Major depressive disorder, single episode, unspecified: Secondary | ICD-10-CM | POA: Diagnosis not present

## 2017-04-11 DIAGNOSIS — M6281 Muscle weakness (generalized): Secondary | ICD-10-CM | POA: Diagnosis not present

## 2017-04-11 DIAGNOSIS — N184 Chronic kidney disease, stage 4 (severe): Secondary | ICD-10-CM | POA: Diagnosis not present

## 2017-04-11 DIAGNOSIS — I129 Hypertensive chronic kidney disease with stage 1 through stage 4 chronic kidney disease, or unspecified chronic kidney disease: Secondary | ICD-10-CM | POA: Diagnosis not present

## 2017-04-11 DIAGNOSIS — R2689 Other abnormalities of gait and mobility: Secondary | ICD-10-CM | POA: Diagnosis not present

## 2017-04-20 DIAGNOSIS — I129 Hypertensive chronic kidney disease with stage 1 through stage 4 chronic kidney disease, or unspecified chronic kidney disease: Secondary | ICD-10-CM | POA: Diagnosis not present

## 2017-04-20 DIAGNOSIS — N184 Chronic kidney disease, stage 4 (severe): Secondary | ICD-10-CM | POA: Diagnosis not present

## 2017-04-20 DIAGNOSIS — M6281 Muscle weakness (generalized): Secondary | ICD-10-CM | POA: Diagnosis not present

## 2017-04-20 DIAGNOSIS — F329 Major depressive disorder, single episode, unspecified: Secondary | ICD-10-CM | POA: Diagnosis not present

## 2017-04-20 DIAGNOSIS — S42292D Other displaced fracture of upper end of left humerus, subsequent encounter for fracture with routine healing: Secondary | ICD-10-CM | POA: Diagnosis not present

## 2017-04-20 DIAGNOSIS — R2689 Other abnormalities of gait and mobility: Secondary | ICD-10-CM | POA: Diagnosis not present

## 2017-04-23 DIAGNOSIS — S42292D Other displaced fracture of upper end of left humerus, subsequent encounter for fracture with routine healing: Secondary | ICD-10-CM | POA: Diagnosis not present

## 2017-04-23 DIAGNOSIS — Z96612 Presence of left artificial shoulder joint: Secondary | ICD-10-CM | POA: Diagnosis not present

## 2017-04-23 DIAGNOSIS — Z471 Aftercare following joint replacement surgery: Secondary | ICD-10-CM | POA: Diagnosis not present

## 2017-04-25 DIAGNOSIS — F329 Major depressive disorder, single episode, unspecified: Secondary | ICD-10-CM | POA: Diagnosis not present

## 2017-04-25 DIAGNOSIS — M6281 Muscle weakness (generalized): Secondary | ICD-10-CM | POA: Diagnosis not present

## 2017-04-25 DIAGNOSIS — R2689 Other abnormalities of gait and mobility: Secondary | ICD-10-CM | POA: Diagnosis not present

## 2017-04-25 DIAGNOSIS — N184 Chronic kidney disease, stage 4 (severe): Secondary | ICD-10-CM | POA: Diagnosis not present

## 2017-04-25 DIAGNOSIS — I129 Hypertensive chronic kidney disease with stage 1 through stage 4 chronic kidney disease, or unspecified chronic kidney disease: Secondary | ICD-10-CM | POA: Diagnosis not present

## 2017-04-25 DIAGNOSIS — S42292D Other displaced fracture of upper end of left humerus, subsequent encounter for fracture with routine healing: Secondary | ICD-10-CM | POA: Diagnosis not present

## 2017-04-26 DIAGNOSIS — R2689 Other abnormalities of gait and mobility: Secondary | ICD-10-CM | POA: Diagnosis not present

## 2017-04-26 DIAGNOSIS — M6281 Muscle weakness (generalized): Secondary | ICD-10-CM | POA: Diagnosis not present

## 2017-04-26 DIAGNOSIS — N184 Chronic kidney disease, stage 4 (severe): Secondary | ICD-10-CM | POA: Diagnosis not present

## 2017-04-26 DIAGNOSIS — F329 Major depressive disorder, single episode, unspecified: Secondary | ICD-10-CM | POA: Diagnosis not present

## 2017-04-26 DIAGNOSIS — S42292D Other displaced fracture of upper end of left humerus, subsequent encounter for fracture with routine healing: Secondary | ICD-10-CM | POA: Diagnosis not present

## 2017-04-26 DIAGNOSIS — I129 Hypertensive chronic kidney disease with stage 1 through stage 4 chronic kidney disease, or unspecified chronic kidney disease: Secondary | ICD-10-CM | POA: Diagnosis not present

## 2017-04-27 DIAGNOSIS — R2689 Other abnormalities of gait and mobility: Secondary | ICD-10-CM | POA: Diagnosis not present

## 2017-04-27 DIAGNOSIS — S42292D Other displaced fracture of upper end of left humerus, subsequent encounter for fracture with routine healing: Secondary | ICD-10-CM | POA: Diagnosis not present

## 2017-04-27 DIAGNOSIS — F329 Major depressive disorder, single episode, unspecified: Secondary | ICD-10-CM | POA: Diagnosis not present

## 2017-04-27 DIAGNOSIS — I129 Hypertensive chronic kidney disease with stage 1 through stage 4 chronic kidney disease, or unspecified chronic kidney disease: Secondary | ICD-10-CM | POA: Diagnosis not present

## 2017-04-27 DIAGNOSIS — N184 Chronic kidney disease, stage 4 (severe): Secondary | ICD-10-CM | POA: Diagnosis not present

## 2017-04-27 DIAGNOSIS — M6281 Muscle weakness (generalized): Secondary | ICD-10-CM | POA: Diagnosis not present

## 2017-04-30 DIAGNOSIS — R2689 Other abnormalities of gait and mobility: Secondary | ICD-10-CM | POA: Diagnosis not present

## 2017-04-30 DIAGNOSIS — M6281 Muscle weakness (generalized): Secondary | ICD-10-CM | POA: Diagnosis not present

## 2017-04-30 DIAGNOSIS — I129 Hypertensive chronic kidney disease with stage 1 through stage 4 chronic kidney disease, or unspecified chronic kidney disease: Secondary | ICD-10-CM | POA: Diagnosis not present

## 2017-04-30 DIAGNOSIS — S42292D Other displaced fracture of upper end of left humerus, subsequent encounter for fracture with routine healing: Secondary | ICD-10-CM | POA: Diagnosis not present

## 2017-04-30 DIAGNOSIS — F329 Major depressive disorder, single episode, unspecified: Secondary | ICD-10-CM | POA: Diagnosis not present

## 2017-04-30 DIAGNOSIS — N184 Chronic kidney disease, stage 4 (severe): Secondary | ICD-10-CM | POA: Diagnosis not present

## 2017-05-01 DIAGNOSIS — S42292D Other displaced fracture of upper end of left humerus, subsequent encounter for fracture with routine healing: Secondary | ICD-10-CM | POA: Diagnosis not present

## 2017-05-01 DIAGNOSIS — F329 Major depressive disorder, single episode, unspecified: Secondary | ICD-10-CM | POA: Diagnosis not present

## 2017-05-01 DIAGNOSIS — I129 Hypertensive chronic kidney disease with stage 1 through stage 4 chronic kidney disease, or unspecified chronic kidney disease: Secondary | ICD-10-CM | POA: Diagnosis not present

## 2017-05-01 DIAGNOSIS — N184 Chronic kidney disease, stage 4 (severe): Secondary | ICD-10-CM | POA: Diagnosis not present

## 2017-05-01 DIAGNOSIS — R2689 Other abnormalities of gait and mobility: Secondary | ICD-10-CM | POA: Diagnosis not present

## 2017-05-01 DIAGNOSIS — M6281 Muscle weakness (generalized): Secondary | ICD-10-CM | POA: Diagnosis not present

## 2017-05-02 DIAGNOSIS — N184 Chronic kidney disease, stage 4 (severe): Secondary | ICD-10-CM | POA: Diagnosis not present

## 2017-05-02 DIAGNOSIS — R2689 Other abnormalities of gait and mobility: Secondary | ICD-10-CM | POA: Diagnosis not present

## 2017-05-02 DIAGNOSIS — M6281 Muscle weakness (generalized): Secondary | ICD-10-CM | POA: Diagnosis not present

## 2017-05-02 DIAGNOSIS — S42292D Other displaced fracture of upper end of left humerus, subsequent encounter for fracture with routine healing: Secondary | ICD-10-CM | POA: Diagnosis not present

## 2017-05-02 DIAGNOSIS — I129 Hypertensive chronic kidney disease with stage 1 through stage 4 chronic kidney disease, or unspecified chronic kidney disease: Secondary | ICD-10-CM | POA: Diagnosis not present

## 2017-05-02 DIAGNOSIS — F329 Major depressive disorder, single episode, unspecified: Secondary | ICD-10-CM | POA: Diagnosis not present

## 2017-05-03 DIAGNOSIS — S42292D Other displaced fracture of upper end of left humerus, subsequent encounter for fracture with routine healing: Secondary | ICD-10-CM | POA: Diagnosis not present

## 2017-05-03 DIAGNOSIS — I129 Hypertensive chronic kidney disease with stage 1 through stage 4 chronic kidney disease, or unspecified chronic kidney disease: Secondary | ICD-10-CM | POA: Diagnosis not present

## 2017-05-03 DIAGNOSIS — M6281 Muscle weakness (generalized): Secondary | ICD-10-CM | POA: Diagnosis not present

## 2017-05-03 DIAGNOSIS — R2689 Other abnormalities of gait and mobility: Secondary | ICD-10-CM | POA: Diagnosis not present

## 2017-05-03 DIAGNOSIS — F329 Major depressive disorder, single episode, unspecified: Secondary | ICD-10-CM | POA: Diagnosis not present

## 2017-05-03 DIAGNOSIS — N184 Chronic kidney disease, stage 4 (severe): Secondary | ICD-10-CM | POA: Diagnosis not present

## 2017-05-04 DIAGNOSIS — F329 Major depressive disorder, single episode, unspecified: Secondary | ICD-10-CM | POA: Diagnosis not present

## 2017-05-04 DIAGNOSIS — R2689 Other abnormalities of gait and mobility: Secondary | ICD-10-CM | POA: Diagnosis not present

## 2017-05-04 DIAGNOSIS — I129 Hypertensive chronic kidney disease with stage 1 through stage 4 chronic kidney disease, or unspecified chronic kidney disease: Secondary | ICD-10-CM | POA: Diagnosis not present

## 2017-05-04 DIAGNOSIS — M6281 Muscle weakness (generalized): Secondary | ICD-10-CM | POA: Diagnosis not present

## 2017-05-04 DIAGNOSIS — S42292D Other displaced fracture of upper end of left humerus, subsequent encounter for fracture with routine healing: Secondary | ICD-10-CM | POA: Diagnosis not present

## 2017-05-04 DIAGNOSIS — N184 Chronic kidney disease, stage 4 (severe): Secondary | ICD-10-CM | POA: Diagnosis not present

## 2017-05-07 DIAGNOSIS — R2689 Other abnormalities of gait and mobility: Secondary | ICD-10-CM | POA: Diagnosis not present

## 2017-05-07 DIAGNOSIS — M6281 Muscle weakness (generalized): Secondary | ICD-10-CM | POA: Diagnosis not present

## 2017-05-07 DIAGNOSIS — N184 Chronic kidney disease, stage 4 (severe): Secondary | ICD-10-CM | POA: Diagnosis not present

## 2017-05-07 DIAGNOSIS — F329 Major depressive disorder, single episode, unspecified: Secondary | ICD-10-CM | POA: Diagnosis not present

## 2017-05-07 DIAGNOSIS — I129 Hypertensive chronic kidney disease with stage 1 through stage 4 chronic kidney disease, or unspecified chronic kidney disease: Secondary | ICD-10-CM | POA: Diagnosis not present

## 2017-05-07 DIAGNOSIS — S42292D Other displaced fracture of upper end of left humerus, subsequent encounter for fracture with routine healing: Secondary | ICD-10-CM | POA: Diagnosis not present

## 2017-05-08 DIAGNOSIS — M6281 Muscle weakness (generalized): Secondary | ICD-10-CM | POA: Diagnosis not present

## 2017-05-08 DIAGNOSIS — S42292D Other displaced fracture of upper end of left humerus, subsequent encounter for fracture with routine healing: Secondary | ICD-10-CM | POA: Diagnosis not present

## 2017-05-08 DIAGNOSIS — I129 Hypertensive chronic kidney disease with stage 1 through stage 4 chronic kidney disease, or unspecified chronic kidney disease: Secondary | ICD-10-CM | POA: Diagnosis not present

## 2017-05-08 DIAGNOSIS — N184 Chronic kidney disease, stage 4 (severe): Secondary | ICD-10-CM | POA: Diagnosis not present

## 2017-05-08 DIAGNOSIS — F329 Major depressive disorder, single episode, unspecified: Secondary | ICD-10-CM | POA: Diagnosis not present

## 2017-05-08 DIAGNOSIS — R2689 Other abnormalities of gait and mobility: Secondary | ICD-10-CM | POA: Diagnosis not present

## 2017-05-10 DIAGNOSIS — R2689 Other abnormalities of gait and mobility: Secondary | ICD-10-CM | POA: Diagnosis not present

## 2017-05-10 DIAGNOSIS — F329 Major depressive disorder, single episode, unspecified: Secondary | ICD-10-CM | POA: Diagnosis not present

## 2017-05-10 DIAGNOSIS — N184 Chronic kidney disease, stage 4 (severe): Secondary | ICD-10-CM | POA: Diagnosis not present

## 2017-05-10 DIAGNOSIS — I129 Hypertensive chronic kidney disease with stage 1 through stage 4 chronic kidney disease, or unspecified chronic kidney disease: Secondary | ICD-10-CM | POA: Diagnosis not present

## 2017-05-10 DIAGNOSIS — S42292D Other displaced fracture of upper end of left humerus, subsequent encounter for fracture with routine healing: Secondary | ICD-10-CM | POA: Diagnosis not present

## 2017-05-10 DIAGNOSIS — M6281 Muscle weakness (generalized): Secondary | ICD-10-CM | POA: Diagnosis not present

## 2017-05-11 DIAGNOSIS — F329 Major depressive disorder, single episode, unspecified: Secondary | ICD-10-CM | POA: Diagnosis not present

## 2017-05-11 DIAGNOSIS — I129 Hypertensive chronic kidney disease with stage 1 through stage 4 chronic kidney disease, or unspecified chronic kidney disease: Secondary | ICD-10-CM | POA: Diagnosis not present

## 2017-05-11 DIAGNOSIS — M6281 Muscle weakness (generalized): Secondary | ICD-10-CM | POA: Diagnosis not present

## 2017-05-11 DIAGNOSIS — N184 Chronic kidney disease, stage 4 (severe): Secondary | ICD-10-CM | POA: Diagnosis not present

## 2017-05-11 DIAGNOSIS — S42292D Other displaced fracture of upper end of left humerus, subsequent encounter for fracture with routine healing: Secondary | ICD-10-CM | POA: Diagnosis not present

## 2017-05-11 DIAGNOSIS — R2689 Other abnormalities of gait and mobility: Secondary | ICD-10-CM | POA: Diagnosis not present

## 2017-05-15 DIAGNOSIS — F329 Major depressive disorder, single episode, unspecified: Secondary | ICD-10-CM | POA: Diagnosis not present

## 2017-05-15 DIAGNOSIS — R2689 Other abnormalities of gait and mobility: Secondary | ICD-10-CM | POA: Diagnosis not present

## 2017-05-15 DIAGNOSIS — I129 Hypertensive chronic kidney disease with stage 1 through stage 4 chronic kidney disease, or unspecified chronic kidney disease: Secondary | ICD-10-CM | POA: Diagnosis not present

## 2017-05-15 DIAGNOSIS — N184 Chronic kidney disease, stage 4 (severe): Secondary | ICD-10-CM | POA: Diagnosis not present

## 2017-05-15 DIAGNOSIS — M6281 Muscle weakness (generalized): Secondary | ICD-10-CM | POA: Diagnosis not present

## 2017-05-15 DIAGNOSIS — S42292D Other displaced fracture of upper end of left humerus, subsequent encounter for fracture with routine healing: Secondary | ICD-10-CM | POA: Diagnosis not present

## 2017-05-16 DIAGNOSIS — S42292D Other displaced fracture of upper end of left humerus, subsequent encounter for fracture with routine healing: Secondary | ICD-10-CM | POA: Diagnosis not present

## 2017-05-16 DIAGNOSIS — N184 Chronic kidney disease, stage 4 (severe): Secondary | ICD-10-CM | POA: Diagnosis not present

## 2017-05-16 DIAGNOSIS — M6281 Muscle weakness (generalized): Secondary | ICD-10-CM | POA: Diagnosis not present

## 2017-05-16 DIAGNOSIS — I129 Hypertensive chronic kidney disease with stage 1 through stage 4 chronic kidney disease, or unspecified chronic kidney disease: Secondary | ICD-10-CM | POA: Diagnosis not present

## 2017-05-16 DIAGNOSIS — R2689 Other abnormalities of gait and mobility: Secondary | ICD-10-CM | POA: Diagnosis not present

## 2017-05-16 DIAGNOSIS — F329 Major depressive disorder, single episode, unspecified: Secondary | ICD-10-CM | POA: Diagnosis not present

## 2017-05-18 DIAGNOSIS — R2689 Other abnormalities of gait and mobility: Secondary | ICD-10-CM | POA: Diagnosis not present

## 2017-05-18 DIAGNOSIS — N184 Chronic kidney disease, stage 4 (severe): Secondary | ICD-10-CM | POA: Diagnosis not present

## 2017-05-18 DIAGNOSIS — I129 Hypertensive chronic kidney disease with stage 1 through stage 4 chronic kidney disease, or unspecified chronic kidney disease: Secondary | ICD-10-CM | POA: Diagnosis not present

## 2017-05-18 DIAGNOSIS — S42292D Other displaced fracture of upper end of left humerus, subsequent encounter for fracture with routine healing: Secondary | ICD-10-CM | POA: Diagnosis not present

## 2017-05-18 DIAGNOSIS — F329 Major depressive disorder, single episode, unspecified: Secondary | ICD-10-CM | POA: Diagnosis not present

## 2017-05-18 DIAGNOSIS — M6281 Muscle weakness (generalized): Secondary | ICD-10-CM | POA: Diagnosis not present

## 2017-05-22 DIAGNOSIS — F329 Major depressive disorder, single episode, unspecified: Secondary | ICD-10-CM | POA: Diagnosis not present

## 2017-05-22 DIAGNOSIS — I129 Hypertensive chronic kidney disease with stage 1 through stage 4 chronic kidney disease, or unspecified chronic kidney disease: Secondary | ICD-10-CM | POA: Diagnosis not present

## 2017-05-22 DIAGNOSIS — N184 Chronic kidney disease, stage 4 (severe): Secondary | ICD-10-CM | POA: Diagnosis not present

## 2017-05-22 DIAGNOSIS — M6281 Muscle weakness (generalized): Secondary | ICD-10-CM | POA: Diagnosis not present

## 2017-05-22 DIAGNOSIS — S42292D Other displaced fracture of upper end of left humerus, subsequent encounter for fracture with routine healing: Secondary | ICD-10-CM | POA: Diagnosis not present

## 2017-05-22 DIAGNOSIS — R2689 Other abnormalities of gait and mobility: Secondary | ICD-10-CM | POA: Diagnosis not present

## 2017-05-24 DIAGNOSIS — R2689 Other abnormalities of gait and mobility: Secondary | ICD-10-CM | POA: Diagnosis not present

## 2017-05-24 DIAGNOSIS — S42292D Other displaced fracture of upper end of left humerus, subsequent encounter for fracture with routine healing: Secondary | ICD-10-CM | POA: Diagnosis not present

## 2017-05-24 DIAGNOSIS — F329 Major depressive disorder, single episode, unspecified: Secondary | ICD-10-CM | POA: Diagnosis not present

## 2017-05-24 DIAGNOSIS — M6281 Muscle weakness (generalized): Secondary | ICD-10-CM | POA: Diagnosis not present

## 2017-05-24 DIAGNOSIS — I129 Hypertensive chronic kidney disease with stage 1 through stage 4 chronic kidney disease, or unspecified chronic kidney disease: Secondary | ICD-10-CM | POA: Diagnosis not present

## 2017-05-24 DIAGNOSIS — N184 Chronic kidney disease, stage 4 (severe): Secondary | ICD-10-CM | POA: Diagnosis not present

## 2017-05-26 DIAGNOSIS — M6281 Muscle weakness (generalized): Secondary | ICD-10-CM | POA: Diagnosis not present

## 2017-05-26 DIAGNOSIS — S42292D Other displaced fracture of upper end of left humerus, subsequent encounter for fracture with routine healing: Secondary | ICD-10-CM | POA: Diagnosis not present

## 2017-05-26 DIAGNOSIS — N184 Chronic kidney disease, stage 4 (severe): Secondary | ICD-10-CM | POA: Diagnosis not present

## 2017-05-26 DIAGNOSIS — I129 Hypertensive chronic kidney disease with stage 1 through stage 4 chronic kidney disease, or unspecified chronic kidney disease: Secondary | ICD-10-CM | POA: Diagnosis not present

## 2017-05-26 DIAGNOSIS — F329 Major depressive disorder, single episode, unspecified: Secondary | ICD-10-CM | POA: Diagnosis not present

## 2017-05-26 DIAGNOSIS — Z9181 History of falling: Secondary | ICD-10-CM | POA: Diagnosis not present

## 2017-05-28 DIAGNOSIS — F329 Major depressive disorder, single episode, unspecified: Secondary | ICD-10-CM | POA: Diagnosis not present

## 2017-05-28 DIAGNOSIS — N184 Chronic kidney disease, stage 4 (severe): Secondary | ICD-10-CM | POA: Diagnosis not present

## 2017-05-28 DIAGNOSIS — S42292D Other displaced fracture of upper end of left humerus, subsequent encounter for fracture with routine healing: Secondary | ICD-10-CM | POA: Diagnosis not present

## 2017-05-28 DIAGNOSIS — M170 Bilateral primary osteoarthritis of knee: Secondary | ICD-10-CM | POA: Diagnosis not present

## 2017-05-28 DIAGNOSIS — M5412 Radiculopathy, cervical region: Secondary | ICD-10-CM | POA: Diagnosis not present

## 2017-05-28 DIAGNOSIS — M6281 Muscle weakness (generalized): Secondary | ICD-10-CM | POA: Diagnosis not present

## 2017-05-28 DIAGNOSIS — Z96612 Presence of left artificial shoulder joint: Secondary | ICD-10-CM | POA: Diagnosis not present

## 2017-05-28 DIAGNOSIS — M5136 Other intervertebral disc degeneration, lumbar region: Secondary | ICD-10-CM | POA: Diagnosis not present

## 2017-05-28 DIAGNOSIS — Z9181 History of falling: Secondary | ICD-10-CM | POA: Diagnosis not present

## 2017-05-28 DIAGNOSIS — M1990 Unspecified osteoarthritis, unspecified site: Secondary | ICD-10-CM | POA: Diagnosis not present

## 2017-05-28 DIAGNOSIS — M9983 Other biomechanical lesions of lumbar region: Secondary | ICD-10-CM | POA: Diagnosis not present

## 2017-05-28 DIAGNOSIS — I129 Hypertensive chronic kidney disease with stage 1 through stage 4 chronic kidney disease, or unspecified chronic kidney disease: Secondary | ICD-10-CM | POA: Diagnosis not present

## 2017-05-31 DIAGNOSIS — S42292D Other displaced fracture of upper end of left humerus, subsequent encounter for fracture with routine healing: Secondary | ICD-10-CM | POA: Diagnosis not present

## 2017-05-31 DIAGNOSIS — I129 Hypertensive chronic kidney disease with stage 1 through stage 4 chronic kidney disease, or unspecified chronic kidney disease: Secondary | ICD-10-CM | POA: Diagnosis not present

## 2017-05-31 DIAGNOSIS — F329 Major depressive disorder, single episode, unspecified: Secondary | ICD-10-CM | POA: Diagnosis not present

## 2017-05-31 DIAGNOSIS — Z9181 History of falling: Secondary | ICD-10-CM | POA: Diagnosis not present

## 2017-05-31 DIAGNOSIS — M6281 Muscle weakness (generalized): Secondary | ICD-10-CM | POA: Diagnosis not present

## 2017-05-31 DIAGNOSIS — N184 Chronic kidney disease, stage 4 (severe): Secondary | ICD-10-CM | POA: Diagnosis not present

## 2017-06-06 DIAGNOSIS — Z96612 Presence of left artificial shoulder joint: Secondary | ICD-10-CM | POA: Diagnosis not present

## 2017-06-06 DIAGNOSIS — S42292D Other displaced fracture of upper end of left humerus, subsequent encounter for fracture with routine healing: Secondary | ICD-10-CM | POA: Diagnosis not present

## 2017-06-25 DIAGNOSIS — M9983 Other biomechanical lesions of lumbar region: Secondary | ICD-10-CM | POA: Diagnosis not present

## 2017-06-25 DIAGNOSIS — M5412 Radiculopathy, cervical region: Secondary | ICD-10-CM | POA: Diagnosis not present

## 2017-06-25 DIAGNOSIS — N184 Chronic kidney disease, stage 4 (severe): Secondary | ICD-10-CM | POA: Diagnosis not present

## 2017-06-25 DIAGNOSIS — M5136 Other intervertebral disc degeneration, lumbar region: Secondary | ICD-10-CM | POA: Diagnosis not present

## 2017-06-25 DIAGNOSIS — Z96612 Presence of left artificial shoulder joint: Secondary | ICD-10-CM | POA: Diagnosis not present

## 2017-06-25 DIAGNOSIS — S42292D Other displaced fracture of upper end of left humerus, subsequent encounter for fracture with routine healing: Secondary | ICD-10-CM | POA: Diagnosis not present

## 2017-06-25 DIAGNOSIS — M170 Bilateral primary osteoarthritis of knee: Secondary | ICD-10-CM | POA: Diagnosis not present

## 2017-06-25 DIAGNOSIS — M1990 Unspecified osteoarthritis, unspecified site: Secondary | ICD-10-CM | POA: Diagnosis not present

## 2017-07-03 DIAGNOSIS — M1711 Unilateral primary osteoarthritis, right knee: Secondary | ICD-10-CM | POA: Diagnosis not present

## 2017-07-10 DIAGNOSIS — E1122 Type 2 diabetes mellitus with diabetic chronic kidney disease: Secondary | ICD-10-CM | POA: Diagnosis not present

## 2017-07-10 DIAGNOSIS — E785 Hyperlipidemia, unspecified: Secondary | ICD-10-CM | POA: Diagnosis not present

## 2017-07-10 DIAGNOSIS — E039 Hypothyroidism, unspecified: Secondary | ICD-10-CM | POA: Diagnosis not present

## 2017-07-10 DIAGNOSIS — F329 Major depressive disorder, single episode, unspecified: Secondary | ICD-10-CM | POA: Diagnosis not present

## 2017-07-10 DIAGNOSIS — I129 Hypertensive chronic kidney disease with stage 1 through stage 4 chronic kidney disease, or unspecified chronic kidney disease: Secondary | ICD-10-CM | POA: Diagnosis not present

## 2017-07-10 DIAGNOSIS — E119 Type 2 diabetes mellitus without complications: Secondary | ICD-10-CM | POA: Diagnosis not present

## 2017-07-10 DIAGNOSIS — Z6841 Body Mass Index (BMI) 40.0 and over, adult: Secondary | ICD-10-CM | POA: Diagnosis not present

## 2017-07-10 DIAGNOSIS — N183 Chronic kidney disease, stage 3 (moderate): Secondary | ICD-10-CM | POA: Diagnosis not present

## 2017-07-10 DIAGNOSIS — R609 Edema, unspecified: Secondary | ICD-10-CM | POA: Diagnosis not present

## 2017-07-20 DIAGNOSIS — M1712 Unilateral primary osteoarthritis, left knee: Secondary | ICD-10-CM | POA: Diagnosis not present

## 2017-07-20 DIAGNOSIS — M1711 Unilateral primary osteoarthritis, right knee: Secondary | ICD-10-CM | POA: Diagnosis not present

## 2017-07-26 DIAGNOSIS — M5412 Radiculopathy, cervical region: Secondary | ICD-10-CM | POA: Diagnosis not present

## 2017-07-26 DIAGNOSIS — Z96612 Presence of left artificial shoulder joint: Secondary | ICD-10-CM | POA: Diagnosis not present

## 2017-07-26 DIAGNOSIS — M5136 Other intervertebral disc degeneration, lumbar region: Secondary | ICD-10-CM | POA: Diagnosis not present

## 2017-07-26 DIAGNOSIS — M1990 Unspecified osteoarthritis, unspecified site: Secondary | ICD-10-CM | POA: Diagnosis not present

## 2017-07-26 DIAGNOSIS — M170 Bilateral primary osteoarthritis of knee: Secondary | ICD-10-CM | POA: Diagnosis not present

## 2017-07-26 DIAGNOSIS — S42292D Other displaced fracture of upper end of left humerus, subsequent encounter for fracture with routine healing: Secondary | ICD-10-CM | POA: Diagnosis not present

## 2017-07-26 DIAGNOSIS — M9983 Other biomechanical lesions of lumbar region: Secondary | ICD-10-CM | POA: Diagnosis not present

## 2017-07-26 DIAGNOSIS — N184 Chronic kidney disease, stage 4 (severe): Secondary | ICD-10-CM | POA: Diagnosis not present

## 2017-08-25 DIAGNOSIS — M9983 Other biomechanical lesions of lumbar region: Secondary | ICD-10-CM | POA: Diagnosis not present

## 2017-08-25 DIAGNOSIS — N184 Chronic kidney disease, stage 4 (severe): Secondary | ICD-10-CM | POA: Diagnosis not present

## 2017-08-25 DIAGNOSIS — Z96612 Presence of left artificial shoulder joint: Secondary | ICD-10-CM | POA: Diagnosis not present

## 2017-08-25 DIAGNOSIS — M5136 Other intervertebral disc degeneration, lumbar region: Secondary | ICD-10-CM | POA: Diagnosis not present

## 2017-08-25 DIAGNOSIS — M170 Bilateral primary osteoarthritis of knee: Secondary | ICD-10-CM | POA: Diagnosis not present

## 2017-08-25 DIAGNOSIS — M5412 Radiculopathy, cervical region: Secondary | ICD-10-CM | POA: Diagnosis not present

## 2017-08-25 DIAGNOSIS — S42292D Other displaced fracture of upper end of left humerus, subsequent encounter for fracture with routine healing: Secondary | ICD-10-CM | POA: Diagnosis not present

## 2017-08-25 DIAGNOSIS — M1990 Unspecified osteoarthritis, unspecified site: Secondary | ICD-10-CM | POA: Diagnosis not present

## 2017-08-28 DIAGNOSIS — M1712 Unilateral primary osteoarthritis, left knee: Secondary | ICD-10-CM | POA: Diagnosis not present

## 2017-09-25 DIAGNOSIS — M5136 Other intervertebral disc degeneration, lumbar region: Secondary | ICD-10-CM | POA: Diagnosis not present

## 2017-09-25 DIAGNOSIS — S42292D Other displaced fracture of upper end of left humerus, subsequent encounter for fracture with routine healing: Secondary | ICD-10-CM | POA: Diagnosis not present

## 2017-09-25 DIAGNOSIS — N184 Chronic kidney disease, stage 4 (severe): Secondary | ICD-10-CM | POA: Diagnosis not present

## 2017-09-25 DIAGNOSIS — M170 Bilateral primary osteoarthritis of knee: Secondary | ICD-10-CM | POA: Diagnosis not present

## 2017-09-25 DIAGNOSIS — M5412 Radiculopathy, cervical region: Secondary | ICD-10-CM | POA: Diagnosis not present

## 2017-09-25 DIAGNOSIS — M9983 Other biomechanical lesions of lumbar region: Secondary | ICD-10-CM | POA: Diagnosis not present

## 2017-09-25 DIAGNOSIS — Z96612 Presence of left artificial shoulder joint: Secondary | ICD-10-CM | POA: Diagnosis not present

## 2017-09-25 DIAGNOSIS — M1990 Unspecified osteoarthritis, unspecified site: Secondary | ICD-10-CM | POA: Diagnosis not present

## 2017-10-03 DIAGNOSIS — Z1331 Encounter for screening for depression: Secondary | ICD-10-CM | POA: Diagnosis not present

## 2017-10-03 DIAGNOSIS — I129 Hypertensive chronic kidney disease with stage 1 through stage 4 chronic kidney disease, or unspecified chronic kidney disease: Secondary | ICD-10-CM | POA: Diagnosis not present

## 2017-10-03 DIAGNOSIS — N183 Chronic kidney disease, stage 3 (moderate): Secondary | ICD-10-CM | POA: Diagnosis not present

## 2017-10-03 DIAGNOSIS — E785 Hyperlipidemia, unspecified: Secondary | ICD-10-CM | POA: Diagnosis not present

## 2017-10-03 DIAGNOSIS — R609 Edema, unspecified: Secondary | ICD-10-CM | POA: Diagnosis not present

## 2017-10-03 DIAGNOSIS — E119 Type 2 diabetes mellitus without complications: Secondary | ICD-10-CM | POA: Diagnosis not present

## 2017-10-03 DIAGNOSIS — Z9181 History of falling: Secondary | ICD-10-CM | POA: Diagnosis not present

## 2017-10-03 DIAGNOSIS — F329 Major depressive disorder, single episode, unspecified: Secondary | ICD-10-CM | POA: Diagnosis not present

## 2017-10-03 DIAGNOSIS — E039 Hypothyroidism, unspecified: Secondary | ICD-10-CM | POA: Diagnosis not present

## 2017-10-22 ENCOUNTER — Ambulatory Visit: Payer: Self-pay | Admitting: Orthopedic Surgery

## 2017-10-25 DIAGNOSIS — M170 Bilateral primary osteoarthritis of knee: Secondary | ICD-10-CM | POA: Diagnosis not present

## 2017-10-25 DIAGNOSIS — N184 Chronic kidney disease, stage 4 (severe): Secondary | ICD-10-CM | POA: Diagnosis not present

## 2017-10-25 DIAGNOSIS — M9983 Other biomechanical lesions of lumbar region: Secondary | ICD-10-CM | POA: Diagnosis not present

## 2017-10-25 DIAGNOSIS — S42292D Other displaced fracture of upper end of left humerus, subsequent encounter for fracture with routine healing: Secondary | ICD-10-CM | POA: Diagnosis not present

## 2017-10-25 DIAGNOSIS — M5136 Other intervertebral disc degeneration, lumbar region: Secondary | ICD-10-CM | POA: Diagnosis not present

## 2017-10-25 DIAGNOSIS — Z96612 Presence of left artificial shoulder joint: Secondary | ICD-10-CM | POA: Diagnosis not present

## 2017-10-25 DIAGNOSIS — M1990 Unspecified osteoarthritis, unspecified site: Secondary | ICD-10-CM | POA: Diagnosis not present

## 2017-10-25 DIAGNOSIS — M5412 Radiculopathy, cervical region: Secondary | ICD-10-CM | POA: Diagnosis not present

## 2017-11-06 ENCOUNTER — Ambulatory Visit: Payer: Self-pay | Admitting: Orthopedic Surgery

## 2017-11-06 NOTE — H&P (Signed)
TOTAL KNEE ADMISSION H&P  Patient is being admitted for right total knee arthroplasty.  Subjective:  Chief Complaint:right knee pain.  HPI: [place], 67 y.o[place], has a history of pain and functional disability in the right knee due to arthritis and has failed non-surgical conservative treatments for greater than 12 weeks to includeNSAID's and/or analgesics, corticosteriod injections, viscosupplementation injections, flexibility and strengthening excercises, use of assistive devices, weight reduction as appropriate and activity modification.  Onset of symptoms was gradual, starting >10 years ago with gradually worsening course since that time. The patient noted no past surgery on the right knee(s).  Patient currently rates pain in the right knee(s) at 10 out of 10 with activity. Patient has night pain, worsening of pain with activity and weight bearing, pain that interferes with activities of daily living, pain with passive range of motion, crepitus and joint swelling.  Patient has evidence of subchondral cysts, subchondral sclerosis, periarticular osteophytes and joint space narrowing by imaging studies. There is no active infection.  Patient Active Problem List   Diagnosis Date Noted  . Humerus fracture 03/09/2017  . Asthma 03/09/2017  . Hypothyroidism 03/09/2017  . 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 Genetic testing 02/06/2017  . Acute respiratory failure with hypoxia ([place]) 06/07/2016  . RSV (acute bronchiolitis due to respiratory syncytial virus) 06/07/2016  . Anxiety 06/07/2016  . Arthritis   . Depression 05/30/2016  . Chronic kidney disease (CKD), stage IV (severe) ([place]) 05/30/2016  . Mild intermittent asthma with acute exacerbation   . Respiratory distress   . CKD (chronic kidney disease) stage 3, GFR 30-59 ml/min (HCC)   . Chronic pain syndrome   . Bronchitis 05/29/2016  . Left shoulder pain   . Opacity of lung on imaging study   .  Shoulder pain, acute   . Weakness generalized 12/22/2015  . Essential hypertension 12/22/2015  . Pneumonia 12/22/2015  . PVC's (premature ventricular contractions) 12/22/2015  . CAP (community acquired pneumonia) 12/21/2015   Past Medical History:  Diagnosis Date  . Acute respiratory failure with hypoxia ([place]) 06/07/2016  . Anemia    as a child  . Anxiety 06/07/2016  . Arthritis   . Asthma    bronchitis  . CAP (community acquired pneumonia) 12/21/2015  . Cervical radiculopathy   . Chronic kidney disease (CKD), stage IV (severe) ([place]) 05/30/2016  . Chronic pain in left shoulder   . CKD (chronic kidney disease)    pt not aware of this  . CKD (chronic kidney disease) stage 3, GFR 30-59 ml/min (HCC)   . Depression   . Hypertension   . Hypothyroidism   . Pneumonia   . Pre-diabetes   . PVC's (premature ventricular contractions) 12/22/2015  . 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 APPENDECTOMY    . REVERSE SHOULDER ARTHROPLASTY Left 03/01/2017   Procedure: REVERSE SHOULDER ARTHROPLASTY;  Surgeon: Justice Britain, MD;  Location: [facility];  Service: Orthopedics;  Laterality: Left;  . TUBAL LIGATION      Current Outpatient Medications  Medication Sig Dispense Refill Last Dose  . albuterol (PROVENTIL HFA;VENTOLIN HFA) 108 (90 Base) MCG/ACT inhaler Inhale 2 puffs into the lungs every 4 (four) hours as needed for wheezing or shortness of breath. (Patient not taking: Reported on 11/05/2017) 18 g 0 Not Taking at Unknown time  . amLODipine (NORVASC) 10 MG tablet Take 1 tablet (10 mg total) by mouth daily. (Patient  not taking: Reported on 11/05/2017) 30 tablet 0 Not Taking at Unknown time  . benzonatate (TESSALON) 200 MG capsule Take 1 capsule (200 mg total) by mouth 3 (three) times daily as needed for cough. (Patient not taking: Reported on 11/05/2017) 20 capsule 0 Completed Course at Unknown time  . escitalopram (LEXAPRO) 10 MG  tablet Take 10 mg daily by mouth.   Taking  . fluticasone (FLONASE) 50 MCG/ACT nasal spray Place 2 sprays into both nostrils daily. (Patient not taking: Reported on 11/05/2017) 16 g 0 Completed Course at Unknown time  . furosemide (LASIX) 20 MG tablet Take 20 mg by mouth daily.    Taking  . guaiFENesin (MUCINEX) 600 MG 12 hr tablet Take 1 tablet (600 mg total) by mouth 2 (two) times daily. (Patient not taking: Reported on 11/05/2017) 15 tablet 0 Not Taking at Unknown time  . levothyroxine (SYNTHROID, LEVOTHROID) 75 MCG tablet Take 75 mcg daily before breakfast by mouth.   Taking  . lisinopril (PRINIVIL,ZESTRIL) 10 MG tablet Take 2 tablets (20 mg total) by mouth daily.   Taking  . loratadine (CLARITIN) 10 MG tablet Take 1 tablet (10 mg total) by mouth daily. (Patient not taking: Reported on 11/05/2017) 10 tablet 0 Not Taking at Unknown time  . pravastatin (PRAVACHOL) 20 MG tablet Take 20 mg by mouth 2 (two) times a week.     . traMADol (ULTRAM) 50 MG tablet Take 1 tablet (50 mg total) by mouth every 6 (six) hours as needed for moderate pain. 15 tablet 0 Taking   No current facility-administered medications for this visit.    No Known Allergies  Social History   Tobacco Use  . Smoking status: Never Smoker  . Smokeless tobacco: Never Used  Substance Use Topics  . Alcohol use: No    Family History  Problem Relation Age of Onset  . Hypertension Other   . Breast cancer Mother 77  . Breast cancer Sister 74  . Lung cancer Brother   . Cancer Sister 52       "female" cancer  . Cancer Sister        unknown cancer  . Breast cancer Sister 9     Review of Systems  Constitutional: Negative.   HENT: Negative.   Eyes: Negative.   Respiratory: Negative.   Cardiovascular: Negative.   Gastrointestinal: Negative.   Genitourinary: Negative.   Musculoskeletal: Positive for joint pain.  Skin: Negative.   Neurological: Negative.   Endo/Heme/Allergies: Negative.   Psychiatric/Behavioral: Negative.      Objective:  Physical Exam  Vital signs in last 24 hours: @VSRANGES @  Labs:   Estimated body mass index is 46.48 kg/m as calculated from the following:   Height as of 03/22/17: 5\' 6"  (1.676 m).   Weight as of 03/22/17: 130.6 kg (288 lb).   Imaging Review Plain radiographs demonstrate severe degenerative joint disease of the bilaterally knee(s). The overall alignment issignificant valgus. The bone quality appears to be adequate for age and reported activity level.   Preoperative templating of the joint replacement has been completed, documented, and submitted to the Operating Room personnel in order to optimize intra-operative equipment management.   Anticipated LOS equal to or greater than 2 midnights due to - Age 18 and older with one or more of the following:  - Obesity  - Expected need for hospital services (PT, OT, Nursing) required for safe  discharge  - Anticipated need for postoperative skilled nursing care or inpatient rehab  - Active co-morbidities:  CKD OR   - Unanticipated findings during/Post Surgery: None  - Patient is a high risk of re-admission due to: None     Assessment/Plan:  End stage arthritis, right knee   The patient history, physical examination, clinical judgment of the provider and imaging studies are consistent with end stage degenerative joint disease of the right knee(s) and total knee arthroplasty is deemed medically necessary. The treatment options including medical management, injection therapy arthroscopy and arthroplasty were discussed at length. The risks and benefits of total knee arthroplasty were presented and reviewed. The risks due to aseptic loosening, infection, stiffness, patella tracking problems, thromboembolic complications and other imponderables were discussed. The patient acknowledged the explanation, agreed to proceed with the plan and consent was signed. Patient is being admitted for inpatient treatment for surgery, pain  control, PT, OT, prophylactic antibiotics, VTE prophylaxis, progressive ambulation and ADL's and discharge planning. The patient is planning to be discharged home with outpatient PT @ [facility]. Has DME.

## 2017-11-06 NOTE — H&P (View-Only) (Signed)
TOTAL KNEE ADMISSION H&P  Patient is being admitted for right total knee arthroplasty.  Subjective:  Chief Complaint:right knee pain.  HPI: [place], 67 y.o. female, has a history of pain and functional disability in the right knee due to arthritis and has failed non-surgical conservative treatments for greater than 12 weeks to includeNSAID's and/or analgesics, corticosteriod injections, viscosupplementation injections, flexibility and strengthening excercises, use of assistive devices, weight reduction as appropriate and activity modification.  Onset of symptoms was gradual, starting >10 years ago with gradually worsening course since that time. The patient noted no past surgery on the right knee(s).  Patient currently rates pain in the right knee(s) at 10 out of 10 with activity. Patient has night pain, worsening of pain with activity and weight bearing, pain that interferes with activities of daily living, pain with passive range of motion, crepitus and joint swelling.  Patient has evidence of subchondral cysts, subchondral sclerosis, periarticular osteophytes and joint space narrowing by imaging studies. There is no active infection.  Patient Active Problem List   Diagnosis Date Noted  . Humerus fracture 03/09/2017  . Asthma 03/09/2017  . Hypothyroidism 03/09/2017  . 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 Genetic testing 02/06/2017  . Acute respiratory failure with hypoxia ([place]) 06/07/2016  . RSV (acute bronchiolitis due to respiratory syncytial virus) 06/07/2016  . Anxiety 06/07/2016  . Arthritis   . Depression 05/30/2016  . Chronic kidney disease (CKD), stage IV (severe) ([place]) 05/30/2016  . Mild intermittent asthma with acute exacerbation   . Respiratory distress   . CKD (chronic kidney disease) stage 3, GFR 30-59 ml/min (HCC)   . Chronic pain syndrome   . Bronchitis 05/29/2016  . Left shoulder pain   . Opacity of lung on imaging study   .  Shoulder pain, acute   . Weakness generalized 12/22/2015  . Essential hypertension 12/22/2015  . Pneumonia 12/22/2015  . PVC's (premature ventricular contractions) 12/22/2015  . CAP (community acquired pneumonia) 12/21/2015   Past Medical History:  Diagnosis Date  . Acute respiratory failure with hypoxia ([place]) 06/07/2016  . Anemia    as a child  . Anxiety 06/07/2016  . Arthritis   . Asthma    bronchitis  . CAP (community acquired pneumonia) 12/21/2015  . Cervical radiculopathy   . Chronic kidney disease (CKD), stage IV (severe) ([place]) 05/30/2016  . Chronic pain in left shoulder   . CKD (chronic kidney disease)    pt not aware of this  . CKD (chronic kidney disease) stage 3, GFR 30-59 ml/min (HCC)   . Depression   . Hypertension   . Hypothyroidism   . Pneumonia   . Pre-diabetes   . PVC's (premature ventricular contractions) 12/22/2015  . 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 APPENDECTOMY    . REVERSE SHOULDER ARTHROPLASTY Left 03/01/2017   Procedure: REVERSE SHOULDER ARTHROPLASTY;  Surgeon: Justice Britain, MD;  Location: [facility];  Service: Orthopedics;  Laterality: Left;  . TUBAL LIGATION      Current Outpatient Medications  Medication Sig Dispense Refill Last Dose  . albuterol (PROVENTIL HFA;VENTOLIN HFA) 108 (90 Base) MCG/ACT inhaler Inhale 2 puffs into the lungs every 4 (four) hours as needed for wheezing or shortness of breath. (Patient not taking: Reported on 11/05/2017) 18 g 0 Not Taking at Unknown time  . amLODipine (NORVASC) 10 MG tablet Take 1 tablet (10 mg total) by mouth daily. (Patient  not taking: Reported on 11/05/2017) 30 tablet 0 Not Taking at Unknown time  . benzonatate (TESSALON) 200 MG capsule Take 1 capsule (200 mg total) by mouth 3 (three) times daily as needed for cough. (Patient not taking: Reported on 11/05/2017) 20 capsule 0 Completed Course at Unknown time  . escitalopram (LEXAPRO) 10 MG  tablet Take 10 mg daily by mouth.   Taking  . fluticasone (FLONASE) 50 MCG/ACT nasal spray Place 2 sprays into both nostrils daily. (Patient not taking: Reported on 11/05/2017) 16 g 0 Completed Course at Unknown time  . furosemide (LASIX) 20 MG tablet Take 20 mg by mouth daily.    Taking  . guaiFENesin (MUCINEX) 600 MG 12 hr tablet Take 1 tablet (600 mg total) by mouth 2 (two) times daily. (Patient not taking: Reported on 11/05/2017) 15 tablet 0 Not Taking at Unknown time  . levothyroxine (SYNTHROID, LEVOTHROID) 75 MCG tablet Take 75 mcg daily before breakfast by mouth.   Taking  . lisinopril (PRINIVIL,ZESTRIL) 10 MG tablet Take 2 tablets (20 mg total) by mouth daily.   Taking  . loratadine (CLARITIN) 10 MG tablet Take 1 tablet (10 mg total) by mouth daily. (Patient not taking: Reported on 11/05/2017) 10 tablet 0 Not Taking at Unknown time  . pravastatin (PRAVACHOL) 20 MG tablet Take 20 mg by mouth 2 (two) times a week.     . traMADol (ULTRAM) 50 MG tablet Take 1 tablet (50 mg total) by mouth every 6 (six) hours as needed for moderate pain. 15 tablet 0 Taking   No current facility-administered medications for this visit.    No Known Allergies  Social History   Tobacco Use  . Smoking status: Never Smoker  . Smokeless tobacco: Never Used  Substance Use Topics  . Alcohol use: No    Family History  Problem Relation Age of Onset  . Hypertension Other   . Breast cancer Mother 15  . Breast cancer Sister 45  . Lung cancer Brother   . Cancer Sister 5       "female" cancer  . Cancer Sister        unknown cancer  . Breast cancer Sister 52     Review of Systems  Constitutional: Negative.   HENT: Negative.   Eyes: Negative.   Respiratory: Negative.   Cardiovascular: Negative.   Gastrointestinal: Negative.   Genitourinary: Negative.   Musculoskeletal: Positive for joint pain.  Skin: Negative.   Neurological: Negative.   Endo/Heme/Allergies: Negative.   Psychiatric/Behavioral: Negative.      Objective:  Physical Exam  Vital signs in last 24 hours: @VSRANGES @  Labs:   Estimated body mass index is 46.48 kg/m as calculated from the following:   Height as of 03/22/17: 5\' 6"  (1.676 m).   Weight as of 03/22/17: 130.6 kg (288 lb).   Imaging Review Plain radiographs demonstrate severe degenerative joint disease of the bilaterally knee(s). The overall alignment issignificant valgus. The bone quality appears to be adequate for age and reported activity level.   Preoperative templating of the joint replacement has been completed, documented, and submitted to the Operating Room personnel in order to optimize intra-operative equipment management.   Anticipated LOS equal to or greater than 2 midnights due to - Age 34 and older with one or more of the following:  - Obesity  - Expected need for hospital services (PT, OT, Nursing) required for safe  discharge  - Anticipated need for postoperative skilled nursing care or inpatient rehab  - Active co-morbidities:  CKD OR   - Unanticipated findings during/Post Surgery: None  - Patient is a high risk of re-admission due to: None     Assessment/Plan:  End stage arthritis, right knee   The patient history, physical examination, clinical judgment of the provider and imaging studies are consistent with end stage degenerative joint disease of the right knee(s) and total knee arthroplasty is deemed medically necessary. The treatment options including medical management, injection therapy arthroscopy and arthroplasty were discussed at length. The risks and benefits of total knee arthroplasty were presented and reviewed. The risks due to aseptic loosening, infection, stiffness, patella tracking problems, thromboembolic complications and other imponderables were discussed. The patient acknowledged the explanation, agreed to proceed with the plan and consent was signed. Patient is being admitted for inpatient treatment for surgery, pain  control, PT, OT, prophylactic antibiotics, VTE prophylaxis, progressive ambulation and ADL's and discharge planning. The patient is planning to be discharged home with outpatient PT @ [facility]. Has DME.

## 2017-11-07 NOTE — Patient Instructions (Addendum)
Cheryl Lane  11/07/2017   Your procedure is scheduled on: 11-15-17   Report to [facility] Main  Entrance    Report to admitting at 5:30AM    Call this number if you have problems the morning of surgery [phone]     Remember: Do not eat food or drink liquids :After Midnight.     Take these medicines the morning of surgery with A SIP OF WATER: ESCITALOPRAM, LEVOTHYROXINE,TRAMADOL IF NEEDED                                You may not have any metal on your body including hair pins and              piercings  Do not wear jewelry, make-up, lotions, powders or perfumes, deodorant             Do not wear nail polish.  Do not shave  48 hours prior to surgery.             Do not bring valuables to the hospital. [place].  Contacts, dentures or bridgework may not be worn into surgery.  Leave suitcase in the car. After surgery it may be brought to your room.                  Please read over the following fact sheets you were given: _____________________________________________________________________             [facility] - Preparing for Surgery Before surgery, you can play an important role.  Because skin is not sterile, your skin needs to be as free of germs as possible.  You can reduce the number of germs on your skin by washing with CHG (chlorahexidine gluconate) soap before surgery.  CHG is an antiseptic cleaner which kills germs and bonds with the skin to continue killing germs even after washing. Please DO NOT use if you have an allergy to CHG or antibacterial soaps.  If your skin becomes reddened/irritated stop using the CHG and inform your nurse when you arrive at Short Stay. Do not shave (including legs and underarms) for at least 48 hours prior to the first CHG shower.  You may shave your face/neck. Please follow these instructions carefully:  1.  Shower with CHG Soap the night before  surgery and the  morning of Surgery.  2.  If you choose to wash your hair, wash your hair first as usual with your  normal  shampoo.  3.  After you shampoo, rinse your hair and body thoroughly to remove the  shampoo.                           4.  Use CHG as you would any other liquid soap.  You can apply chg directly  to the skin and wash                       Gently with a scrungie or clean washcloth.  5.  Apply the CHG Soap to your body ONLY FROM THE NECK DOWN.   Do not use on face/ open  Wound or open sores. Avoid contact with eyes, ears mouth and genitals (private parts).                       Wash face,  Genitals (private parts) with your normal soap.             6.  Wash thoroughly, paying special attention to the area where your surgery  will be performed.  7.  Thoroughly rinse your body with warm water from the neck down.  8.  DO NOT shower/wash with your normal soap after using and rinsing off  the CHG Soap.                9.  Pat yourself dry with a clean towel.            10.  Wear clean pajamas.            11.  Place clean sheets on your bed the night of your first shower and do not  sleep with pets. Day of Surgery : Do not apply any lotions/deodorants the morning of surgery.  Please wear clean clothes to the hospital/surgery center.  FAILURE TO FOLLOW THESE INSTRUCTIONS MAY RESULT IN THE CANCELLATION OF YOUR SURGERY PATIENT SIGNATURE_________________________________  NURSE SIGNATURE__________________________________  ________________________________________________________________________   Adam Phenix  An incentive spirometer is a tool that can help keep your lungs clear and active. This tool measures how well you are filling your lungs with each breath. Taking long deep breaths may help reverse or decrease the chance of developing breathing (pulmonary) problems (especially infection) following:  A long period of time when you are unable to  move or be active. BEFORE THE PROCEDURE   If the spirometer includes an indicator to show your best effort, your nurse or respiratory therapist will set it to a desired goal.  If possible, sit up straight or lean slightly forward. Try not to slouch.  Hold the incentive spirometer in an upright position. INSTRUCTIONS FOR USE  1. Sit on the edge of your bed if possible, or sit up as far as you can in bed or on a chair. 2. Hold the incentive spirometer in an upright position. 3. Breathe out normally. 4. Place the mouthpiece in your mouth and seal your lips tightly around it. 5. Breathe in slowly and as deeply as possible, raising the piston or the ball toward the top of the column. 6. Hold your breath for 3-5 seconds or for as long as possible. Allow the piston or ball to fall to the bottom of the column. 7. Remove the mouthpiece from your mouth and breathe out normally. 8. Rest for a few seconds and repeat Steps 1 through 7 at least 10 times every 1-2 hours when you are awake. Take your time and take a few normal breaths between deep breaths. 9. The spirometer may include an indicator to show your best effort. Use the indicator as a goal to work toward during each repetition. 10. After each set of 10 deep breaths, practice coughing to be sure your lungs are clear. If you have an incision (the cut made at the time of surgery), support your incision when coughing by placing a pillow or rolled up towels firmly against it. Once you are able to get out of bed, walk around indoors and cough well. You may stop using the incentive spirometer when instructed by your caregiver.  RISKS AND COMPLICATIONS  Take your time so you do not get  dizzy or light-headed.  If you are in pain, you may need to take or ask for pain medication before doing incentive spirometry. It is harder to take a deep breath if you are having pain. AFTER USE  Rest and breathe slowly and easily.  It can be helpful to keep track of  a log of your progress. Your caregiver can provide you with a simple table to help with this. If you are using the spirometer at home, follow these instructions: [place] IF:   You are having difficultly using the spirometer.  You have trouble using the spirometer as often as instructed.  Your pain medication is not giving enough relief while using the spirometer.  You develop fever of 100.5 F (38.1 C) or higher. SEEK IMMEDIATE MEDICAL CARE IF:   You cough up bloody sputum that had not been present before.  You develop fever of 102 F (38.9 C) or greater.  You develop worsening pain at or near the incision site. MAKE SURE YOU:   Understand these instructions.  Will watch your condition.  Will get help right away if you are not doing well or get worse. Document Released: 08/21/2006 Document Revised: 07/03/2011 Document Reviewed: 10/22/2006 ExitCare Patient Information 2014 ExitCare, [place].   ________________________________________________________________________  WHAT IS A BLOOD TRANSFUSION? Blood Transfusion Information  A transfusion is the replacement of blood or some of its parts. Blood is made up of multiple cells which provide different functions.  Red blood cells carry oxygen and are used for blood loss replacement.  White blood cells fight against infection.  Platelets control bleeding.  Plasma helps clot blood.  Other blood products are available for specialized needs, such as hemophilia or other clotting disorders. BEFORE THE TRANSFUSION  Who gives blood for transfusions?   Healthy volunteers who are fully evaluated to make sure their blood is safe. This is blood bank blood. Transfusion therapy is the safest it has ever been in the practice of medicine. Before blood is taken from a donor, a complete history is taken to make sure that person has no history of diseases nor engages in risky social behavior (examples are intravenous drug use or sexual  activity with multiple partners). The donor's travel history is screened to minimize risk of transmitting infections, such as malaria. The donated blood is tested for signs of infectious diseases, such as HIV and hepatitis. The blood is then tested to be sure it is compatible with you in order to minimize the chance of a transfusion reaction. If you or a relative donates blood, this is often done in anticipation of surgery and is not appropriate for emergency situations. It takes many days to process the donated blood. RISKS AND COMPLICATIONS Although transfusion therapy is very safe and saves many lives, the main dangers of transfusion include:   Getting an infectious disease.  Developing a transfusion reaction. This is an allergic reaction to something in the blood you were given. Every precaution is taken to prevent this. The decision to have a blood transfusion has been considered carefully by your caregiver before blood is given. Blood is not given unless the benefits outweigh the risks. AFTER THE TRANSFUSION  Right after receiving a blood transfusion, you will usually feel much better and more energetic. This is especially true if your red blood cells have gotten low (anemic). The transfusion raises the level of the red blood cells which carry oxygen, and this usually causes an energy increase.  The nurse administering the transfusion will  monitor you carefully for complications. HOME CARE INSTRUCTIONS  No special instructions are needed after a transfusion. You may find your energy is better. Speak with your caregiver about any limitations on activity for underlying diseases you may have. SEEK MEDICAL CARE IF:   Your condition is not improving after your transfusion.  You develop redness or irritation at the intravenous (IV) site. SEEK IMMEDIATE MEDICAL CARE IF:  Any of the following symptoms occur over the next 12 hours:  Shaking chills.  You have a temperature by mouth above 102 F  (38.9 C), not controlled by medicine.  Chest, back, or muscle pain.  People around you feel you are not acting correctly or are confused.  Shortness of breath or difficulty breathing.  Dizziness and fainting.  You get a rash or develop hives.  You have a decrease in urine output.  Your urine turns a dark color or changes to pink, red, or brown. Any of the following symptoms occur over the next 10 days:  You have a temperature by mouth above 102 F (38.9 C), not controlled by medicine.  Shortness of breath.  Weakness after normal activity.  The white part of the eye turns yellow (jaundice).  You have a decrease in the amount of urine or are urinating less often.  Your urine turns a dark color or changes to pink, red, or brown. Document Released: 04/07/2000 Document Revised: 07/03/2011 Document Reviewed: 11/25/2007 [facility] Patient Information 2014 [place], [place].  _______________________________________________________________________

## 2017-11-07 NOTE — Progress Notes (Signed)
Echo 12-23-15 Epic

## 2017-11-08 ENCOUNTER — Encounter (HOSPITAL_COMMUNITY)
Admission: RE | Admit: 2017-11-08 | Discharge: 2017-11-08 | Disposition: A | Discharge status: Home / Self Care | Payer: Medicare HMO | Source: Ambulatory Visit | Attending: Orthopedic Surgery | Admitting: Orthopedic Surgery

## 2017-11-08 ENCOUNTER — Encounter (HOSPITAL_COMMUNITY): Payer: Self-pay

## 2017-11-08 ENCOUNTER — Other Ambulatory Visit: Payer: Self-pay

## 2017-11-08 DIAGNOSIS — I10 Essential (primary) hypertension: Secondary | ICD-10-CM | POA: Diagnosis not present

## 2017-11-08 DIAGNOSIS — Z0183 Encounter for blood typing: Secondary | ICD-10-CM | POA: Diagnosis not present

## 2017-11-08 DIAGNOSIS — Z01812 Encounter for preprocedural laboratory examination: Secondary | ICD-10-CM | POA: Diagnosis not present

## 2017-11-08 DIAGNOSIS — M1711 Unilateral primary osteoarthritis, right knee: Secondary | ICD-10-CM | POA: Insufficient documentation

## 2017-11-08 LAB — BASIC METABOLIC PANEL
ANION GAP: 8 (ref 5–15)
BUN: 32 mg/dL — ABNORMAL HIGH (ref 8–23)
CALCIUM: 9.5 mg/dL (ref 8.9–10.3)
CHLORIDE: 105 mmol/L (ref 98–111)
CO2: 29 mmol/L (ref 22–32)
Creatinine, Ser: 1.27 mg/dL — ABNORMAL HIGH (ref 0.44–1.00)
GFR calc non Af Amer: 42 mL/min — ABNORMAL LOW (ref >60)
GFR, EST AFRICAN AMERICAN: 49 mL/min — AB (ref >60)
GLUCOSE: 119 mg/dL — AB (ref 70–99)
POTASSIUM: 4.3 mmol/L (ref 3.5–5.1)
Sodium: 142 mmol/L (ref 135–145)

## 2017-11-08 LAB — CBC
HEMATOCRIT: 39.2 % (ref 36.0–46.0)
HEMOGLOBIN: 12.5 g/dL (ref 12.0–15.0)
MCH: 29.1 pg (ref 26.0–34.0)
MCHC: 31.9 g/dL (ref 30.0–36.0)
MCV: 91.4 fL (ref 78.0–100.0)
Platelets: 284 10*3/uL (ref 150–400)
RBC: 4.29 MIL/uL (ref 3.87–5.11)
RDW: 14.1 % (ref 11.5–15.5)
WBC: 4.4 10*3/uL (ref 4.0–10.5)

## 2017-11-08 LAB — SURGICAL PCR SCREEN
MRSA, PCR: NEGATIVE
Staphylococcus aureus: NEGATIVE

## 2017-11-08 LAB — ABO/RH: ABO/RH(D): B POS

## 2017-11-08 NOTE — Progress Notes (Signed)
EKG  from [place] [facility] 10-03-17 on chart

## 2017-11-08 NOTE — Progress Notes (Signed)
SPOKE WITH HOPE AT [place] PATIENT LAST EKG

## 2017-11-08 NOTE — Progress Notes (Signed)
BMP ROUTED VIA Epic TO DR Lyla Glassing

## 2017-11-14 ENCOUNTER — Encounter (HOSPITAL_COMMUNITY): Payer: Self-pay | Admitting: Anesthesiology

## 2017-11-14 NOTE — Anesthesia Preprocedure Evaluation (Addendum)
Anesthesia Evaluation  Patient identified by MRN, date of birth, ID band Patient awake    Reviewed: Allergy & Precautions, NPO status , Patient's Chart, lab work & pertinent test results  Airway Mallampati: II  TM Distance: >3 FB Neck ROM: Full    Dental  (+) Missing, Edentulous Upper, Edentulous Lower   Pulmonary asthma , pneumonia, resolved,    Pulmonary exam normal breath sounds clear to auscultation       Cardiovascular hypertension, Pt. on medications Normal cardiovascular exam Rhythm:Regular Rate:Normal     Neuro/Psych PSYCHIATRIC DISORDERS Anxiety Depression  Neuromuscular disease    GI/Hepatic negative GI ROS, Neg liver ROS,   Endo/Other  Hypothyroidism   Renal/GU Renal InsufficiencyRenal disease  negative genitourinary   Musculoskeletal  (+) Arthritis , Osteoarthritis,  DJD right knee Cervical radiculopathy   Abdominal (+) + obese,   Peds  Hematology  (+) anemia ,   Anesthesia Other Findings   Reproductive/Obstetrics                            Anesthesia Physical Anesthesia Plan  ASA: III  Anesthesia Plan: Spinal   Post-op Pain Management:  Regional for Post-op pain   Induction:   PONV Risk Score and Plan: 3 and Ondansetron, Dexamethasone and Treatment may vary due to age or medical condition  Airway Management Planned: Natural Airway and Simple Face Mask  Additional Equipment:   Intra-op Plan:   Post-operative Plan:   Informed Consent: I have reviewed the patients History and Physical, chart, labs and discussed the procedure including the risks, benefits and alternatives for the proposed anesthesia with the patient or authorized representative who has indicated his/her understanding and acceptance.   Dental advisory given  Plan Discussed with: CRNA and Surgeon  Anesthesia Plan Comments:        Anesthesia Quick Evaluation

## 2017-11-15 ENCOUNTER — Inpatient Hospital Stay (HOSPITAL_COMMUNITY): Payer: Medicare HMO | Admitting: Anesthesiology

## 2017-11-15 ENCOUNTER — Inpatient Hospital Stay (HOSPITAL_COMMUNITY): Payer: Medicare HMO

## 2017-11-15 ENCOUNTER — Encounter (HOSPITAL_COMMUNITY): Payer: Self-pay | Admitting: Certified Registered Nurse Anesthetist

## 2017-11-15 ENCOUNTER — Encounter (HOSPITAL_COMMUNITY)
Admission: RE | Disposition: A | Discharge status: Home / Self Care | Payer: Self-pay | Source: Ambulatory Visit | Attending: Orthopedic Surgery

## 2017-11-15 ENCOUNTER — Other Ambulatory Visit: Payer: Self-pay

## 2017-11-15 ENCOUNTER — Inpatient Hospital Stay (HOSPITAL_COMMUNITY)
Admission: RE | Admit: 2017-11-15 | Discharge: 2017-11-17 | DRG: 470 | Disposition: A | Discharge status: Home / Self Care | Payer: Medicare HMO | Source: Ambulatory Visit | Attending: Orthopedic Surgery | Admitting: Orthopedic Surgery

## 2017-11-15 DIAGNOSIS — E669 Obesity, unspecified: Secondary | ICD-10-CM | POA: Diagnosis present

## 2017-11-15 DIAGNOSIS — Z6841 Body Mass Index (BMI) 40.0 and over, adult: Secondary | ICD-10-CM | POA: Diagnosis not present

## 2017-11-15 DIAGNOSIS — I10 Essential (primary) hypertension: Secondary | ICD-10-CM | POA: Diagnosis not present

## 2017-11-15 DIAGNOSIS — M1711 Unilateral primary osteoarthritis, right knee: Secondary | ICD-10-CM | POA: Diagnosis not present

## 2017-11-15 DIAGNOSIS — Z96651 Presence of right artificial knee joint: Secondary | ICD-10-CM | POA: Diagnosis not present

## 2017-11-15 DIAGNOSIS — I129 Hypertensive chronic kidney disease with stage 1 through stage 4 chronic kidney disease, or unspecified chronic kidney disease: Secondary | ICD-10-CM | POA: Diagnosis present

## 2017-11-15 DIAGNOSIS — E039 Hypothyroidism, unspecified: Secondary | ICD-10-CM | POA: Diagnosis present

## 2017-11-15 DIAGNOSIS — N184 Chronic kidney disease, stage 4 (severe): Secondary | ICD-10-CM | POA: Diagnosis not present

## 2017-11-15 DIAGNOSIS — J45909 Unspecified asthma, uncomplicated: Secondary | ICD-10-CM | POA: Diagnosis not present

## 2017-11-15 DIAGNOSIS — Z79899 Other long term (current) drug therapy: Secondary | ICD-10-CM

## 2017-11-15 DIAGNOSIS — Z7951 Long term (current) use of inhaled steroids: Secondary | ICD-10-CM

## 2017-11-15 DIAGNOSIS — G8918 Other acute postprocedural pain: Secondary | ICD-10-CM | POA: Diagnosis not present

## 2017-11-15 DIAGNOSIS — R7303 Prediabetes: Secondary | ICD-10-CM | POA: Diagnosis present

## 2017-11-15 DIAGNOSIS — Z471 Aftercare following joint replacement surgery: Secondary | ICD-10-CM | POA: Diagnosis not present

## 2017-11-15 DIAGNOSIS — Z96612 Presence of left artificial shoulder joint: Secondary | ICD-10-CM | POA: Diagnosis not present

## 2017-11-15 HISTORY — PX: KNEE ARTHROPLASTY: SHX992

## 2017-11-15 LAB — TYPE AND SCREEN
ABO/RH(D): B POS
ANTIBODY SCREEN: NEGATIVE

## 2017-11-15 SURGERY — ARTHROPLASTY, KNEE, TOTAL, USING IMAGELESS COMPUTER-ASSISTED NAVIGATION
Anesthesia: Spinal | Site: Knee | Laterality: Right

## 2017-11-15 MED ORDER — SODIUM CHLORIDE 0.9 % IJ SOLN
INTRAMUSCULAR | Status: AC
  Filled 2017-11-15: qty 50

## 2017-11-15 MED ORDER — DOCUSATE SODIUM 100 MG PO CAPS
100.0000 mg | ORAL_CAPSULE | Freq: Two times a day (BID) | ORAL | Status: DC
  Administered 2017-11-15 – 2017-11-17 (×4): 100 mg via ORAL
  Filled 2017-11-15 (×4): qty 1

## 2017-11-15 MED ORDER — LISINOPRIL 20 MG PO TABS
20.0000 mg | ORAL_TABLET | Freq: Every day | ORAL | Status: DC
  Administered 2017-11-16: 20 mg via ORAL
  Filled 2017-11-15 (×2): qty 1

## 2017-11-15 MED ORDER — BUPIVACAINE IN DEXTROSE 0.75-8.25 % IT SOLN
INTRATHECAL | Status: DC | PRN
  Administered 2017-11-15: 1.8 mL via INTRATHECAL

## 2017-11-15 MED ORDER — HYDROMORPHONE HCL 1 MG/ML IJ SOLN
0.2500 mg | INTRAMUSCULAR | Status: DC | PRN
  Administered 2017-11-15 (×2): 0.5 mg via INTRAVENOUS

## 2017-11-15 MED ORDER — CEFAZOLIN SODIUM-DEXTROSE 2-4 GM/100ML-% IV SOLN
2.0000 g | INTRAVENOUS | Status: AC
  Administered 2017-11-15: 2 g via INTRAVENOUS
  Filled 2017-11-15: qty 100

## 2017-11-15 MED ORDER — ACETAMINOPHEN 10 MG/ML IV SOLN
1000.0000 mg | INTRAVENOUS | Status: AC
  Administered 2017-11-15: 1000 mg via INTRAVENOUS
  Filled 2017-11-15: qty 100

## 2017-11-15 MED ORDER — ISOPROPYL ALCOHOL 70 % SOLN
Status: AC
  Filled 2017-11-15: qty 480

## 2017-11-15 MED ORDER — PROPOFOL 10 MG/ML IV BOLUS
INTRAVENOUS | Status: AC
  Filled 2017-11-15: qty 20

## 2017-11-15 MED ORDER — ONDANSETRON HCL 4 MG/2ML IJ SOLN: 4.0000 mg | Freq: Four times a day (QID) | INTRAMUSCULAR | Status: DC | PRN

## 2017-11-15 MED ORDER — POLYETHYLENE GLYCOL 3350 17 G PO PACK: 17.0000 g | PACK | Freq: Every day | ORAL | Status: DC | PRN

## 2017-11-15 MED ORDER — MIDAZOLAM HCL 2 MG/2ML IJ SOLN
INTRAMUSCULAR | Status: AC
  Filled 2017-11-15: qty 2

## 2017-11-15 MED ORDER — SENNA 8.6 MG PO TABS
1.0000 | ORAL_TABLET | Freq: Two times a day (BID) | ORAL | Status: DC
  Administered 2017-11-15 – 2017-11-17 (×4): 8.6 mg via ORAL
  Filled 2017-11-15 (×4): qty 1

## 2017-11-15 MED ORDER — CEFAZOLIN SODIUM-DEXTROSE 2-4 GM/100ML-% IV SOLN
2.0000 g | Freq: Four times a day (QID) | INTRAVENOUS | Status: AC
  Administered 2017-11-15 (×2): 2 g via INTRAVENOUS
  Filled 2017-11-15 (×2): qty 100

## 2017-11-15 MED ORDER — SODIUM CHLORIDE 0.9 % IR SOLN
Status: DC | PRN
  Administered 2017-11-15: 1000 mL

## 2017-11-15 MED ORDER — ALUM & MAG HYDROXIDE-SIMETH 200-200-20 MG/5ML PO SUSP: 30.0000 mL | ORAL | Status: DC | PRN

## 2017-11-15 MED ORDER — SODIUM CHLORIDE 0.9 % IR SOLN
Status: DC | PRN
  Administered 2017-11-15: 3000 mL

## 2017-11-15 MED ORDER — HYDROMORPHONE HCL 1 MG/ML IJ SOLN
INTRAMUSCULAR | Status: AC
  Administered 2017-11-15: 0.5 mg via INTRAVENOUS
  Filled 2017-11-15: qty 1

## 2017-11-15 MED ORDER — FUROSEMIDE 20 MG PO TABS
20.0000 mg | ORAL_TABLET | Freq: Every day | ORAL | Status: DC
  Administered 2017-11-16: 20 mg via ORAL
  Filled 2017-11-15 (×2): qty 1

## 2017-11-15 MED ORDER — METOCLOPRAMIDE HCL 5 MG/ML IJ SOLN: 5.0000 mg | Freq: Three times a day (TID) | INTRAMUSCULAR | Status: DC | PRN

## 2017-11-15 MED ORDER — LEVOTHYROXINE SODIUM 75 MCG PO TABS
75.0000 ug | ORAL_TABLET | Freq: Every day | ORAL | Status: DC
  Administered 2017-11-16 – 2017-11-17 (×2): 75 ug via ORAL
  Filled 2017-11-15 (×2): qty 1

## 2017-11-15 MED ORDER — FENTANYL CITRATE (PF) 100 MCG/2ML IJ SOLN
INTRAMUSCULAR | Status: AC
  Filled 2017-11-15: qty 2

## 2017-11-15 MED ORDER — EPHEDRINE SULFATE-NACL 50-0.9 MG/10ML-% IV SOSY
PREFILLED_SYRINGE | INTRAVENOUS | Status: DC | PRN
  Administered 2017-11-15 (×2): 5 mg via INTRAVENOUS

## 2017-11-15 MED ORDER — SODIUM CHLORIDE 0.9 % IV SOLN
INTRAVENOUS | Status: DC
  Administered 2017-11-15: 17:00:00 via INTRAVENOUS
  Administered 2017-11-15: 1000 mL via INTRAVENOUS

## 2017-11-15 MED ORDER — LACTATED RINGERS IV SOLN
INTRAVENOUS | Status: DC | PRN
  Administered 2017-11-15 (×2): via INTRAVENOUS

## 2017-11-15 MED ORDER — METHOCARBAMOL 1000 MG/10ML IJ SOLN
500.0000 mg | Freq: Four times a day (QID) | INTRAVENOUS | Status: DC | PRN
  Administered 2017-11-15: 500 mg via INTRAVENOUS
  Filled 2017-11-15: qty 550

## 2017-11-15 MED ORDER — KETOROLAC TROMETHAMINE 15 MG/ML IJ SOLN: 7.5000 mg | Freq: Four times a day (QID) | INTRAMUSCULAR | Status: DC | PRN

## 2017-11-15 MED ORDER — ISOPROPYL ALCOHOL 70 % SOLN
Status: DC | PRN
  Administered 2017-11-15: 1 via TOPICAL

## 2017-11-15 MED ORDER — METOCLOPRAMIDE HCL 5 MG/ML IJ SOLN: 10.0000 mg | Freq: Once | INTRAMUSCULAR | Status: DC | PRN

## 2017-11-15 MED ORDER — ONDANSETRON HCL 4 MG/2ML IJ SOLN
INTRAMUSCULAR | Status: AC
  Filled 2017-11-15: qty 2

## 2017-11-15 MED ORDER — ESCITALOPRAM OXALATE 10 MG PO TABS
10.0000 mg | ORAL_TABLET | Freq: Every day | ORAL | Status: DC
  Administered 2017-11-16 – 2017-11-17 (×2): 10 mg via ORAL
  Filled 2017-11-15 (×2): qty 1

## 2017-11-15 MED ORDER — SODIUM CHLORIDE 0.9 % IV SOLN: INTRAVENOUS | Status: DC

## 2017-11-15 MED ORDER — CHLORHEXIDINE GLUCONATE 4 % EX LIQD: 60.0000 mL | Freq: Once | CUTANEOUS | Status: DC

## 2017-11-15 MED ORDER — HYDROCODONE-ACETAMINOPHEN 7.5-325 MG PO TABS
1.0000 | ORAL_TABLET | ORAL | Status: DC | PRN
  Administered 2017-11-15 – 2017-11-16 (×7): 1 via ORAL
  Administered 2017-11-16 – 2017-11-17 (×3): 2 via ORAL
  Filled 2017-11-15 (×2): qty 1
  Filled 2017-11-15: qty 2
  Filled 2017-11-15 (×2): qty 1
  Filled 2017-11-15 (×3): qty 2
  Filled 2017-11-15 (×2): qty 1

## 2017-11-15 MED ORDER — ONDANSETRON HCL 4 MG/2ML IJ SOLN
INTRAMUSCULAR | Status: DC | PRN
  Administered 2017-11-15: 4 mg via INTRAVENOUS

## 2017-11-15 MED ORDER — MENTHOL 3 MG MT LOZG: 1.0000 | LOZENGE | OROMUCOSAL | Status: DC | PRN

## 2017-11-15 MED ORDER — PHENYLEPHRINE HCL 10 MG/ML IJ SOLN
INTRAVENOUS | Status: DC | PRN
  Administered 2017-11-15: 25 ug/min via INTRAVENOUS

## 2017-11-15 MED ORDER — DEXAMETHASONE SODIUM PHOSPHATE 10 MG/ML IJ SOLN
10.0000 mg | Freq: Once | INTRAMUSCULAR | Status: AC
  Administered 2017-11-16: 10 mg via INTRAVENOUS
  Filled 2017-11-15: qty 1

## 2017-11-15 MED ORDER — STERILE WATER FOR IRRIGATION IR SOLN
Status: DC | PRN
  Administered 2017-11-15: 2000 mL

## 2017-11-15 MED ORDER — PHENYLEPHRINE HCL 10 MG/ML IJ SOLN
INTRAMUSCULAR | Status: AC
  Filled 2017-11-15: qty 1

## 2017-11-15 MED ORDER — BUPIVACAINE-EPINEPHRINE 0.25% -1:200000 IJ SOLN
INTRAMUSCULAR | Status: DC | PRN
  Administered 2017-11-15: 30 mL

## 2017-11-15 MED ORDER — ROPIVACAINE HCL 7.5 MG/ML IJ SOLN
INTRAMUSCULAR | Status: DC | PRN
  Administered 2017-11-15: 20 mL via PERINEURAL

## 2017-11-15 MED ORDER — KETOROLAC TROMETHAMINE 30 MG/ML IJ SOLN
INTRAMUSCULAR | Status: DC | PRN
  Administered 2017-11-15: 30 mg

## 2017-11-15 MED ORDER — HYDROCODONE-ACETAMINOPHEN 5-325 MG PO TABS
1.0000 | ORAL_TABLET | ORAL | Status: DC | PRN
  Administered 2017-11-16: 2 via ORAL
  Filled 2017-11-15: qty 2

## 2017-11-15 MED ORDER — PHENOL 1.4 % MT LIQD: 1.0000 | OROMUCOSAL | Status: DC | PRN

## 2017-11-15 MED ORDER — ASPIRIN 81 MG PO CHEW
81.0000 mg | CHEWABLE_TABLET | Freq: Two times a day (BID) | ORAL | Status: DC
  Administered 2017-11-15 – 2017-11-17 (×4): 81 mg via ORAL
  Filled 2017-11-15 (×4): qty 1

## 2017-11-15 MED ORDER — POVIDONE-IODINE 10 % EX SWAB
2.0000 "application " | Freq: Once | CUTANEOUS | Status: AC
  Administered 2017-11-15: 2 via TOPICAL

## 2017-11-15 MED ORDER — ACETAMINOPHEN 325 MG PO TABS: 325.0000 mg | ORAL_TABLET | Freq: Four times a day (QID) | ORAL | Status: DC | PRN

## 2017-11-15 MED ORDER — MEPERIDINE HCL 50 MG/ML IJ SOLN: 6.2500 mg | INTRAMUSCULAR | Status: DC | PRN

## 2017-11-15 MED ORDER — METHOCARBAMOL 500 MG PO TABS
500.0000 mg | ORAL_TABLET | Freq: Four times a day (QID) | ORAL | Status: DC | PRN
  Administered 2017-11-15 – 2017-11-17 (×2): 500 mg via ORAL
  Filled 2017-11-15 (×2): qty 1

## 2017-11-15 MED ORDER — EPHEDRINE 5 MG/ML INJ
INTRAVENOUS | Status: AC
  Filled 2017-11-15: qty 10

## 2017-11-15 MED ORDER — PRAVASTATIN SODIUM 20 MG PO TABS
20.0000 mg | ORAL_TABLET | ORAL | Status: DC
  Administered 2017-11-15: 20 mg via ORAL
  Filled 2017-11-15: qty 1

## 2017-11-15 MED ORDER — DIPHENHYDRAMINE HCL 12.5 MG/5ML PO ELIX: 12.5000 mg | ORAL_SOLUTION | ORAL | Status: DC | PRN

## 2017-11-15 MED ORDER — PROPOFOL 500 MG/50ML IV EMUL
INTRAVENOUS | Status: DC | PRN
  Administered 2017-11-15: 75 ug/kg/min via INTRAVENOUS

## 2017-11-15 MED ORDER — SODIUM CHLORIDE 0.9 % IJ SOLN
INTRAMUSCULAR | Status: DC | PRN
  Administered 2017-11-15: 30 mL

## 2017-11-15 MED ORDER — MIDAZOLAM HCL 5 MG/5ML IJ SOLN
INTRAMUSCULAR | Status: DC | PRN
  Administered 2017-11-15: 1 mg via INTRAVENOUS

## 2017-11-15 MED ORDER — MORPHINE SULFATE (PF) 2 MG/ML IV SOLN: 0.5000 mg | INTRAVENOUS | Status: DC | PRN

## 2017-11-15 MED ORDER — FENTANYL CITRATE (PF) 100 MCG/2ML IJ SOLN
INTRAMUSCULAR | Status: DC | PRN
  Administered 2017-11-15: 50 ug via INTRAVENOUS

## 2017-11-15 MED ORDER — PROPOFOL 10 MG/ML IV BOLUS
INTRAVENOUS | Status: AC
  Filled 2017-11-15: qty 40

## 2017-11-15 MED ORDER — KETOROLAC TROMETHAMINE 30 MG/ML IJ SOLN
INTRAMUSCULAR | Status: AC
  Filled 2017-11-15: qty 1

## 2017-11-15 MED ORDER — TRANEXAMIC ACID 1000 MG/10ML IV SOLN
1000.0000 mg | INTRAVENOUS | Status: AC
  Administered 2017-11-15: 1000 mg via INTRAVENOUS
  Filled 2017-11-15: qty 10

## 2017-11-15 MED ORDER — METOCLOPRAMIDE HCL 5 MG PO TABS: 5.0000 mg | ORAL_TABLET | Freq: Three times a day (TID) | ORAL | Status: DC | PRN

## 2017-11-15 MED ORDER — BUPIVACAINE-EPINEPHRINE (PF) 0.25% -1:200000 IJ SOLN
INTRAMUSCULAR | Status: AC
  Filled 2017-11-15: qty 30

## 2017-11-15 MED ORDER — ONDANSETRON HCL 4 MG PO TABS: 4.0000 mg | ORAL_TABLET | Freq: Four times a day (QID) | ORAL | Status: DC | PRN

## 2017-11-15 SURGICAL SUPPLY — 67 items
BAG ZIPLOCK 12X15 (MISCELLANEOUS) IMPLANT
BANDAGE ACE 4X5 VEL STRL LF (GAUZE/BANDAGES/DRESSINGS) ×3 IMPLANT
BANDAGE ACE 6X5 VEL STRL LF (GAUZE/BANDAGES/DRESSINGS) ×3 IMPLANT
BATTERY INSTRU NAVIGATION (MISCELLANEOUS) ×9 IMPLANT
BEARING TIBIA INSRT KNEE SZ4 9 (Insert) ×1 IMPLANT
BLADE SAW RECIPROCATING 77.5 (BLADE) ×3 IMPLANT
CHLORAPREP W/TINT 26ML (MISCELLANEOUS) ×6 IMPLANT
COVER SURGICAL LIGHT HANDLE (MISCELLANEOUS) ×3 IMPLANT
CUFF TOURN SGL QUICK 34 (TOURNIQUET CUFF) ×2
CUFF TRNQT CYL 34X4X40X1 (TOURNIQUET CUFF) ×1 IMPLANT
DECANTER SPIKE VIAL GLASS SM (MISCELLANEOUS) ×6 IMPLANT
DERMABOND ADVANCED (GAUZE/BANDAGES/DRESSINGS) ×2
DERMABOND ADVANCED .7 DNX12 (GAUZE/BANDAGES/DRESSINGS) ×1 IMPLANT
DRAPE SHEET LG 3/4 BI-LAMINATE (DRAPES) ×9 IMPLANT
DRAPE U-SHAPE 47X51 STRL (DRAPES) ×3 IMPLANT
DRSG AQUACEL AG ADV 3.5X10 (GAUZE/BANDAGES/DRESSINGS) ×3 IMPLANT
DRSG TEGADERM 4X4.75 (GAUZE/BANDAGES/DRESSINGS) IMPLANT
ELECT BLADE TIP CTD 4 INCH (ELECTRODE) ×3 IMPLANT
ELECT REM PT RETURN 15FT ADLT (MISCELLANEOUS) ×3 IMPLANT
EVACUATOR 1/8 PVC DRAIN (DRAIN) IMPLANT
GAUZE SPONGE 4X4 12PLY STRL (GAUZE/BANDAGES/DRESSINGS) ×3 IMPLANT
GLOVE BIO SURGEON STRL SZ8.5 (GLOVE) ×6 IMPLANT
GLOVE BIOGEL M STRL SZ7.5 (GLOVE) ×3 IMPLANT
GLOVE BIOGEL PI IND STRL 7.0 (GLOVE) ×3 IMPLANT
GLOVE BIOGEL PI IND STRL 7.5 (GLOVE) ×2 IMPLANT
GLOVE BIOGEL PI IND STRL 8.5 (GLOVE) ×1 IMPLANT
GLOVE BIOGEL PI INDICATOR 7.0 (GLOVE) ×6
GLOVE BIOGEL PI INDICATOR 7.5 (GLOVE) ×4
GLOVE BIOGEL PI INDICATOR 8.5 (GLOVE) ×2
GLOVE SURG SS PI 7.5 STRL IVOR (GLOVE) ×3 IMPLANT
GOWN SPEC L3 XXLG W/TWL (GOWN DISPOSABLE) ×6 IMPLANT
GOWN SPEC L4 XLG W/TWL (GOWN DISPOSABLE) ×3 IMPLANT
GOWN STRL REUS W/TWL XL LVL4 (GOWN DISPOSABLE) ×3 IMPLANT
HANDPIECE INTERPULSE COAX TIP (DISPOSABLE) ×2
HOOD PEEL AWAY FLYTE STAYCOOL (MISCELLANEOUS) ×9 IMPLANT
KNEE FEMORAL COMP RT RETAIN (Knees) ×3 IMPLANT
KNEE PATELLA ASYMMETRIC 10X32 (Knees) ×3 IMPLANT
KNEE TIBIAL COMP TRI SZ4 (Knees) ×3 IMPLANT
MARKER SKIN DUAL TIP RULER LAB (MISCELLANEOUS) ×3 IMPLANT
NEEDLE SPNL 18GX3.5 QUINCKE PK (NEEDLE) ×3 IMPLANT
NS IRRIG 1000ML POUR BTL (IV SOLUTION) ×3 IMPLANT
PACK TOTAL KNEE CUSTOM (KITS) ×3 IMPLANT
PADDING CAST COTTON 6X4 STRL (CAST SUPPLIES) ×3 IMPLANT
POSITIONER SURGICAL ARM (MISCELLANEOUS) ×3 IMPLANT
SAW OSC TIP CART 19.5X105X1.3 (SAW) ×3 IMPLANT
SEALER BIPOLAR AQUA 6.0 (INSTRUMENTS) ×3 IMPLANT
SET HNDPC FAN SPRY TIP SCT (DISPOSABLE) ×1 IMPLANT
SET PAD KNEE POSITIONER (MISCELLANEOUS) ×3 IMPLANT
SPONGE DRAIN TRACH 4X4 STRL 2S (GAUZE/BANDAGES/DRESSINGS) IMPLANT
SPONGE LAP 18X18 RF (DISPOSABLE) IMPLANT
SUT MNCRL AB 3-0 PS2 18 (SUTURE) ×3 IMPLANT
SUT MON AB 2-0 CT1 36 (SUTURE) ×9 IMPLANT
SUT STRATAFIX PDO 1 14 VIOLET (SUTURE) ×2
SUT STRATFX PDO 1 14 VIOLET (SUTURE) ×1
SUT VIC AB 1 CT1 36 (SUTURE) ×6 IMPLANT
SUT VIC AB 2-0 CT1 27 (SUTURE) ×2
SUT VIC AB 2-0 CT1 TAPERPNT 27 (SUTURE) ×1 IMPLANT
SUTURE STRATFX PDO 1 14 VIOLET (SUTURE) ×1 IMPLANT
SYR 50ML LL SCALE MARK (SYRINGE) ×3 IMPLANT
TIBIA BEAR INSERT KNEE SZ4 9 (Insert) ×3 IMPLANT
TOWER CARTRIDGE SMART MIX (DISPOSABLE) IMPLANT
TRAY FOLEY CATH 14FRSI W/METER (CATHETERS) ×3 IMPLANT
TRAY FOLEY MTR SLVR 16FR STAT (SET/KITS/TRAYS/PACK) IMPLANT
TRIATHLON TIBIAL BEARING INSERT SIZE 4 9MM (Knees) ×2 IMPLANT
WATER STERILE IRR 1000ML POUR (IV SOLUTION) ×6 IMPLANT
WRAP KNEE MAXI GEL POST OP (GAUZE/BANDAGES/DRESSINGS) ×3 IMPLANT
YANKAUER SUCT BULB TIP 10FT TU (MISCELLANEOUS) ×3 IMPLANT

## 2017-11-15 NOTE — Op Note (Signed)
OPERATIVE REPORT  SURGEON: Rod Can, MD   ASSISTANT: Nehemiah Massed, PA-C.  PREOPERATIVE DIAGNOSIS: Right knee arthritis.   POSTOPERATIVE DIAGNOSIS: Right knee arthritis.   PROCEDURE: Right total knee arthroplasty.   IMPLANTS: Stryker Triathlon CR femur, size 4. Stryker Tritanium tibia, size 4. X3 polyethelyene insert, size 9 mm, CR. 3 button asymmetric patella, size 32 mm.  ANESTHESIA:  Regional and Spinal  TOURNIQUET TIME: Not utilized.   ESTIMATED BLOOD LOSS:-250 mL    ANTIBIOTICS: 2 g Ancef.  DRAINS: None.  COMPLICATIONS: None   CONDITION: PACU - hemodynamically stable.   BRIEF CLINICAL NOTE: Cheryl Lane is a 67 y.o. female with a long-standing history of Right knee arthritis. After failing conservative management, the patient was indicated for total knee arthroplasty. The risks, benefits, and alternatives to the procedure were explained, and the patient elected to proceed.  PROCEDURE IN DETAIL: Adductor canal block was obtained in the pre-op holding area. Once inside the operative room, spinal anesthesia was obtained, and a foley catheter was inserted. The patient was then positioned, a nonsterile tourniquet was placed, and the lower extremity was prepped and draped in the normal sterile surgical fashion. A time-out was called verifying side and site of surgery. The patient received IV antibiotics within 60 minutes of beginning the procedure. The tourniquet was not utilized.  An anterior approach to the knee was performed utilizing a midvastus arthrotomy. A medial release was performed and the patellar fat pad was excised. Stryker navigation was used to cut the distal femur perpendicular to the mechanical axis. A freehand patellar resection was performed, and the patella was sized an prepared with 3 lug holes.  Nagivation was used to make a neutral proximal tibia  resection, taking 5 mm of bone from the less affected medial side with 3 degrees of slope. The menisci were excised. A spacer block was placed, and the alignment and balance in extension were confirmed.   The distal femur was sized using the 3-degree external rotation guide referencing the posterior femoral cortex. The appropriate 4-in-1 cutting block was pinned into place. Rotation was checked using Whiteside's line, the epicondylar axis, and then confirmed with a spacer block in flexion. The remaining femoral cuts were performed, taking care to protect the MCL.  The tibia was sized and the trial tray was pinned into place. The remaining trail components were inserted. The knee was stable to varus and valgus stress through a full range of motion. The patella tracked centrally, and the PCL was well balanced. The trial components were removed, and the proximal tibial surface was prepared. Final components were impacted into place. The knee was tested for a final time and found to be well balanced.  The wound was copiously irrigated with normal saline with pulse lavage. Marcaine solution was injected into the periarticular soft tissue. The wound was closed in layers using #1 Vicryl and Stratafix for the fascia, 2-0 Vicryl for the subcutaneous fat, 2-0 Monocryl for the deep dermal layer, 3-0 running Monocryl subcuticular Stitch, and Dermabond for the skin. Once the glue was fully dried, an Aquacell Ag and compressive dressing were applied. Tthe patient was transported to the recovery room in stable condition. Sponge, needle, and instrument counts were correct at the end of the case x2. The patient tolerated the procedure well and there were no known complications.  Please note that a surgical assistant was a medical necessity for this procedure in order to perform it in a safe and expeditious manner. Surgical assistant was necessary  to retract the ligaments and vital neurovascular structures to prevent  injury to them and also necessary for proper positioning of the limb to allow for anatomic placement of the prosthesis.

## 2017-11-15 NOTE — Interval H&P Note (Signed)
History and Physical Interval Note:  11/15/2017 7:38 AM  Cheryl Lane  has presented today for surgery, with the diagnosis of Degenerative joint disease right knee  The various methods of treatment have been discussed with the patient and family. After consideration of risks, benefits and other options for treatment, the patient has consented to  Procedure(s) with comments: RIGHT TOTAL KNEE ARTHROPLASTY WITH COMPUTER NAVIGATION (Right) - Needs RNFA as a surgical intervention .  The patient's history has been reviewed, patient examined, no change in status, stable for surgery.  I have reviewed the patient's chart and labs.  Questions were answered to the patient's satisfaction.     Cheryl Lane

## 2017-11-15 NOTE — Evaluation (Signed)
Physical Therapy Evaluation Patient Details Name: Cheryl Lane MRN: [id number] DOB: January 14, 1951 Today's Date: 11/15/2017   History of Present Illness  Pt s/p R TKR and with hx of CKD, Reverse L TSR and cervical radiculopathy  Clinical Impression  Pt s/p R TKR and presents with decreased L LE strength/ROM, arthritic R knee, obesity and post op pain limiting functional mobility.  Pt should progress to dc home with family assist and has OP PT scheduled to begin 11/19/17.     Follow Up Recommendations Follow surgeon's recommendation for DC plan and follow-up therapies    Equipment Recommendations  None recommended by PT    Recommendations for Other Services       Precautions / Restrictions Precautions Precautions: Knee;Fall Restrictions Weight Bearing Restrictions: No Other Position/Activity Restrictions: WBAT      Mobility  Bed Mobility Overal bed mobility: Needs Assistance Bed Mobility: Supine to Sit     Supine to sit: HOB elevated;Min assist     General bed mobility comments: cues for sequence and use of L LE to self assist  Transfers Overall transfer level: Needs assistance Equipment used: Rolling walker (2 wheeled) Transfers: Sit to/from Stand Sit to Stand: Mod assist;+2 physical assistance;+2 safety/equipment;From elevated surface         General transfer comment: cues for LE management and use of UEs to self assist.  Physical assist to bring wt up and fwd  Ambulation/Gait Ambulation/Gait assistance: Min assist Gait Distance (Feet): 39 Feet Assistive device: Rolling walker (2 wheeled) Gait Pattern/deviations: Step-to pattern;Decreased step length - right;Decreased step length - left;Shuffle;Trunk flexed Gait velocity: decr   General Gait Details: cues for sequence, posture and position from [employer]            Wheelchair Mobility    Modified Rankin (Stroke Patients Only)       Balance Overall balance assessment: Needs  assistance Sitting-balance support: No upper extremity supported Sitting balance-Leahy Scale: Good     Standing balance support: Bilateral upper extremity supported Standing balance-Leahy Scale: Poor                               Pertinent Vitals/Pain Pain Assessment: 0-10 Pain Score: 4  Pain Location: R knee Pain Descriptors / Indicators: Aching;Sore Pain Intervention(s): Limited activity within patient's tolerance;Monitored during session;Premedicated before session;Ice applied    Home Living Family/patient expects to be discharged to:: Private residence Living Arrangements: Children Available Help at Discharge: Family;Home health;Personal care attendant;Available 24 hours/day Type of Home: House Home Access: Stairs to enter Entrance Stairs-Rails: Right Entrance Stairs-Number of Steps: 3 Home Layout: Able to live on main level with bedroom/bathroom Home Equipment: Walker - 4 wheels;Cane - single point;Walker - 2 wheels;Hospital bed      Prior Function Level of Independence: Needs assistance   Gait / Transfers Assistance Needed: used cane indoors most of time and rollator on bad days.  Used rollator outdoors and community.  ADL's / Homemaking Assistance Needed: assist of family as needed  Comments: Pt lives with dtr     Hand Dominance   Dominant Hand: Right    Extremity/Trunk Assessment   Upper Extremity Assessment Upper Extremity Assessment: Overall WFL for tasks assessed    Lower Extremity Assessment Lower Extremity Assessment: RLE deficits/detail    Cervical / Trunk Assessment Cervical / Trunk Assessment: Kyphotic  Communication   Communication: No difficulties  Cognition Arousal/Alertness: Awake/alert Behavior During Therapy: WFL for tasks assessed/performed Overall Cognitive  Status: Within Functional Limits for tasks assessed                                        General Comments      Exercises Total Joint  Exercises Ankle Circles/Pumps: AROM;Both;15 reps;Supine   Assessment/Plan    PT Assessment Patient needs continued PT services  PT Problem List Decreased strength;Decreased range of motion;Decreased activity tolerance;Decreased mobility;Decreased balance;Decreased knowledge of use of DME;Pain       PT Treatment Interventions DME instruction;Gait training;Stair training;Functional mobility training;Therapeutic activities;Therapeutic exercise;Patient/family education    PT Goals (Current goals can be found in the Care Plan section)  Acute Rehab PT Goals Patient Stated Goal: Regain IND PT Goal Formulation: With patient Time For Goal Achievement: 11/22/17 Potential to Achieve Goals: Good    Frequency 7X/week   Barriers to discharge        Co-evaluation               AM-PAC PT "6 Clicks" Daily Activity  Outcome Measure Difficulty turning over in bed (including adjusting bedclothes, sheets and blankets)?: Unable Difficulty moving from lying on back to sitting on the side of the bed? : Unable Difficulty sitting down on and standing up from a chair with arms (e.g., wheelchair, bedside commode, etc,.)?: Unable Help needed moving to and from a bed to chair (including a wheelchair)?: A Lot Help needed walking in hospital room?: A Little Help needed climbing 3-5 steps with a railing? : A Lot 6 Click Score: 10    End of Session Equipment Utilized During Treatment: Gait belt Activity Tolerance: Patient tolerated treatment well Patient left: in chair;with call bell/phone within reach;with chair alarm set Nurse Communication: Mobility status PT Visit Diagnosis: Difficulty in walking, not elsewhere classified (R26.2)    Time: 8099-8338 PT Time Calculation (min) (ACUTE ONLY): 33 min   Charges:   PT Evaluation $PT Eval Low Complexity: 1 Low PT Treatments $Gait Training: 8-22 mins        Pg [phone]   Brecken Walth 11/15/2017, 5:05 PM

## 2017-11-15 NOTE — Anesthesia Postprocedure Evaluation (Signed)
Anesthesia Post Note  Patient: Cheryl Lane  Procedure(s) Performed: RIGHT TOTAL KNEE ARTHROPLASTY WITH COMPUTER NAVIGATION (Right Knee)     Patient location during evaluation: PACU Anesthesia Type: Spinal Level of consciousness: oriented and awake and alert Pain management: pain level controlled Vital Signs Assessment: post-procedure vital signs reviewed and stable Respiratory status: spontaneous breathing, respiratory function stable and nonlabored ventilation Cardiovascular status: blood pressure returned to baseline and stable Postop Assessment: no headache, no backache and no apparent nausea or vomiting Anesthetic complications: no    Last Vitals:  Vitals:   11/15/17 1024 11/15/17 1030  BP:  (!) 99/59  Pulse:  85  Resp:  20  Temp:    SpO2: 100% 100%    Last Pain:  Vitals:   11/15/17 1045  TempSrc:   PainSc: 0-No pain                 Keriann Rankin A.

## 2017-11-15 NOTE — Discharge Instructions (Signed)
° °Dr. Carmelite Violet °Total Joint Specialist °[facility] °[address] °[place], [place] [address] °[phone] ° °TOTAL KNEE REPLACEMENT POSTOPERATIVE DIRECTIONS ° ° ° °Knee Rehabilitation, Guidelines Following Surgery  °Results after knee surgery are often greatly improved when you follow the exercise, range of motion and muscle strengthening exercises prescribed by your doctor. Safety measures are also important to protect the knee from further injury. Any time any of these exercises cause you to have increased pain or swelling in your knee joint, decrease the amount until you are comfortable again and slowly increase them. If you have problems or questions, call your caregiver or physical therapist for advice.  ° °WEIGHT BEARING °Weight bearing as tolerated with assist device (walker, cane, etc) as directed, use it as long as suggested by your surgeon or therapist, typically at least 4-6 weeks. ° °HOME CARE INSTRUCTIONS  °Remove items at home which could result in a fall. This includes throw rugs or furniture in walking pathways.  °Continue medications as instructed at time of discharge. °You may have some home medications which will be placed on hold until you complete the course of blood thinner medication.  °You may start showering once you are discharged home but do not submerge the incision under water. Just pat the incision dry and apply a dry gauze dressing on daily. °Walk with walker as instructed.  °You may resume a sexual relationship in one month or when given the OK by your doctor.  °· Use walker as long as suggested by your caregivers. °· Avoid periods of inactivity such as sitting longer than an hour when not asleep. This helps prevent blood clots.  °You may put full weight on your legs and walk as much as is comfortable.  °You may return to work once you are cleared by your doctor.  °Do not drive a car for 6 weeks or until released by you surgeon.  °· Do not drive  while taking narcotics.  °Wear the elastic stockings for three weeks following surgery during the day but you may remove then at night. °Make sure you keep all of your appointments after your operation with all of your doctors and caregivers. You should call the office at the above phone number and make an appointment for approximately two weeks after the date of your surgery. °Do not remove your surgical dressing. The dressing is waterproof; you may take showers in 3 days, but do not take tub baths or submerge the dressing. °Please pick up a stool softener and laxative for home use as long as you are requiring pain medications. °· ICE to the affected knee every three hours for 30 minutes at a time and then as needed for pain and swelling.  Continue to use ice on the knee for pain and swelling from surgery. You may notice swelling that will progress down to the foot and ankle.  This is normal after surgery.  Elevate the leg when you are not up walking on it.   °It is important for you to complete the blood thinner medication as prescribed by your doctor. °· Continue to use the breathing machine which will help keep your temperature down.  It is common for your temperature to cycle up and down following surgery, especially at night when you are not up moving around and exerting yourself.  The breathing machine keeps your lungs expanded and your temperature down. ° °RANGE OF MOTION AND STRENGTHENING EXERCISES  °Rehabilitation of the knee is important following   a knee injury or an operation. After just a few days of immobilization, the muscles of the thigh which control the knee become weakened and shrink (atrophy). Knee exercises are designed to build up the tone and strength of the thigh muscles and to improve knee motion. Often times heat used for twenty to thirty minutes before working out will loosen up your tissues and help with improving the range of motion but do not use heat for the first two weeks following  surgery. These exercises can be done on a training (exercise) mat, on the floor, on a table or on a bed. Use what ever works the best and is most comfortable for you Knee exercises include:  °Leg Lifts - While your knee is still immobilized in a splint or cast, you can do straight leg raises. Lift the leg to 60 degrees, hold for 3 sec, and slowly lower the leg. Repeat 10-20 times 2-3 times daily. Perform this exercise against resistance later as your knee gets better.  °Quad and Hamstring Sets - Tighten up the muscle on the front of the thigh (Quad) and hold for 5-10 sec. Repeat this 10-20 times hourly. Hamstring sets are done by pushing the foot backward against an object and holding for 5-10 sec. Repeat as with quad sets.  °A rehabilitation program following serious knee injuries can speed recovery and prevent re-injury in the future due to weakened muscles. Contact your doctor or a physical therapist for more information on knee rehabilitation.  ° °SKILLED REHAB INSTRUCTIONS: °If the patient is transferred to a skilled rehab facility following release from the hospital, a list of the current medications will be sent to the facility for the patient to continue.  When discharged from the skilled rehab facility, please have the facility set up the patient's Home Health Physical Therapy prior to being released. Also, the skilled facility will be responsible for providing the patient with their medications at time of release from the facility to include their pain medication, the muscle relaxants, and their blood thinner medication. If the patient is still at the rehab facility at time of the two week follow up appointment, the skilled rehab facility will also need to assist the patient in arranging follow up appointment in our office and any transportation needs. ° °MAKE SURE YOU:  °Understand these instructions.  °Will watch your condition.  °Will get help right away if you are not doing well or get worse.   ° ° °Pick up stool softner and laxative for home use following surgery while on pain medications. °Do NOT remove your dressing. You may shower.  °Do not take tub baths or submerge incision under water. °May shower starting three days after surgery. °Please use a clean towel to pat the incision dry following showers. °Continue to use ice for pain and swelling after surgery. °Do not use any lotions or creams on the incision until instructed by your surgeon. ° °

## 2017-11-15 NOTE — Progress Notes (Signed)
Anne on 3 E notified pt will be in room in 20 minutes

## 2017-11-15 NOTE — Anesthesia Procedure Notes (Signed)
Spinal  Patient location during procedure: OR Start time: 11/15/2017 7:40 AM Staffing Anesthesiologist: Josephine Igo, MD Performed: anesthesiologist  Preanesthetic Checklist Completed: patient identified, site marked, surgical consent, pre-op evaluation, timeout performed, IV checked, risks and benefits discussed and monitors and equipment checked Spinal Block Patient position: sitting Prep: site prepped and draped and DuraPrep Patient monitoring: heart rate, cardiac monitor, continuous pulse ox and blood pressure Approach: midline Location: L3-4 Injection technique: single-shot Needle Needle type: Pencan  Needle gauge: 24 G Needle length: 9 cm Needle insertion depth: 7 cm Assessment Sensory level: T4 Additional Notes Difficult due to scoliosis and MO. Patient tolerated procedure well. Adequate sensory level.

## 2017-11-15 NOTE — Progress Notes (Signed)
Dr Royce Macadamia aware pts urine output 20MLs, 2000 mls input and 100 mls urine iin OR, pt may be transferred to room per Dr Royce Macadamia

## 2017-11-15 NOTE — Transfer of Care (Signed)
Immediate Anesthesia Transfer of Care Note  Patient: Cheryl Lane  Procedure(s) Performed: RIGHT TOTAL KNEE ARTHROPLASTY WITH COMPUTER NAVIGATION (Right Knee)  Patient Location: PACU  Anesthesia Type:Regional and Spinal  Level of Consciousness: awake, alert  and oriented  Airway & Oxygen Therapy: Patient Spontanous Breathing and Patient connected to face mask oxygen  Post-op Assessment: Report given to RN and Post -op Vital signs reviewed and stable  Post vital signs: Reviewed and stable  Last Vitals:  Vitals Value Taken Time  BP 103/64 11/15/2017 10:22 AM  Temp    Pulse 82 11/15/2017 10:25 AM  Resp 18 11/15/2017 10:25 AM  SpO2 100 % 11/15/2017 10:25 AM  Vitals shown include unvalidated device data.  Last Pain:  Vitals:   11/15/17 0617  TempSrc:   PainSc: 3       Patients Stated Pain Goal: 4 (28/41/32 4401)  Complications: No apparent anesthesia complications

## 2017-11-15 NOTE — Anesthesia Procedure Notes (Signed)
Anesthesia Regional Block: Adductor canal block   Pre-Anesthetic Checklist: ,, timeout performed, Correct Patient, Correct Site, Correct Laterality, Correct Procedure, Correct Position, site marked, Risks and benefits discussed,  Surgical consent,  Pre-op evaluation,  At surgeon's request and post-op pain management  Laterality: Right  Prep: chloraprep       Needles:  Injection technique: Single-shot  Needle Type: Echogenic Stimulator Needle     Needle Length: 9cm  Needle Gauge: 21   Needle insertion depth: 8 cm   Additional Needles:   Procedures:,,,, ultrasound used (permanent image in chart),,,,  Narrative:  Start time: 11/15/2017 7:03 AM End time: 11/15/2017 7:08 AM Injection made incrementally with aspirations every 5 mL.  Performed by: Personally  Anesthesiologist: Josephine Igo, MD  Additional Notes: Timeout performed. Patient sedated. Relevant anatomy ID'd using [place]. Incremental 2-39ml injection of LA with frequent aspiration. Patient tolerated procedure well.        Right Adductor Canal Block

## 2017-11-16 ENCOUNTER — Encounter (HOSPITAL_COMMUNITY): Payer: Self-pay | Admitting: Orthopedic Surgery

## 2017-11-16 LAB — BASIC METABOLIC PANEL
Anion gap: 5 (ref 5–15)
BUN: 27 mg/dL — ABNORMAL HIGH (ref 8–23)
CHLORIDE: 106 mmol/L (ref 98–111)
CO2: 29 mmol/L (ref 22–32)
CREATININE: 1.18 mg/dL — AB (ref 0.44–1.00)
Calcium: 8.7 mg/dL — ABNORMAL LOW (ref 8.9–10.3)
GFR calc non Af Amer: 46 mL/min — ABNORMAL LOW (ref >60)
GFR, EST AFRICAN AMERICAN: 53 mL/min — AB (ref >60)
GLUCOSE: 126 mg/dL — AB (ref 70–99)
Potassium: 4 mmol/L (ref 3.5–5.1)
Sodium: 140 mmol/L (ref 135–145)

## 2017-11-16 LAB — CBC
HEMATOCRIT: 32.7 % — AB (ref 36.0–46.0)
Hemoglobin: 10.3 g/dL — ABNORMAL LOW (ref 12.0–15.0)
MCH: 29.3 pg (ref 26.0–34.0)
MCHC: 31.5 g/dL (ref 30.0–36.0)
MCV: 92.9 fL (ref 78.0–100.0)
Platelets: 205 10*3/uL (ref 150–400)
RBC: 3.52 MIL/uL — ABNORMAL LOW (ref 3.87–5.11)
RDW: 14.2 % (ref 11.5–15.5)
WBC: 5 10*3/uL (ref 4.0–10.5)

## 2017-11-16 MED ORDER — DOCUSATE SODIUM 100 MG PO CAPS: 100.0000 mg | ORAL_CAPSULE | Freq: Two times a day (BID) | ORAL | 1 refills | Status: AC

## 2017-11-16 MED ORDER — SENNA 8.6 MG PO TABS: 1.0000 | ORAL_TABLET | Freq: Two times a day (BID) | ORAL | 0 refills | Status: AC

## 2017-11-16 MED ORDER — ONDANSETRON HCL 4 MG PO TABS: 4.0000 mg | ORAL_TABLET | Freq: Four times a day (QID) | ORAL | 0 refills | Status: DC | PRN

## 2017-11-16 MED ORDER — HYDROCODONE-ACETAMINOPHEN 5-325 MG PO TABS: 1.0000 | ORAL_TABLET | Freq: Four times a day (QID) | ORAL | 0 refills | Status: DC | PRN

## 2017-11-16 MED ORDER — ASPIRIN 81 MG PO CHEW: 81.0000 mg | CHEWABLE_TABLET | Freq: Two times a day (BID) | ORAL | 1 refills | Status: AC

## 2017-11-16 MED FILL — HYDROCODON-APAP 5-325: 5-325 | 4 days supply | Qty: 30 | Fill #0

## 2017-11-16 MED FILL — ONDANSETRON HCL 4 MG TABLET: 4 | 10 days supply | Qty: 20 | Fill #0

## 2017-11-16 NOTE — Progress Notes (Signed)
RN gave family prescriptions today because patient uses [facility] and they are closed on Saturday.   RN requested that daughter communicate this information to RN who will be discharging her mom tomorrow,and stated that a note will be included in chart.

## 2017-11-16 NOTE — Progress Notes (Signed)
Physical Therapy Treatment Patient Details Name: Cheryl Lane MRN: [id number] DOB: 1950/05/05 Today's Date: 11/16/2017    History of Present Illness Pt s/p R TKR and with hx of CKD, Reverse L TSR and cervical radiculopathy    PT Comments    Pt with increased R knee pain today, which pt states has been throughout the night and this morning. Initiated exercises with pt today, but requires mod verbal and tactile cuing to perform exercises correctly especially quad sets. Pt continues to progress mobility. Will continue to follow to address acute PT needs.    Follow Up Recommendations  Follow surgeon's recommendation for DC plan and follow-up therapies     Equipment Recommendations  None recommended by PT    Recommendations for Other Services       Precautions / Restrictions Precautions Precautions: Knee;Fall Restrictions Weight Bearing Restrictions: No Other Position/Activity Restrictions: WBAT    Mobility  Bed Mobility Overal bed mobility: Needs Assistance Bed Mobility: Supine to Sit     Supine to sit: HOB elevated;Min assist;Mod assist     General bed mobility comments: cues for sequence and use of LLE to self assist, trunk and pelvic assist for scooting to EOB   Transfers Overall transfer level: Needs assistance Equipment used: Rolling walker (2 wheeled) Transfers: Sit to/from Stand Sit to Stand: +2 physical assistance;From elevated surface;Min assist         General transfer comment: cues for LE management,sequencing, and use of UEs to self assist.  Min assist for power up and hip extension   Ambulation/Gait Ambulation/Gait assistance: Min assist Gait Distance (Feet): 40 Feet Assistive device: Rolling walker (2 wheeled) Gait Pattern/deviations: Step-to pattern;Decreased step length - right;Decreased step length - left;Trunk flexed Gait velocity: decr   General Gait Details: Verbal and tactile cues for hip extension, shoulder retraction, and looking  forward. Assist for sequencing. Increased time for ambulation today due to pain    Stairs             Wheelchair Mobility    Modified Rankin (Stroke Patients Only)       Balance Overall balance assessment: Needs assistance Sitting-balance support: No upper extremity supported Sitting balance-Leahy Scale: Good     Standing balance support: Bilateral upper extremity supported Standing balance-Leahy Scale: Poor                              Cognition Arousal/Alertness: Awake/alert Behavior During Therapy: WFL for tasks assessed/performed Overall Cognitive Status: Within Functional Limits for tasks assessed                                        Exercises Total Joint Exercises Ankle Circles/Pumps: AROM;20 reps;Supine Quad Sets: AROM;15 reps;Supine(mod verbal and tactile cuing ) Heel Slides: AAROM;15 reps;Supine Straight Leg Raises: AAROM;10 reps;Supine    General Comments  Knee flexion AAROM 70 degrees, knee extension lacking 10 degrees      Pertinent Vitals/Pain Pain Assessment: 0-10 Pain Score: 5  Pain Descriptors / Indicators: Aching;Constant Pain Intervention(s): Limited activity within patient's tolerance;Monitored during session;Ice applied    Home Living                      Prior Function            PT Goals (current goals can now be found in the care plan section)  Acute Rehab PT Goals Patient Stated Goal: Regain IND PT Goal Formulation: With patient Time For Goal Achievement: 11/22/17 Potential to Achieve Goals: Good Progress towards PT goals: Progressing toward goals    Frequency    7X/week      PT Plan      Co-evaluation              AM-PAC PT "6 Clicks" Daily Activity  Outcome Measure  Difficulty turning over in bed (including adjusting bedclothes, sheets and blankets)?: Unable Difficulty moving from lying on back to sitting on the side of the bed? : Unable Difficulty sitting down on  and standing up from a chair with arms (e.g., wheelchair, bedside commode, etc,.)?: Unable Help needed moving to and from a bed to chair (including a wheelchair)?: A Lot Help needed walking in hospital room?: A Little Help needed climbing 3-5 steps with a railing? : A Lot 6 Click Score: 10    End of Session Equipment Utilized During Treatment: Gait belt Activity Tolerance: Patient tolerated treatment well Patient left: in chair;with call bell/phone within reach;with chair alarm set Nurse Communication: Mobility status PT Visit Diagnosis: Difficulty in walking, not elsewhere classified (R26.2)     Time: 6433-2951 PT Time Calculation (min) (ACUTE ONLY): 41 min  Charges:  $Gait Training: 8-22 mins $Therapeutic Exercise: 8-22 mins $Therapeutic Activity: 8-22 mins                     Sarajean Dessert D Bader Stubblefield PT DPT    Mehtaab Mayeda D Cimone Fahey 11/16/2017, 12:00 PM

## 2017-11-16 NOTE — Progress Notes (Signed)
Physical Therapy Treatment Patient Details Name: Cheryl Lane MRN: [id number] DOB: [birth year] Today's Date: 11/16/2017    History of Present Illness Pt s/p R TKR and with hx of CKD, Reverse L TSR and cervical radiculopathy    PT Comments    Pt with increased tolerance for ambulation this afternoon, demonstrated in further distance walked and pain level remaining steady. Pt progressing well with acute PT, but continues to have difficulty with sit to stand due to lack of power up and hip extension. Will follow up tomorrow.    Follow Up Recommendations  Follow surgeon's recommendation for DC plan and follow-up therapies     Equipment Recommendations  None recommended by PT    Recommendations for Other Services       Precautions / Restrictions Precautions Precautions: Knee;Fall Restrictions Weight Bearing Restrictions: No Other Position/Activity Restrictions: WBAT    Mobility  Bed Mobility                  Transfers Overall transfer level: Needs assistance Equipment used: Rolling walker (2 wheeled) Transfers: Sit to/from Stand Sit to Stand: +2 physical assistance;From elevated surface;Mod assist         General transfer comment: cues for LE management,sequencing, and use of UEs to self assist.  Mod assist for power up and hip extension, PT facilitation of glutes.   Ambulation/Gait Ambulation/Gait assistance: Min assist Gait Distance (Feet): 60 Feet Assistive device: Rolling walker (2 wheeled) Gait Pattern/deviations: Step-to pattern;Decreased step length - right;Decreased step length - left;Trunk flexed;Decreased stance time - right Gait velocity: decreased    General Gait Details: Verbal and tactile cues for hip extension, shoulder retraction, and looking forward. Some assist for sequencing. Increased time for ambulation   Stairs             Wheelchair Mobility    Modified Rankin (Stroke Patients Only)       Balance Overall balance  assessment: Needs assistance Sitting-balance support: No upper extremity supported Sitting balance-Leahy Scale: Good     Standing balance support: Bilateral upper extremity supported Standing balance-Leahy Scale: Poor                              Cognition Arousal/Alertness: Awake/alert Behavior During Therapy: WFL for tasks assessed/performed Overall Cognitive Status: Within Functional Limits for tasks assessed                                        Exercises      General Comments        Pertinent Vitals/Pain Pain Assessment: 0-10 Pain Score: 3  Pain Descriptors / Indicators: Aching;Sore Pain Intervention(s): Limited activity within patient's tolerance;Premedicated before session;Ice applied    Home Living                      Prior Function            PT Goals (current goals can now be found in the care plan section)      Frequency    7X/week      PT Plan Current plan remains appropriate    Co-evaluation              AM-PAC PT "6 Clicks" Daily Activity  Outcome Measure  Difficulty turning over in bed (including adjusting bedclothes, sheets and blankets)?: Unable Difficulty moving from lying  on back to sitting on the side of the bed? : Unable Difficulty sitting down on and standing up from a chair with arms (e.g., wheelchair, bedside commode, etc,.)?: Unable Help needed moving to and from a bed to chair (including a wheelchair)?: A Lot Help needed walking in hospital room?: A Little Help needed climbing 3-5 steps with a railing? : A Lot 6 Click Score: 10    End of Session Equipment Utilized During Treatment: Gait belt Activity Tolerance: Patient tolerated treatment well Patient left: in chair;with call bell/phone within reach;with chair alarm set Nurse Communication: Mobility status PT Visit Diagnosis: Difficulty in walking, not elsewhere classified (R26.2)     Time: 9179-1505 PT Time Calculation (min)  (ACUTE ONLY): 27 min  Charges:  $Gait Training: 23-37 mins                     Teyona Nichelson D Trisha Morandi PT DPT    Inessa Wardrop D Armetta Henri 11/16/2017, 4:58 PM

## 2017-11-16 NOTE — Progress Notes (Signed)
    Subjective:  Patient reports pain as mild to moderate.  Denies N/V/CP/SOB. No c/o.  Objective:   VITALS:   Vitals:   11/15/17 2219 11/16/17 0216 11/16/17 0523 11/16/17 1020  BP: 100/75 102/65 110/68 (!) 94/47  Pulse: 62 62 61 69  Resp: 16 17 16 16   Temp: 98.2 F (36.8 C) 98 F (36.7 C) 98.2 F (36.8 C) 98.2 F (36.8 C)  TempSrc: Oral Oral Oral Oral  SpO2: 97% 100% 100% 97%  Weight:      Height:        NAD ABD soft Sensation intact distally Intact pulses distally Dorsiflexion/Plantar flexion intact Incision: dressing C/D/I Compartment soft   Lab Results  Component Value Date   WBC 5.0 11/16/2017   HGB 10.3 (L) 11/16/2017   HCT 32.7 (L) 11/16/2017   MCV 92.9 11/16/2017   PLT 205 11/16/2017   BMET    Component Value Date/Time   NA 140 11/16/2017 0513   NA 142 03/26/2017   K 4.0 11/16/2017 0513   CL 106 11/16/2017 0513   CO2 29 11/16/2017 0513   GLUCOSE 126 (H) 11/16/2017 0513   BUN 27 (H) 11/16/2017 0513   BUN 32 (A) 03/26/2017   CREATININE 1.18 (H) 11/16/2017 0513   CALCIUM 8.7 (L) 11/16/2017 0513   GFRNONAA 46 (L) 11/16/2017 0513   GFRAA 53 (L) 11/16/2017 0513     Assessment/Plan: 1 Day Post-Op   Principal Problem:   Osteoarthritis of right knee   WBAT with walker DVT ppx: Aspirin, SCDs, TEDS PO pain control PT/OT Dispo: D/C home tomorrow with outpatient PT   Hilton Cork Mandy Fitzwater 11/16/2017, 10:57 AM   Rod Can, MD Cell [phone]

## 2017-11-16 NOTE — Discharge Summary (Signed)
Physician Discharge Summary  Patient ID: Cheryl Lane MRN: [id number] DOB/AGE: Sep 12, 1950 67 y.o.  Admit date: 11/15/2017 Discharge date: 11/17/2017  Admission Diagnoses:  Osteoarthritis of right knee  Discharge Diagnoses:  Principal Problem:   Osteoarthritis of right knee   Past Medical History:  Diagnosis Date  . Acute respiratory failure with hypoxia ([place]) 06/07/2016   RESOLVED   . Anemia    as a child  . Anxiety 06/07/2016  . Arthritis   . Asthma    bronchitis  . CAP (community acquired pneumonia) 12/21/2015  . Cervical radiculopathy   . Chronic kidney disease (CKD), stage IV (severe) ([place]) 05/30/2016  . Chronic pain in left shoulder   . CKD (chronic kidney disease)    pt not aware of this  . CKD (chronic kidney disease) stage 3, GFR 30-59 ml/min (HCC)   . Depression   . Hypertension   . Hypothyroidism   . Pneumonia   . Pre-diabetes   . PVC's (premature ventricular contractions) 12/22/2015  . 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 female who was admitted 11/15/2017 with a diagnosis of Osteoarthritis of right knee and went to the operating room on 11/15/2017 and underwent the above named procedures.    She was given perioperative antibiotics:  Anti-infectives (From admission, onward)   Start     Dose/Rate Route Frequency Ordered Stop   11/15/17 1600  ceFAZolin (ANCEF) IVPB 2g/100 mL premix     2 g 200 mL/hr over 30 Minutes Intravenous Every 6 hours 11/15/17 1217 11/15/17 2332   11/15/17 0600  ceFAZolin (ANCEF) IVPB 2g/100 mL premix     2 g 200 mL/hr over 30 Minutes Intravenous On call to O.R. 11/15/17 2130 11/15/17 0810    .  She was given sequential compression devices, early ambulation, and ASA for DVT prophylaxis.  She benefited  maximally from the hospital stay and there were no complications.    Recent vital signs:  Vitals:   11/17/17 0627 11/17/17 1003  BP: (!) 99/58 91/62  Pulse: 75   Resp: 16   Temp: 98.3 F (36.8 C)   SpO2: 99%     Recent laboratory studies:  Lab Results  Component Value Date   HGB 9.7 (L) 11/17/2017   HGB 10.3 (L) 11/16/2017   HGB 12.5 11/08/2017   Lab Results  Component Value Date   WBC 8.0 11/17/2017   PLT 205 11/17/2017   No results found for: INR Lab Results  Component Value Date   NA 140 11/16/2017   K 4.0 11/16/2017   CL 106 11/16/2017   CO2 29 11/16/2017   BUN 27 (H) 11/16/2017   CREATININE 1.18 (H) 11/16/2017   GLUCOSE 126 (H) 11/16/2017    Discharge Medications:   Allergies as of 11/17/2017   No Known Allergies     Medication List    STOP taking these medications   albuterol 108 (90 Base) MCG/ACT inhaler Commonly known as:  PROVENTIL HFA;VENTOLIN HFA   amLODipine 10 MG tablet Commonly known as:  NORVASC   benzonatate 200 MG capsule Commonly known as:  TESSALON   fluticasone 50 MCG/ACT nasal spray Commonly known as:  FLONASE   guaiFENesin 600 MG 12 hr tablet Commonly known as:  MUCINEX   loratadine 10 MG tablet Commonly known as:  CLARITIN  traMADol 50 MG tablet Commonly known as:  ULTRAM     TAKE these medications   aspirin 81 MG chewable tablet Chew 1 tablet (81 mg total) by mouth 2 (two) times daily.   docusate sodium 100 MG capsule Commonly known as:  COLACE Take 1 capsule (100 mg total) by mouth 2 (two) times daily.   escitalopram 10 MG tablet Commonly known as:  LEXAPRO Take 10 mg daily by mouth.   furosemide 20 MG tablet Commonly known as:  LASIX Take 20 mg by mouth daily.   HYDROcodone-acetaminophen 5-325 MG tablet Commonly known as:  NORCO/VICODIN Take 1-2 tablets by mouth every 6 (six) hours as needed (postop knee pain).   levothyroxine 75 MCG tablet Commonly known as:  SYNTHROID, LEVOTHROID Take 75 mcg daily  before breakfast by mouth.   lisinopril 10 MG tablet Commonly known as:  PRINIVIL,ZESTRIL Take 2 tablets (20 mg total) by mouth daily.   methocarbamol 500 MG tablet Commonly known as:  ROBAXIN Take 1 tablet (500 mg total) by mouth 3 (three) times daily as needed for muscle spasms.   ondansetron 4 MG tablet Commonly known as:  ZOFRAN Take 1 tablet (4 mg total) by mouth every 6 (six) hours as needed for nausea.   pravastatin 20 MG tablet Commonly known as:  PRAVACHOL Take 20 mg by mouth 2 (two) times a week.   senna 8.6 MG Tabs tablet Commonly known as:  SENOKOT Take 1 tablet (8.6 mg total) by mouth 2 (two) times daily.       Diagnostic Studies: Dg Knee Right Port  Result Date: 11/15/2017 CLINICAL DATA:  Total right knee replacement. EXAM: PORTABLE RIGHT KNEE - 1-2 VIEW COMPARISON:  No prior. FINDINGS: Total right knee replacement. Anatomic alignment. Hardware intact. No acute bony abnormality. IMPRESSION: Total right knee replacement with anatomic alignment. Electronically Signed   By: [place]   On: 11/15/2017 10:49    Disposition:     Follow-up Information    Shalae Belmonte, Aaron Edelman, MD. Schedule an appointment as soon as possible for a visit in 2 weeks.   Specialty:  Orthopedic Surgery Why:  For wound re-check Contact information: [address] [place] [address] [phone]            Signed: Hilton Cork Kordell Jafri 11/18/2017, 2:18 PM

## 2017-11-17 LAB — CBC
HEMATOCRIT: 30.4 % — AB (ref 36.0–46.0)
Hemoglobin: 9.7 g/dL — ABNORMAL LOW (ref 12.0–15.0)
MCH: 29.3 pg (ref 26.0–34.0)
MCHC: 31.9 g/dL (ref 30.0–36.0)
MCV: 91.8 fL (ref 78.0–100.0)
Platelets: 205 10*3/uL (ref 150–400)
RBC: 3.31 MIL/uL — ABNORMAL LOW (ref 3.87–5.11)
RDW: 14.4 % (ref 11.5–15.5)
WBC: 8 10*3/uL (ref 4.0–10.5)

## 2017-11-17 MED ORDER — METHOCARBAMOL 500 MG PO TABS: 500.0000 mg | ORAL_TABLET | Freq: Three times a day (TID) | ORAL | 1 refills | Status: DC | PRN

## 2017-11-17 NOTE — Progress Notes (Signed)
Cheryl Lane  MRN: [id number] DOB/Age: [birth year] 67 y.o. [facility] Procedure: Procedure(s) (LRB): RIGHT TOTAL KNEE ARTHROPLASTY WITH COMPUTER NAVIGATION (Right)     Subjective: Doing much better today, patient well known to me following L [facility]. Ready for Dc home  Vital Signs Temp:  [98.2 F (36.8 C)-98.3 F (36.8 C)] 98.3 F (36.8 C) (07/27 0627) Pulse Rate:  [69-78] 75 (07/27 0627) Resp:  [16] 16 (07/27 0627) BP: (94-119)/(47-70) 99/58 (07/27 0627) SpO2:  [93 %-99 %] 99 % (07/27 0627)  Lab Results Recent Labs    11/16/17 0513 11/17/17 0538  WBC 5.0 8.0  HGB 10.3* 9.7*  HCT 32.7* 30.4*  PLT 205 205   BMET Recent Labs    11/16/17 0513  NA 140  K 4.0  CL 106  CO2 29  GLUCOSE 126*  BUN 27*  CREATININE 1.18*  CALCIUM 8.7*   No results found for: INR   Exam Dressing removed          Plan Dc home today She requested rx for robaxin which I provided Outpatient PT on Monday  [facility] Tye Juarez PA-C  11/17/2017, 8:39 AM Contact # [phone]

## 2017-11-17 NOTE — Progress Notes (Signed)
Discharged from floor via w/c for transport home by car. Belongings with pt, meeting daughter at entrance. No changes in assessment. Araceli Coufal, [employer]

## 2017-11-17 NOTE — Progress Notes (Signed)
Physical Therapy Treatment Patient Details Name: Cheryl Lane MRN: [id number] DOB: 06-25-1950 Today's Date: 11/17/2017    History of Present Illness Pt s/p R TKR and with hx of CKD, Reverse L TSR and cervical radiculopathy    PT Comments    Pt very motivated and progressing well with mobility this am.  Reviewed stairs and home therex program.  Daughter in this pm for family education.   Follow Up Recommendations  Follow surgeon's recommendation for DC plan and follow-up therapies     Equipment Recommendations  None recommended by PT    Recommendations for Other Services       Precautions / Restrictions Precautions Precautions: Knee;Fall Restrictions Weight Bearing Restrictions: No Other Position/Activity Restrictions: WBAT    Mobility  Bed Mobility Overal bed mobility: Needs Assistance Bed Mobility: Supine to Sit     Supine to sit: HOB elevated;Supervision     General bed mobility comments: pt self cues for sequence  Transfers Overall transfer level: Needs assistance Equipment used: Rolling walker (2 wheeled) Transfers: Sit to/from Stand Sit to Stand: Min guard         General transfer comment: Pt self cues for LE management and use of UEs to self assist  Ambulation/Gait Ambulation/Gait assistance: Min guard;Supervision Gait Distance (Feet): 90 Feet Assistive device: Rolling walker (2 wheeled) Gait Pattern/deviations: Step-to pattern;Decreased step length - right;Decreased step length - left;Trunk flexed;Decreased stance time - right Gait velocity: decreased    General Gait Details: min cues for posture and position from [employer] Stairs: Yes Stairs assistance: Min assist Stair Management: One rail Right;Step to pattern;Forwards;With crutches Number of Stairs: 4 General stair comments: cues for sequence and foot/crutch placement   Wheelchair Mobility    Modified Rankin (Stroke Patients Only)       Balance Overall balance assessment:  Needs assistance Sitting-balance support: No upper extremity supported Sitting balance-Leahy Scale: Good     Standing balance support: Bilateral upper extremity supported Standing balance-Leahy Scale: Fair                              Cognition Arousal/Alertness: Awake/alert Behavior During Therapy: WFL for tasks assessed/performed Overall Cognitive Status: Within Functional Limits for tasks assessed                                        Exercises Total Joint Exercises Ankle Circles/Pumps: AROM;20 reps;Supine Quad Sets: AROM;15 reps;Supine Short Arc Quad: AROM;Right;10 reps;Supine Heel Slides: AAROM;Supine;20 reps;Right Straight Leg Raises: 10 reps;Supine;AAROM;AROM;Right Long Arc Quad: AROM;Right;10 reps;Seated    General Comments        Pertinent Vitals/Pain Pain Assessment: 0-10 Pain Score: 3  Pain Location: R knee Pain Descriptors / Indicators: Aching;Sore Pain Intervention(s): Limited activity within patient's tolerance;Monitored during session;Premedicated before session;Ice applied    Home Living                      Prior Function            PT Goals (current goals can now be found in the care plan section) Acute Rehab PT Goals Patient Stated Goal: Regain IND PT Goal Formulation: With patient Time For Goal Achievement: 11/22/17 Potential to Achieve Goals: Good Progress towards PT goals: Progressing toward goals    Frequency    7X/week      PT Plan Current  plan remains appropriate    Co-evaluation              AM-PAC PT "6 Clicks" Daily Activity  Outcome Measure  Difficulty turning over in bed (including adjusting bedclothes, sheets and blankets)?: A Lot Difficulty moving from lying on back to sitting on the side of the bed? : A Lot Difficulty sitting down on and standing up from a chair with arms (e.g., wheelchair, bedside commode, etc,.)?: A Lot Help needed moving to and from a bed to chair  (including a wheelchair)?: A Little Help needed walking in hospital room?: A Little Help needed climbing 3-5 steps with a railing? : A Little 6 Click Score: 15    End of Session Equipment Utilized During Treatment: Gait belt Activity Tolerance: Patient tolerated treatment well Patient left: in chair;with call bell/phone within reach;with chair alarm set Nurse Communication: Mobility status PT Visit Diagnosis: Difficulty in walking, not elsewhere classified (R26.2)     Time: 8938-1017 PT Time Calculation (min) (ACUTE ONLY): 55 min  Charges:  $Gait Training: 8-22 mins $Therapeutic Exercise: 23-37 mins $Therapeutic Activity: 8-22 mins                     Pg [phone]    Kharisma Glasner 11/17/2017, 1:13 PM

## 2017-11-17 NOTE — Plan of Care (Signed)
Plan of care discussed.   

## 2017-11-20 ENCOUNTER — Ambulatory Visit: Payer: Medicare HMO | Attending: Orthopedic Surgery | Admitting: Physical Therapy

## 2017-11-20 ENCOUNTER — Encounter: Payer: Self-pay | Admitting: Physical Therapy

## 2017-11-20 DIAGNOSIS — M25561 Pain in right knee: Secondary | ICD-10-CM | POA: Insufficient documentation

## 2017-11-20 DIAGNOSIS — M25661 Stiffness of right knee, not elsewhere classified: Secondary | ICD-10-CM

## 2017-11-20 DIAGNOSIS — R262 Difficulty in walking, not elsewhere classified: Secondary | ICD-10-CM | POA: Diagnosis not present

## 2017-11-20 DIAGNOSIS — R600 Localized edema: Secondary | ICD-10-CM | POA: Diagnosis not present

## 2017-11-20 NOTE — Therapy (Signed)
[place], [place], [address] Phone: [phone]   Fax:  [phone]  Physical Therapy Evaluation  Patient Details  Name: Cheryl Lane MRN: [id number] Date of Birth: 22-Jul-1950 Referring Provider: Delfino Lovett   Encounter Date: 11/20/2017  PT End of Session - 11/20/17 1650    Visit Number  1    Date for PT Re-Evaluation  01/21/18    PT Start Time  1540    PT Stop Time  1630    PT Time Calculation (min)  50 min    Activity Tolerance  Patient tolerated treatment well;Patient limited by fatigue    Behavior During Therapy  [facility] for tasks assessed/performed       Past Medical History:  Diagnosis Date  . Acute respiratory failure with hypoxia ([place]) 06/07/2016   RESOLVED   . Anemia    as a child  . Anxiety 06/07/2016  . Arthritis   . Asthma    bronchitis  . CAP (community acquired pneumonia) 12/21/2015  . Cervical radiculopathy   . Chronic kidney disease (CKD), stage IV (severe) ([place]) 05/30/2016  . Chronic pain in left shoulder   . CKD (chronic kidney disease)    pt not aware of this  . CKD (chronic kidney disease) stage 3, GFR 30-59 ml/min (HCC)   . Depression   . Hypertension   . Hypothyroidism   . Pneumonia   . Pre-diabetes   . PVC's (premature ventricular contractions) 12/22/2015  . 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 APPENDECTOMY    . KNEE ARTHROPLASTY Right 11/15/2017   Procedure: RIGHT TOTAL KNEE ARTHROPLASTY WITH COMPUTER NAVIGATION;  Surgeon: Rod Can, MD;  Location: [facility];  Service: Orthopedics;  Laterality: Right;  Needs RNFA  . REVERSE SHOULDER ARTHROPLASTY Left 03/01/2017   Procedure: REVERSE SHOULDER ARTHROPLASTY;  Surgeon: Justice Britain, MD;  Location: [place];  Service: Orthopedics;  Laterality: Left;  . TUBAL LIGATION      There were no vitals filed for this visit.   Subjective  Assessment - 11/20/17 1553    Subjective  Patient reports that she had pain in the knees for a number of years.  Had a fall in November of 2018 and had a TSR n the left.  She underwent a right TKR on 11/15/17.  She went home on 11/18/17    Limitations  Lifting;Standing;Walking;House hold activities    Patient Stated Goals  walk without pain, have good ROM    Currently in Pain?  Yes    Pain Score  6     Pain Location  Knee    Pain Orientation  Right    Pain Descriptors / Indicators  Aching;Sore;Tightness    Pain Type  Acute pain;Surgical pain    Pain Onset  In the past 7 days    Pain Frequency  Constant    Aggravating Factors   walking, standing, bending pain up to 8/10    Pain Relieving Factors  rest, pain meds, at best pain a 2-3/10    Effect of Pain on Daily Activities  limits everything         [facility] PT Assessment - 11/20/17 0001      Assessment   Medical Diagnosis  s/p right TKR    Referring Provider  [place]    Onset Date/Surgical Date  11/15/17    Prior Therapy  no  Precautions   Precautions  None      Balance Screen   Has the patient fallen in the past 6 months  No    Has the patient had a decrease in activity level because of a fear of falling?   No    Is the patient reluctant to leave their home because of a fear of falling?   No      Home Environment   Additional Comments  lives with daughter and 3 granddaughters, does some cleaning      Prior Function   Level of Independence  Independent with household mobility with device    Vocation  Retired    Leisure  no exercise      Observation/Other Assessments-Edema    Edema  Circumferential      Circumferential Edema   Circumferential - Right  52.5 cm    Circumferential - Left   43.5 cm      ROM / Strength   AROM / PROM / Strength  AROM;PROM;Strength      AROM   AROM Assessment Site  Knee    Right/Left Knee  Right    Right Knee Extension  23    Right Knee Flexion  80      PROM   PROM Assessment Site   Knee    Right/Left Knee  Right    Right Knee Extension  15    Right Knee Flexion  86      Strength   Strength Assessment Site  Knee    Right/Left Knee  Right    Right Knee Flexion  3+/5    Right Knee Extension  3-/5      Palpation   Palpation comment  bruising in the posterior knee, reports some numbness in the distal shin area      Ambulation/Gait   Gait Comments  uses a walker, step to gait, very slow, very short of breath has to stop about every 40-50 feet to rest      Standardized Balance Assessment   Standardized Balance Assessment  Timed Up and Go Test      Timed Up and Go Test   Normal TUG (seconds)  65                Objective measurements completed on examination: See above findings.      [facility] Adult PT Treatment/Exercise - 11/20/17 0001      Exercises   Exercises  Knee/Hip      Knee/Hip Exercises: Aerobic   Nustep  level 3 x 5 minutes             PT Education - 11/20/17 1650    Education Details  quad set, low load long duration stretches    Person(s) Educated  Patient;Child(ren)    Methods  Explanation;Demonstration;Handout    Comprehension  Verbalized understanding       PT Short Term Goals - 11/20/17 1654      PT SHORT TERM GOAL #1   Title  independent iwth initial HEP    Time  2    Period  Weeks    Status  New        PT Long Term Goals - 11/20/17 1654      PT LONG TERM GOAL #1   Title  decrease TUG time to 27 seconds    Time  8    Period  Weeks    Status  New      PT LONG TERM GOAL #2  Title  increase AROM to 10-110 degrees flexion    Time  8    Period  Weeks    Status  New      PT LONG TERM GOAL #3   Title  walk with SPC in the home    Time  8    Period  Weeks    Status  New      PT LONG TERM GOAL #4   Title  be able to go up and down stairs with SBA, one at a time    Time  8    Period  Weeks    Status  New      PT LONG TERM GOAL #5   Title  decrease swelling 3 cm    Time  8    Period  Weeks     Status  New             Plan - 11/20/17 1651    Clinical Impression Statement  Patient had a right TKA on 11/15/17, short hospital stay and then to outpatient PT.  She reports tha tshe has been having some knee pain for years, she has significant swelling of the right knee.  Her AROM was 23-80 degrees flexion.  Her gait was extremely slow, step to pattern.  TUG 65 seconds    Clinical Presentation  Evolving    Clinical Decision Making  Moderate    Rehab Potential  Good    PT Frequency  3x / week    PT Duration  8 weeks    PT Treatment/Interventions  ADLs/Self Care Home Management;Cryotherapy;Gait training;Stair training;Functional mobility training;Therapeutic activities;Therapeutic exercise;Balance training;Neuromuscular re-education;Patient/family education;Vasopneumatic Device    PT Next Visit Plan  start activities and gait, use Vaso for swelling    Consulted and Agree with Plan of Care  Patient       Patient will benefit from skilled therapeutic intervention in order to improve the following deficits and impairments:  Abnormal gait, Decreased range of motion, Difficulty walking, Decreased endurance, Cardiopulmonary status limiting activity, Decreased activity tolerance, Pain, Impaired flexibility, Decreased scar mobility, Decreased balance, Decreased mobility, Decreased strength, Increased edema  Visit Diagnosis: Acute pain of right knee - Plan: PT plan of care cert/re-cert  Stiffness of right knee, not elsewhere classified - Plan: PT plan of care cert/re-cert  Localized edema - Plan: PT plan of care cert/re-cert  Difficulty in walking, not elsewhere classified - Plan: PT plan of care cert/re-cert     Problem List Patient Active Problem List   Diagnosis Date Noted  . Osteoarthritis of right knee 11/15/2017  . Humerus fracture 03/09/2017  . Asthma 03/09/2017  . Hypothyroidism 03/09/2017  . 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 Genetic testing  02/06/2017  . Acute respiratory failure with hypoxia ([place]) 06/07/2016  . RSV (acute bronchiolitis due to respiratory syncytial virus) 06/07/2016  . Anxiety 06/07/2016  . Arthritis   . Depression 05/30/2016  . Chronic kidney disease (CKD), stage IV (severe) ([place]) 05/30/2016  . Mild intermittent asthma with acute exacerbation   . Respiratory distress   . CKD (chronic kidney disease) stage 3, GFR 30-59 ml/min (HCC)   . Chronic pain syndrome   . Bronchitis 05/29/2016  . Left shoulder pain   . Opacity of lung on imaging study   . Shoulder pain, acute   . Weakness generalized 12/22/2015  . Essential hypertension 12/22/2015  . Pneumonia 12/22/2015  . PVC's (premature ventricular contractions) 12/22/2015  . CAP (community acquired pneumonia)  12/21/2015    Sumner Boast., PT 11/20/2017, 4:57 PM  [address] [place], [place], [address] Phone: [phone]   Fax:  [phone]  Name: Cheryl Lane MRN: [id number] Date of Birth: [birth year]

## 2017-11-22 ENCOUNTER — Ambulatory Visit: Payer: Medicare HMO | Attending: Orthopedic Surgery | Admitting: Physical Therapy

## 2017-11-22 ENCOUNTER — Encounter: Payer: Self-pay | Admitting: Physical Therapy

## 2017-11-22 DIAGNOSIS — R262 Difficulty in walking, not elsewhere classified: Secondary | ICD-10-CM | POA: Diagnosis not present

## 2017-11-22 DIAGNOSIS — M25561 Pain in right knee: Secondary | ICD-10-CM | POA: Insufficient documentation

## 2017-11-22 DIAGNOSIS — M25661 Stiffness of right knee, not elsewhere classified: Secondary | ICD-10-CM | POA: Diagnosis not present

## 2017-11-22 DIAGNOSIS — R600 Localized edema: Secondary | ICD-10-CM | POA: Diagnosis not present

## 2017-11-22 NOTE — Therapy (Signed)
[place] [address] [place], [place], [address] Phone: [phone]   Fax:  [phone]  Physical Therapy Treatment  Patient Details  Name: Cheryl Lane MRN: [id number] Date of Birth: [birth year] Referring Provider: Delfino Lovett   Encounter Date: 11/22/2017  PT End of Session - 11/22/17 1751    Visit Number  2    Date for PT Re-Evaluation  01/21/18    PT Start Time  1610    PT Stop Time  2633    PT Time Calculation (min)  65 min    Activity Tolerance  Patient tolerated treatment well;Patient limited by fatigue    Behavior During Therapy  [facility] for tasks assessed/performed;Anxious       Past Medical History:  Diagnosis Date  . Acute respiratory failure with hypoxia ([place]) 06/07/2016   RESOLVED   . Anemia    as a child  . Anxiety 06/07/2016  . Arthritis   . Asthma    bronchitis  . CAP (community acquired pneumonia) 12/21/2015  . Cervical radiculopathy   . Chronic kidney disease (CKD), stage IV (severe) ([place]) 05/30/2016  . Chronic pain in left shoulder   . CKD (chronic kidney disease)    pt not aware of this  . CKD (chronic kidney disease) stage 3, GFR 30-59 ml/min (HCC)   . Depression   . Hypertension   . Hypothyroidism   . Pneumonia   . Pre-diabetes   . PVC's (premature ventricular contractions) 12/22/2015  . 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 APPENDECTOMY    . KNEE ARTHROPLASTY Right 11/15/2017   Procedure: RIGHT TOTAL KNEE ARTHROPLASTY WITH COMPUTER NAVIGATION;  Surgeon: Rod Can, MD;  Location: [facility];  Service: Orthopedics;  Laterality: Right;  Needs RNFA  . REVERSE SHOULDER ARTHROPLASTY Left 03/01/2017   Procedure: REVERSE SHOULDER ARTHROPLASTY;  Surgeon: Justice Britain, MD;  Location: [facility];  Service: Orthopedics;  Laterality: Left;  . TUBAL LIGATION      There were no vitals filed for this visit.  Subjective  Assessment - 11/22/17 1624    Subjective  Patient reports that she is very swollen and reports pain in the knee    Currently in Pain?  Yes    Pain Score  5     Pain Location  Knee    Pain Orientation  Right                       [facility] Adult PT Treatment/Exercise - 11/22/17 0001      Knee/Hip Exercises: Aerobic   Nustep  level 3 x 6 minutes      Knee/Hip Exercises: Machines for Strengthening   Cybex Knee Extension  5# 2x10    Cybex Knee Flexion  20# 3x10    Cybex Leg Press  tried 20# but the left knee has significant crepitus able to do the right leg only without weight but it was very difficult to get her on and off due to fear/anxiety      Modalities   Modalities  Vasopneumatic      Vasopneumatic   Number Minutes Vasopneumatic   15 minutes    Vasopnuematic Location   Knee    Vasopneumatic Pressure  Medium    Vasopneumatic Temperature   42      Manual Therapy   Manual Therapy  Passive ROM    Passive ROM  to the  right knee focus on flexion               PT Short Term Goals - 11/20/17 1654      PT SHORT TERM GOAL #1   Title  independent iwth initial HEP    Time  2    Period  Weeks    Status  New        PT Long Term Goals - 11/20/17 1654      PT LONG TERM GOAL #1   Title  decrease TUG time to 27 seconds    Time  8    Period  Weeks    Status  New      PT LONG TERM GOAL #2   Title  increase AROM to 10-110 degrees flexion    Time  8    Period  Weeks    Status  New      PT LONG TERM GOAL #3   Title  walk with SPC in the home    Time  8    Period  Weeks    Status  New      PT LONG TERM GOAL #4   Title  be able to go up and down stairs with SBA, one at a time    Time  8    Period  Weeks    Status  New      PT LONG TERM GOAL #5   Title  decrease swelling 3 cm    Time  8    Period  Weeks    Status  New            Plan - 11/22/17 1751    Clinical Impression Statement  Patient did well and tried everything I asked her to  do today even though she has fear of falling and has anxiety.  She had a very difficult time on and off the leg press.  Needs some cues to bend the knee when walking.    PT Next Visit Plan  start activities and gait, use Vaso for swelling    Consulted and Agree with Plan of Care  Patient       Patient will benefit from skilled therapeutic intervention in order to improve the following deficits and impairments:  Abnormal gait, Decreased range of motion, Difficulty walking, Decreased endurance, Cardiopulmonary status limiting activity, Decreased activity tolerance, Pain, Impaired flexibility, Decreased scar mobility, Decreased balance, Decreased mobility, Decreased strength, Increased edema  Visit Diagnosis: Acute pain of right knee  Stiffness of right knee, not elsewhere classified  Localized edema  Difficulty in walking, not elsewhere classified     Problem List Patient Active Problem List   Diagnosis Date Noted  . Osteoarthritis of right knee 11/15/2017  . Humerus fracture 03/09/2017  . Asthma 03/09/2017  . Hypothyroidism 03/09/2017  . 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 Genetic testing 02/06/2017  . Acute respiratory failure with hypoxia ([place]) 06/07/2016  . RSV (acute bronchiolitis due to respiratory syncytial virus) 06/07/2016  . Anxiety 06/07/2016  . Arthritis   . Depression 05/30/2016  . Chronic kidney disease (CKD), stage IV (severe) ([place]) 05/30/2016  . Mild intermittent asthma with acute exacerbation   . Respiratory distress   . CKD (chronic kidney disease) stage 3, GFR 30-59 ml/min (HCC)   . Chronic pain syndrome   . Bronchitis 05/29/2016  . Left shoulder pain   . Opacity of lung on imaging study   . Shoulder pain, acute   .  Weakness generalized 12/22/2015  . Essential hypertension 12/22/2015  . Pneumonia 12/22/2015  . PVC's (premature ventricular contractions) 12/22/2015  . CAP (community acquired pneumonia) 12/21/2015     Sumner Boast., PT 11/22/2017, 5:53 PM  [address] [place], [place], [address] Phone: [phone]   Fax:  [phone]  Name: Cheryl Lane MRN: [id number] Date of Birth: [birth year]

## 2017-11-25 DIAGNOSIS — M5136 Other intervertebral disc degeneration, lumbar region: Secondary | ICD-10-CM | POA: Diagnosis not present

## 2017-11-25 DIAGNOSIS — Z96612 Presence of left artificial shoulder joint: Secondary | ICD-10-CM | POA: Diagnosis not present

## 2017-11-25 DIAGNOSIS — M170 Bilateral primary osteoarthritis of knee: Secondary | ICD-10-CM | POA: Diagnosis not present

## 2017-11-25 DIAGNOSIS — N184 Chronic kidney disease, stage 4 (severe): Secondary | ICD-10-CM | POA: Diagnosis not present

## 2017-11-25 DIAGNOSIS — M5412 Radiculopathy, cervical region: Secondary | ICD-10-CM | POA: Diagnosis not present

## 2017-11-25 DIAGNOSIS — S42292D Other displaced fracture of upper end of left humerus, subsequent encounter for fracture with routine healing: Secondary | ICD-10-CM | POA: Diagnosis not present

## 2017-11-25 DIAGNOSIS — M1990 Unspecified osteoarthritis, unspecified site: Secondary | ICD-10-CM | POA: Diagnosis not present

## 2017-11-25 DIAGNOSIS — M9983 Other biomechanical lesions of lumbar region: Secondary | ICD-10-CM | POA: Diagnosis not present

## 2017-11-27 ENCOUNTER — Encounter: Payer: Self-pay | Admitting: Physical Therapy

## 2017-11-27 ENCOUNTER — Ambulatory Visit: Payer: Medicare HMO | Admitting: Physical Therapy

## 2017-11-27 DIAGNOSIS — R262 Difficulty in walking, not elsewhere classified: Secondary | ICD-10-CM | POA: Diagnosis not present

## 2017-11-27 DIAGNOSIS — M25561 Pain in right knee: Secondary | ICD-10-CM

## 2017-11-27 DIAGNOSIS — R600 Localized edema: Secondary | ICD-10-CM | POA: Diagnosis not present

## 2017-11-27 DIAGNOSIS — M25661 Stiffness of right knee, not elsewhere classified: Secondary | ICD-10-CM | POA: Diagnosis not present

## 2017-11-27 NOTE — Therapy (Signed)
[place], [place], [address] Phone: [phone]   Fax:  [phone]  Physical Therapy Treatment  Patient Details  Name: MACAYLA EKDAHL MRN: [id number] Date of Birth: 1950-07-16 Referring Provider: Delfino Lovett   Encounter Date: 11/27/2017  PT End of Session - 11/27/17 1006    Visit Number  3    Date for PT Re-Evaluation  01/21/18    PT Start Time  0928    PT Stop Time  1024    PT Time Calculation (min)  56 min    Activity Tolerance  Patient tolerated treatment well;Patient limited by fatigue    Behavior During Therapy  [facility] for tasks assessed/performed;Anxious       Past Medical History:  Diagnosis Date  . Acute respiratory failure with hypoxia ([place]) 06/07/2016   RESOLVED   . Anemia    as a child  . Anxiety 06/07/2016  . Arthritis   . Asthma    bronchitis  . CAP (community acquired pneumonia) 12/21/2015  . Cervical radiculopathy   . Chronic kidney disease (CKD), stage IV (severe) ([place]) 05/30/2016  . Chronic pain in left shoulder   . CKD (chronic kidney disease)    pt not aware of this  . CKD (chronic kidney disease) stage 3, GFR 30-59 ml/min (HCC)   . Depression   . Hypertension   . Hypothyroidism   . Pneumonia   . Pre-diabetes   . PVC's (premature ventricular contractions) 12/22/2015  . 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 APPENDECTOMY    . KNEE ARTHROPLASTY Right 11/15/2017   Procedure: RIGHT TOTAL KNEE ARTHROPLASTY WITH COMPUTER NAVIGATION;  Surgeon: Rod Can, MD;  [facility];  Service: Orthopedics;  Laterality: Right;  Needs RNFA  . REVERSE SHOULDER ARTHROPLASTY Left 03/01/2017   Procedure: REVERSE SHOULDER ARTHROPLASTY;  Surgeon: Justice Britain, MD;  Location: [facility];  Service: Orthopedics;  Laterality: Left;  . TUBAL LIGATION      There were no vitals filed for this visit.  Subjective  Assessment - 11/27/17 0936    Subjective  I am feeling better and moving a little better    Currently in Pain?  Yes    Pain Score  5     Pain Location  Knee    Pain Orientation  Right    Aggravating Factors   walking and bending                       [facility] Adult PT Treatment/Exercise - 11/27/17 0001      High Level Balance   High Level Balance Activities  Side stepping;Backward walking    High Level Balance Comments  then worked on gait for bigger steps and with HHA only, step through pattern      Knee/Hip Exercises: Aerobic   Nustep  level 4 x 6 minutes      Knee/Hip Exercises: Machines for Strengthening   Cybex Knee Extension  5# 3x10    Cybex Knee Flexion  20# 3x10, right only 2x10      Knee/Hip Exercises: Seated   Other Seated Knee/Hip Exercises  red tband ankle PF/DF x20 each      Modalities   Modalities  Vasopneumatic      Vasopneumatic   Number Minutes Vasopneumatic   15 minutes    Vasopnuematic Location   Knee    Vasopneumatic Pressure  Medium    Vasopneumatic Temperature   42      Manual Therapy   Manual Therapy  Passive ROM    Manual therapy comments  some STM to the thigh and the scar area, still with large bandage on, sees MD Friday    Passive ROM  to the right knee focus on flexion               PT Short Term Goals - 11/27/17 1008      PT SHORT TERM GOAL #1   Status  Partially Met        PT Long Term Goals - 11/20/17 1654      PT LONG TERM GOAL #1   Title  decrease TUG time to 27 seconds    Time  8    Period  Weeks    Status  New      PT LONG TERM GOAL #2   Title  increase AROM to 10-110 degrees flexion    Time  8    Period  Weeks    Status  New      PT LONG TERM GOAL #3   Title  walk with SPC in the home    Time  8    Period  Weeks    Status  New      PT LONG TERM GOAL #4   Title  be able to go up and down stairs with SBA, one at a time    Time  8    Period  Weeks    Status  New      PT LONG TERM GOAL #5    Title  decrease swelling 3 cm    Time  8    Period  Weeks    Status  New            Plan - 11/27/17 1007    Clinical Impression Statement  Patient is less anxious today but still is fearful.  She is doing well with the flexion wven with large bandage on, needing cues for step through gait and biffer steps, some cues to bend the knee    PT Next Visit Plan  continue to increase the exericses and the gait    Consulted and Agree with Plan of Care  Patient       Patient will benefit from skilled therapeutic intervention in order to improve the following deficits and impairments:  Abnormal gait, Decreased range of motion, Difficulty walking, Decreased endurance, Cardiopulmonary status limiting activity, Decreased activity tolerance, Pain, Impaired flexibility, Decreased scar mobility, Decreased balance, Decreased mobility, Decreased strength, Increased edema  Visit Diagnosis: Acute pain of right knee  Stiffness of right knee, not elsewhere classified  Localized edema  Difficulty in walking, not elsewhere classified     Problem List Patient Active Problem List   Diagnosis Date Noted  . Osteoarthritis of right knee 11/15/2017  . Humerus fracture 03/09/2017  . Asthma 03/09/2017  . Hypothyroidism 03/09/2017  . 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 Genetic testing 02/06/2017  . Acute respiratory failure with hypoxia ([place]) 06/07/2016  . RSV (acute bronchiolitis due to respiratory syncytial virus) 06/07/2016  . Anxiety 06/07/2016  . Arthritis   . Depression 05/30/2016  . Chronic kidney disease (CKD), stage IV (severe) ([place]) 05/30/2016  . Mild intermittent asthma with acute exacerbation   . Respiratory distress   . CKD (chronic kidney disease) stage 3, GFR 30-59 ml/min (HCC)   . Chronic pain syndrome   .  Bronchitis 05/29/2016  . Left shoulder pain   . Opacity of lung on imaging study   . Shoulder pain, acute   . Weakness generalized 12/22/2015   . Essential hypertension 12/22/2015  . Pneumonia 12/22/2015  . PVC's (premature ventricular contractions) 12/22/2015  . CAP (community acquired pneumonia) 12/21/2015    Sumner Boast., PT 11/27/2017, 10:08 AM  [place] [address] [place], [place], [address] Phone: [phone]   Fax:  [phone]  Name: MAKELL DROHAN MRN: [id number] Date of Birth: 19-Mar-1951

## 2017-11-28 ENCOUNTER — Ambulatory Visit: Payer: Medicare HMO | Admitting: Physical Therapy

## 2017-11-28 DIAGNOSIS — R262 Difficulty in walking, not elsewhere classified: Secondary | ICD-10-CM

## 2017-11-28 DIAGNOSIS — R600 Localized edema: Secondary | ICD-10-CM | POA: Diagnosis not present

## 2017-11-28 DIAGNOSIS — M25661 Stiffness of right knee, not elsewhere classified: Secondary | ICD-10-CM

## 2017-11-28 DIAGNOSIS — M25561 Pain in right knee: Secondary | ICD-10-CM

## 2017-11-28 NOTE — Therapy (Signed)
[place], [place], [address] Phone: [phone]   Fax:  [phone]  Physical Therapy Treatment  Patient Details  Name: Cheryl Lane MRN: [id number] Date of Birth: 02-23-1951 Referring Provider: Dr. Delfino Lovett   Encounter Date: 11/28/2017  PT End of Session - 11/28/17 1023    Visit Number  4    Date for PT Re-Evaluation  01/21/18    PT Start Time  5852    PT Stop Time  1115    PT Time Calculation (min)  60 min    Activity Tolerance  Patient tolerated treatment well    Behavior During Therapy  [facility] for tasks assessed/performed       Past Medical History:  Diagnosis Date  . Acute respiratory failure with hypoxia ([place]) 06/07/2016   RESOLVED   . Anemia    as a child  . Anxiety 06/07/2016  . Arthritis   . Asthma    bronchitis  . CAP (community acquired pneumonia) 12/21/2015  . Cervical radiculopathy   . Chronic kidney disease (CKD), stage IV (severe) ([place]) 05/30/2016  . Chronic pain in left shoulder   . CKD (chronic kidney disease)    pt not aware of this  . CKD (chronic kidney disease) stage 3, GFR 30-59 ml/min (HCC)   . Depression   . Hypertension   . Hypothyroidism   . Pneumonia   . Pre-diabetes   . PVC's (premature ventricular contractions) 12/22/2015  . 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 APPENDECTOMY    . KNEE ARTHROPLASTY Right 11/15/2017   Procedure: RIGHT TOTAL KNEE ARTHROPLASTY WITH COMPUTER NAVIGATION;  Surgeon: Rod Can, MD;  [facility];  Service: Orthopedics;  Laterality: Right;  Needs RNFA  . REVERSE SHOULDER ARTHROPLASTY Left 03/01/2017   Procedure: REVERSE SHOULDER ARTHROPLASTY;  Surgeon: Justice Britain, MD;  Location: [facility];  Service: Orthopedics;  Laterality: Left;  . TUBAL LIGATION      There were no vitals filed for this visit.  Subjective Assessment - 11/27/17 0936     Subjective  I am feeling better and moving a little better.  She reports her Rt calf feels a little more sore today.  She brought her friend, Bethena Roys, with her.      Currently in Pain?  Yes    Pain Score  5     Pain Location  Knee    Pain Orientation  Right    Aggravating Factors   walking and bending         [facility] PT Assessment - 11/28/17 0001      Assessment   Medical Diagnosis  s/p right TKR    Referring Provider  Dr. Delfino Lovett    Onset Date/Surgical Date  11/15/17      AROM   Right Knee Extension  15 supine with quad set    Right Knee Flexion  95 with seated scoot        [facility] Adult PT Treatment/Exercise - 11/28/17 0001      Knee/Hip Exercises: Stretches   Passive Hamstring Stretch  Right;2 reps;30 seconds seated    Gastroc Stretch  Right;Left;2 reps;30 seconds multiple cues for form.       Knee/Hip Exercises: Aerobic   Nustep  L4: 6 min (arms and legs) PTA present to discuss progress      Knee/Hip Exercises: Standing   Gait Training  50 ft with  RW, VC for increased heel strike with RLE and to equal step length; tactile cues for posture.  Pt has slow pace, antalgic gait. Discussed proper height of RW (handle at wrist crease).   Other Standing Knee Exercises  discussed side stepping at counter at home for HEP.       Knee/Hip Exercises: Seated   Long Arc Quad  Right;1 set;15 reps    Other Seated Knee/Hip Exercises  seated scoots with 10 sec hold x 10 reps     Sit to Sand  10 reps;with UE support eccentric control       Modalities   Modalities  Vasopneumatic      Vasopneumatic   Number Minutes Vasopneumatic   15 minutes    Vasopnuematic Location   Knee Rt    Vasopneumatic Pressure  Medium    Vasopneumatic Temperature   34               PT Short Term Goals - 11/28/17 1229      PT SHORT TERM GOAL #1   Title  independent iwth initial HEP    Time  2    Period  Weeks    Status  Partially Met        PT Long Term Goals - 11/28/17 1229      PT LONG TERM  GOAL #1   Title  decrease TUG time to 27 seconds    Time  8    Period  Weeks    Status  On-going      PT LONG TERM GOAL #2   Title  increase AROM to 10-110 degrees flexion    Time  8    Period  Weeks    Status  On-going      PT LONG TERM GOAL #3   Title  walk with SPC in the home    Time  8    Period  Weeks    Status  On-going      PT LONG TERM GOAL #4   Title  be able to go up and down stairs with SBA, one at a time    Time  8    Period  Weeks    Status  On-going      PT LONG TERM GOAL #5   Title  decrease swelling 3 cm    Time  8    Period  Weeks    Status  On-going            Plan - 11/28/17 1104    Clinical Impression Statement  Pt demonstrated improved Rt knee ROM.   Pt continues to voice fear of falling, with standing exercises.  Pt tolerated all exercises well, without increase in pain.  Pt required increased time in between exercises and assistance with counting.  Pt is progressing well towards goals.     Rehab Potential  Good    PT Frequency  3x / week    PT Duration  8 weeks    PT Treatment/Interventions  ADLs/Self Care Home Management;Cryotherapy;Gait training;Stair training;Functional mobility training;Therapeutic activities;Therapeutic exercise;Balance training;Neuromuscular re-education;Patient/family education;Vasopneumatic Device    PT Next Visit Plan  continue        Patient will benefit from skilled therapeutic intervention in order to improve the following deficits and impairments:  Abnormal gait, Decreased range of motion, Difficulty walking, Decreased endurance, Cardiopulmonary status limiting activity, Decreased activity tolerance, Pain, Impaired flexibility, Decreased scar mobility, Decreased balance, Decreased mobility, Decreased strength, Increased edema  Visit Diagnosis: Acute  pain of right knee  Stiffness of right knee, not elsewhere classified  Localized edema  Difficulty in walking, not elsewhere classified     Problem  List Patient Active Problem List   Diagnosis Date Noted  . Osteoarthritis of right knee 11/15/2017  . Humerus fracture 03/09/2017  . Asthma 03/09/2017  . Hypothyroidism 03/09/2017  . 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 Genetic testing 02/06/2017  . Acute respiratory failure with hypoxia ([place]) 06/07/2016  . RSV (acute bronchiolitis due to respiratory syncytial virus) 06/07/2016  . Anxiety 06/07/2016  . Arthritis   . Depression 05/30/2016  . Chronic kidney disease (CKD), stage IV (severe) ([place]) 05/30/2016  . Mild intermittent asthma with acute exacerbation   . Respiratory distress   . CKD (chronic kidney disease) stage 3, GFR 30-59 ml/min (HCC)   . Chronic pain syndrome   . Bronchitis 05/29/2016  . Left shoulder pain   . Opacity of lung on imaging study   . Shoulder pain, acute   . Weakness generalized 12/22/2015  . Essential hypertension 12/22/2015  . Pneumonia 12/22/2015  . PVC's (premature ventricular contractions) 12/22/2015  . CAP (community acquired pneumonia) 12/21/2015   Kerin Perna, PTA 11/28/17 12:30 PM  [address] [place] [place], [place], [address] Phone: [phone]   Fax:  [phone]  Name: Cheryl Lane MRN: [id number] Date of Birth: Jun 27, 1950

## 2017-12-03 DIAGNOSIS — Z96651 Presence of right artificial knee joint: Secondary | ICD-10-CM | POA: Diagnosis not present

## 2017-12-03 DIAGNOSIS — Z471 Aftercare following joint replacement surgery: Secondary | ICD-10-CM | POA: Diagnosis not present

## 2017-12-03 DIAGNOSIS — M1711 Unilateral primary osteoarthritis, right knee: Secondary | ICD-10-CM | POA: Diagnosis not present

## 2017-12-03 MED FILL — HYDROCODON-APAP 5-325: 5-325 | 7 days supply | Qty: 30 | Fill #0

## 2017-12-04 ENCOUNTER — Ambulatory Visit: Payer: Medicare HMO | Admitting: Physical Therapy

## 2017-12-04 ENCOUNTER — Encounter: Payer: Self-pay | Admitting: Physical Therapy

## 2017-12-04 DIAGNOSIS — M25661 Stiffness of right knee, not elsewhere classified: Secondary | ICD-10-CM

## 2017-12-04 DIAGNOSIS — R600 Localized edema: Secondary | ICD-10-CM | POA: Diagnosis not present

## 2017-12-04 DIAGNOSIS — R262 Difficulty in walking, not elsewhere classified: Secondary | ICD-10-CM | POA: Diagnosis not present

## 2017-12-04 DIAGNOSIS — M25561 Pain in right knee: Secondary | ICD-10-CM | POA: Diagnosis not present

## 2017-12-04 NOTE — Therapy (Signed)
[place], [place], [address] Phone: [phone]   Fax:  [phone]  Physical Therapy Treatment  Patient Details  Name: Cheryl Lane MRN: [id number] Date of Birth: [birth year] Referring Provider: Dr. Delfino Lovett   Encounter Date: 12/04/2017  PT End of Session - 12/04/17 1644    Visit Number  5    Date for PT Re-Evaluation  01/21/18    PT Start Time  1605    PT Stop Time  1705    PT Time Calculation (min)  60 min    Activity Tolerance  Patient tolerated treatment well    Behavior During Therapy  [facility] for tasks assessed/performed       Past Medical History:  Diagnosis Date  . Acute respiratory failure with hypoxia ([place]) 06/07/2016   RESOLVED   . Anemia    as a child  . Anxiety 06/07/2016  . Arthritis   . Asthma    bronchitis  . CAP (community acquired pneumonia) 12/21/2015  . Cervical radiculopathy   . Chronic kidney disease (CKD), stage IV (severe) ([place]) 05/30/2016  . Chronic pain in left shoulder   . CKD (chronic kidney disease)    pt not aware of this  . CKD (chronic kidney disease) stage 3, GFR 30-59 ml/min (HCC)   . Depression   . Hypertension   . Hypothyroidism   . Pneumonia   . Pre-diabetes   . PVC's (premature ventricular contractions) 12/22/2015  . 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 APPENDECTOMY    . KNEE ARTHROPLASTY Right 11/15/2017   Procedure: RIGHT TOTAL KNEE ARTHROPLASTY WITH COMPUTER NAVIGATION;  Surgeon: Rod Can, MD;  [facility];  Service: Orthopedics;  Laterality: Right;  Needs RNFA  . REVERSE SHOULDER ARTHROPLASTY Left 03/01/2017   Procedure: REVERSE SHOULDER ARTHROPLASTY;  Surgeon: Justice Britain, MD;  Location: [facility];  Service: Orthopedics;  Laterality: Left;  . TUBAL LIGATION      There were no vitals filed for this visit.  Subjective Assessment - 12/04/17 1611     Subjective  Saw the MD, he was pleased, took bandage off reports that she is doing exercises at home    Currently in Pain?  Yes    Pain Score  5     Pain Location  Knee    Pain Orientation  Right    Pain Descriptors / Indicators  Sore                       [facility] Adult PT Treatment/Exercise - 12/04/17 0001      Ambulation/Gait   Gait Comments  practiced some gait with SPC and light hand hold assist, a lot of cues for correct hand, sequencing and step length      High Level Balance   High Level Balance Activities  Side stepping;Backward walking    High Level Balance Comments  then worked on gait for bigger steps and with HHA only, step through pattern      Exercises   Exercises  Knee/Hip      Knee/Hip Exercises: Aerobic   Nustep  L4: 6 min (arms and legs)      Knee/Hip Exercises: Machines for Strengthening   Cybex Knee Extension  5# 3x10    Cybex Knee Flexion  25# 3x10      Knee/Hip Exercises: Seated   Sit to Sand  20  reps;without UE support   worked with her holding ball needs cues     Modalities   Modalities  Vasopneumatic      Vasopneumatic   Number Minutes Vasopneumatic   10 minutes    Vasopnuematic Location   Knee    Vasopneumatic Pressure  Medium               PT Short Term Goals - 11/28/17 1229      PT SHORT TERM GOAL #1   Title  independent iwth initial HEP    Time  2    Period  Weeks    Status  Partially Met        PT Long Term Goals - 12/04/17 1646      PT LONG TERM GOAL #1   Title  decrease TUG time to 27 seconds    Status  On-going      PT LONG TERM GOAL #2   Title  increase AROM to 10-110 degrees flexion    Status  On-going      PT LONG TERM GOAL #3   Title  walk with SPC in the home    Status  On-going            Plan - 12/04/17 1644    Clinical Impression Statement  overall patient seems to be less anxious, she did much better with getting up from sitting, no rocking or multiple attempts, she was able to do  HHA with big steps some cues to do step through gait as she tends to do step to pattern.  Tried the cane she does not feel completely safe as she has a left shoulder problem    PT Next Visit Plan  work on gait and getting up from sitting.  She currently feeling better       Patient will benefit from skilled therapeutic intervention in order to improve the following deficits and impairments:  Abnormal gait, Decreased range of motion, Difficulty walking, Decreased endurance, Cardiopulmonary status limiting activity, Decreased activity tolerance, Pain, Impaired flexibility, Decreased scar mobility, Decreased balance, Decreased mobility, Decreased strength, Increased edema  Visit Diagnosis: Acute pain of right knee  Stiffness of right knee, not elsewhere classified  Localized edema  Difficulty in walking, not elsewhere classified     Problem List Patient Active Problem List   Diagnosis Date Noted  . Osteoarthritis of right knee 11/15/2017  . Humerus fracture 03/09/2017  . Asthma 03/09/2017  . Hypothyroidism 03/09/2017  . 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 Genetic testing 02/06/2017  . Acute respiratory failure with hypoxia ([place]) 06/07/2016  . RSV (acute bronchiolitis due to respiratory syncytial virus) 06/07/2016  . Anxiety 06/07/2016  . Arthritis   . Depression 05/30/2016  . Chronic kidney disease (CKD), stage IV (severe) ([place]) 05/30/2016  . Mild intermittent asthma with acute exacerbation   . Respiratory distress   . CKD (chronic kidney disease) stage 3, GFR 30-59 ml/min (HCC)   . Chronic pain syndrome   . Bronchitis 05/29/2016  . Left shoulder pain   . Opacity of lung on imaging study   . Shoulder pain, acute   . Weakness generalized 12/22/2015  . Essential hypertension 12/22/2015  . Pneumonia 12/22/2015  . PVC's (premature ventricular contractions) 12/22/2015  . CAP (community acquired pneumonia) 12/21/2015    Sumner Boast.,  PT 12/04/2017, 4:47 PM  [address] [place], [place], [address] Phone: [phone]   Fax:  [phone]  Name: Cheryl Lane MRN: [id number] Date of Birth: 1951-04-07

## 2017-12-06 ENCOUNTER — Ambulatory Visit: Payer: Medicare HMO | Admitting: Physical Therapy

## 2017-12-06 DIAGNOSIS — R600 Localized edema: Secondary | ICD-10-CM | POA: Diagnosis not present

## 2017-12-06 DIAGNOSIS — M25561 Pain in right knee: Secondary | ICD-10-CM

## 2017-12-06 DIAGNOSIS — M25661 Stiffness of right knee, not elsewhere classified: Secondary | ICD-10-CM

## 2017-12-06 DIAGNOSIS — R262 Difficulty in walking, not elsewhere classified: Secondary | ICD-10-CM

## 2017-12-06 NOTE — Therapy (Signed)
[place], [place], [address] Phone: [phone]   Fax:  [phone]  Physical Therapy Treatment  Patient Details  Name: Cheryl Lane MRN: [id number] Date of Birth: 01/10/1951 Referring Provider: Dr. Delfino Lovett   Encounter Date: 12/06/2017  PT End of Session - 12/06/17 1700    Visit Number  6    Date for PT Re-Evaluation  01/21/18    PT Start Time  1615    PT Stop Time  7353    PT Time Calculation (min)  60 min       Past Medical History:  Diagnosis Date  . Acute respiratory failure with hypoxia ([place]) 06/07/2016   RESOLVED   . Anemia    as a child  . Anxiety 06/07/2016  . Arthritis   . Asthma    bronchitis  . CAP (community acquired pneumonia) 12/21/2015  . Cervical radiculopathy   . Chronic kidney disease (CKD), stage IV (severe) ([place]) 05/30/2016  . Chronic pain in left shoulder   . CKD (chronic kidney disease)    pt not aware of this  . CKD (chronic kidney disease) stage 3, GFR 30-59 ml/min (HCC)   . Depression   . Hypertension   . Hypothyroidism   . Pneumonia   . Pre-diabetes   . PVC's (premature ventricular contractions) 12/22/2015  . 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 APPENDECTOMY    . KNEE ARTHROPLASTY Right 11/15/2017   Procedure: RIGHT TOTAL KNEE ARTHROPLASTY WITH COMPUTER NAVIGATION;  Surgeon: Rod Can, MD;  [facility];  Service: Orthopedics;  Laterality: Right;  Needs RNFA  . REVERSE SHOULDER ARTHROPLASTY Left 03/01/2017   Procedure: REVERSE SHOULDER ARTHROPLASTY;  Surgeon: Justice Britain, MD;  Location: [facility];  Service: Orthopedics;  Laterality: Left;  . TUBAL LIGATION      There were no vitals filed for this visit.  Subjective Assessment - 12/06/17 1625    Subjective  working hard on walking at home    Currently in Pain?  Yes    Pain Score  3     Pain Location  Knee    Pain Orientation  Right         [facility] PT Assessment - 12/06/17 0001      AROM   Right Knee Extension  0   seated EOM   Right Knee Flexion  97   seated EOM                  [facility] Adult PT Treatment/Exercise - 12/06/17 0001      Ambulation/Gait   Gait Comments  amb in from parking lot to clinic 200 feet SPC, CGA with occassional HHA d/t LOB. decreased heel strike on left d/t leg shorter   3 standing rest breaks d/t fatigue     Exercises   Exercises  Knee/Hip      Knee/Hip Exercises: Aerobic   Nustep  L4: 6 min (arms and legs)      Knee/Hip Exercises: Machines for Strengthening   Cybex Knee Extension  5# 3x10    Cybex Knee Flexion  25# 3x10      Knee/Hip Exercises: Seated   Ball Squeeze  15 times 3 sec    Clamshell with TheraBand  Green    Other Seated Knee/Hip Exercises  marching green tband 15 times each    Other Seated Knee/Hip Exercises  fitter 2 blue  flex/ext 2 sets 10   HOLD 3 sec     Modalities   Modalities  Vasopneumatic      Vasopneumatic   Number Minutes Vasopneumatic   15 minutes    Vasopnuematic Location   Knee    Vasopneumatic Pressure  Medium      Manual Therapy   Manual Therapy  Soft tissue mobilization    Soft tissue mobilization  gentle to incision and surrounding tissues- instructed ot do at home with Vit E               PT Short Term Goals - 11/28/17 1229      PT SHORT TERM GOAL #1   Title  independent iwth initial HEP    Time  2    Period  Weeks    Status  Partially Met        PT Long Term Goals - 12/04/17 1646      PT LONG TERM GOAL #1   Title  decrease TUG time to 27 seconds    Status  On-going      PT LONG TERM GOAL #2   Title  increase AROM to 10-110 degrees flexion    Status  On-going      PT LONG TERM GOAL #3   Title  walk with SPC in the home    Status  On-going            Plan - 12/06/17 1700    Clinical Impression Statement  pt much more confident with amb with SPC but does fatigue  quickly and gait deficiets on left as it needs replaced too. Pt amb into clinic as well as from machinine to machine with SPC or HHA, CGA. Excellent increase in ROM esp ext. Tolerated ther ex well. Progressing with goals.       Patient will benefit from skilled therapeutic intervention in order to improve the following deficits and impairments:  Abnormal gait, Decreased range of motion, Difficulty walking, Decreased endurance, Cardiopulmonary status limiting activity, Decreased activity tolerance, Pain, Impaired flexibility, Decreased scar mobility, Decreased balance, Decreased mobility, Decreased strength, Increased edema  Visit Diagnosis: Acute pain of right knee  Stiffness of right knee, not elsewhere classified  Localized edema  Difficulty in walking, not elsewhere classified     Problem List Patient Active Problem List   Diagnosis Date Noted  . Osteoarthritis of right knee 11/15/2017  . Humerus fracture 03/09/2017  . Asthma 03/09/2017  . Hypothyroidism 03/09/2017  . 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 Genetic testing 02/06/2017  . Acute respiratory failure with hypoxia ([place]) 06/07/2016  . RSV (acute bronchiolitis due to respiratory syncytial virus) 06/07/2016  . Anxiety 06/07/2016  . Arthritis   . Depression 05/30/2016  . Chronic kidney disease (CKD), stage IV (severe) ([place]) 05/30/2016  . Mild intermittent asthma with acute exacerbation   . Respiratory distress   . CKD (chronic kidney disease) stage 3, GFR 30-59 ml/min (HCC)   . Chronic pain syndrome   . Bronchitis 05/29/2016  . Left shoulder pain   . Opacity of lung on imaging study   . Shoulder pain, acute   . Weakness generalized 12/22/2015  . Essential hypertension 12/22/2015  . Pneumonia 12/22/2015  . PVC's (premature ventricular contractions) 12/22/2015  . CAP (community acquired pneumonia) 12/21/2015    PAYSEUR,ANGIE PTA 12/06/2017, 5:02 PM  [place], [place], [address] Phone: [phone]   Fax:  [phone]  Name: Cheryl Lane MRN: [id number] Date of Birth: Dec 27, 1950

## 2017-12-10 ENCOUNTER — Ambulatory Visit: Payer: Medicare HMO | Admitting: Physical Therapy

## 2017-12-10 ENCOUNTER — Encounter: Payer: Self-pay | Admitting: Physical Therapy

## 2017-12-10 DIAGNOSIS — M25561 Pain in right knee: Secondary | ICD-10-CM

## 2017-12-10 DIAGNOSIS — M25661 Stiffness of right knee, not elsewhere classified: Secondary | ICD-10-CM

## 2017-12-10 DIAGNOSIS — R600 Localized edema: Secondary | ICD-10-CM | POA: Diagnosis not present

## 2017-12-10 DIAGNOSIS — R262 Difficulty in walking, not elsewhere classified: Secondary | ICD-10-CM | POA: Diagnosis not present

## 2017-12-10 NOTE — Therapy (Signed)
[place], [place], [address] Phone: [phone]   Fax:  [phone]  Physical Therapy Treatment  Patient Details  Name: Cheryl Lane MRN: [id number] Date of Birth: 10-03-1950 Referring Provider: Dr. Delfino Lovett   Encounter Date: 12/10/2017  PT End of Session - 12/10/17 1656    Visit Number  7    Date for PT Re-Evaluation  01/21/18    PT Start Time  1608    PT Stop Time  1708    PT Time Calculation (min)  60 min    Activity Tolerance  Patient tolerated treatment well    Behavior During Therapy  [facility] for tasks assessed/performed       Past Medical History:  Diagnosis Date  . Acute respiratory failure with hypoxia ([place]) 06/07/2016   RESOLVED   . Anemia    as a child  . Anxiety 06/07/2016  . Arthritis   . Asthma    bronchitis  . CAP (community acquired pneumonia) 12/21/2015  . Cervical radiculopathy   . Chronic kidney disease (CKD), stage IV (severe) ([place]) 05/30/2016  . Chronic pain in left shoulder   . CKD (chronic kidney disease)    pt not aware of this  . CKD (chronic kidney disease) stage 3, GFR 30-59 ml/min (HCC)   . Depression   . Hypertension   . Hypothyroidism   . Pneumonia   . Pre-diabetes   . PVC's (premature ventricular contractions) 12/22/2015  . 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 APPENDECTOMY    . KNEE ARTHROPLASTY Right 11/15/2017   Procedure: RIGHT TOTAL KNEE ARTHROPLASTY WITH COMPUTER NAVIGATION;  Surgeon: Rod Can, MD;  [facility];  Service: Orthopedics;  Laterality: Right;  Needs RNFA  . REVERSE SHOULDER ARTHROPLASTY Left 03/01/2017   Procedure: REVERSE SHOULDER ARTHROPLASTY;  Surgeon: Justice Britain, MD;  Location: [facility];  Service: Orthopedics;  Laterality: Left;  . TUBAL LIGATION      There were no vitals filed for this visit.  Subjective Assessment - 12/10/17 1615    Subjective  Patient reports that she was very pleased that she was able to walk in to our suite last week    Currently in Pain?  Yes    Pain Score  3     Pain Location  Knee    Pain Orientation  Right    Pain Descriptors / Indicators  Sore         [facility] PT Assessment - 12/10/17 0001      PROM   Right Knee Flexion  105                   [facility] Adult PT Treatment/Exercise - 12/10/17 0001      Ambulation/Gait   Gait Comments  ambulate in from car with SPC and close supervision, one rest break, some cues for step length, practice on the stairs one at a time 2 steps, the non operative knee seems to be the most limiting due to bone on bone crepitus      Exercises   Exercises  Knee/Hip      Knee/Hip Exercises: Aerobic   Recumbent Bike  4 minutes partial revolutions, at the midpoint was able to go around with pretty significant trunk lean, seat position #7    Nustep  L4: 6 min (arms and legs)      Knee/Hip Exercises: Machines for Strengthening  Cybex Knee Extension  5# 3x10    Cybex Knee Flexion  25# 3x10      Modalities   Modalities  Vasopneumatic      Vasopneumatic   Number Minutes Vasopneumatic   10 minutes    Vasopnuematic Location   Knee    Vasopneumatic Pressure  Medium    Vasopneumatic Temperature   34      Manual Therapy   Manual Therapy  Passive ROM    Soft tissue mobilization  gentle to incision and surrounding tissues- instructed ot do at home with Vit E    Passive ROM  passive ROM to the right knee flexion               PT Short Term Goals - 11/28/17 1229      PT SHORT TERM GOAL #1   Title  independent iwth initial HEP    Time  2    Period  Weeks    Status  Partially Met        PT Long Term Goals - 12/10/17 1809      PT LONG TERM GOAL #2   Title  increase AROM to 10-110 degrees flexion    Status  Partially Met      PT LONG TERM GOAL #3   Title  walk with SPC in the home    Status  Partially Met            Plan -  12/10/17 1808    Clinical Impression Statement  Patient doing very well increased ROM, increased ability to walk with the [facility] and less rest breaks, she still has some anxiety and wants you to be close by "just incase"  She does have significant swelling still in the LE that is sore with PROM, the scar is puckering ans she tolerated the STM well    PT Next Visit Plan  continue to work on ROM, strength and function    Consulted and Agree with Plan of Care  Patient       Patient will benefit from skilled therapeutic intervention in order to improve the following deficits and impairments:  Abnormal gait, Decreased range of motion, Difficulty walking, Decreased endurance, Cardiopulmonary status limiting activity, Decreased activity tolerance, Pain, Impaired flexibility, Decreased scar mobility, Decreased balance, Decreased mobility, Decreased strength, Increased edema  Visit Diagnosis: Acute pain of right knee  Stiffness of right knee, not elsewhere classified  Localized edema  Difficulty in walking, not elsewhere classified     Problem List Patient Active Problem List   Diagnosis Date Noted  . Osteoarthritis of right knee 11/15/2017  . Humerus fracture 03/09/2017  . Asthma 03/09/2017  . Hypothyroidism 03/09/2017  . 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 Genetic testing 02/06/2017  . Acute respiratory failure with hypoxia ([place]) 06/07/2016  . RSV (acute bronchiolitis due to respiratory syncytial virus) 06/07/2016  . Anxiety 06/07/2016  . Arthritis   . Depression 05/30/2016  . Chronic kidney disease (CKD), stage IV (severe) ([place]) 05/30/2016  . Mild intermittent asthma with acute exacerbation   . Respiratory distress   . CKD (chronic kidney disease) stage 3, GFR 30-59 ml/min (HCC)   . Chronic pain syndrome   . Bronchitis 05/29/2016  . Left shoulder pain   . Opacity of lung on imaging study   . Shoulder pain, acute   . Weakness generalized 12/22/2015  .  Essential hypertension 12/22/2015  . Pneumonia 12/22/2015  . PVC's (premature ventricular contractions) 12/22/2015  .  CAP (community acquired pneumonia) 12/21/2015    Sumner Boast., PT 12/10/2017, 6:10 PM  [address] [place], [place], [address] Phone: [phone]   Fax:  [phone]  Name: Cheryl Lane MRN: [id number] Date of Birth: 03/14/1951

## 2017-12-12 ENCOUNTER — Ambulatory Visit: Payer: Medicare HMO | Admitting: Physical Therapy

## 2017-12-13 ENCOUNTER — Ambulatory Visit: Payer: Medicare HMO | Admitting: Physical Therapy

## 2017-12-14 ENCOUNTER — Ambulatory Visit: Payer: Medicare HMO | Admitting: Physical Therapy

## 2017-12-18 ENCOUNTER — Encounter: Payer: Self-pay | Admitting: Physical Therapy

## 2017-12-18 ENCOUNTER — Ambulatory Visit: Payer: Medicare HMO | Admitting: Physical Therapy

## 2017-12-18 DIAGNOSIS — R262 Difficulty in walking, not elsewhere classified: Secondary | ICD-10-CM

## 2017-12-18 DIAGNOSIS — R600 Localized edema: Secondary | ICD-10-CM

## 2017-12-18 DIAGNOSIS — M25561 Pain in right knee: Secondary | ICD-10-CM

## 2017-12-18 DIAGNOSIS — M25661 Stiffness of right knee, not elsewhere classified: Secondary | ICD-10-CM

## 2017-12-18 NOTE — Therapy (Signed)
[place], [place], [address] Phone: [phone]   Fax:  [phone]  Physical Therapy Treatment  Patient Details  Name: Cheryl Lane MRN: [id number] Date of Birth: 02/24/1951 Referring Provider: Dr. Delfino Lovett   Encounter Date: 12/18/2017  PT End of Session - 12/18/17 1106    Visit Number  8    Date for PT Re-Evaluation  01/21/18    PT Start Time  1011    PT Stop Time  1115    PT Time Calculation (min)  64 min    Activity Tolerance  Patient tolerated treatment well    Behavior During Therapy  [facility] for tasks assessed/performed       Past Medical History:  Diagnosis Date  . Acute respiratory failure with hypoxia ([place]) 06/07/2016   RESOLVED   . Anemia    as a child  . Anxiety 06/07/2016  . Arthritis   . Asthma    bronchitis  . CAP (community acquired pneumonia) 12/21/2015  . Cervical radiculopathy   . Chronic kidney disease (CKD), stage IV (severe) ([place]) 05/30/2016  . Chronic pain in left shoulder   . CKD (chronic kidney disease)    pt not aware of this  . CKD (chronic kidney disease) stage 3, GFR 30-59 ml/min (HCC)   . Depression   . Hypertension   . Hypothyroidism   . Pneumonia   . Pre-diabetes   . PVC's (premature ventricular contractions) 12/22/2015  . 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 APPENDECTOMY    . KNEE ARTHROPLASTY Right 11/15/2017   Procedure: RIGHT TOTAL KNEE ARTHROPLASTY WITH COMPUTER NAVIGATION;  Surgeon: Rod Can, MD;  [facility];  Service: Orthopedics;  Laterality: Right;  Needs RNFA  . REVERSE SHOULDER ARTHROPLASTY Left 03/01/2017   Procedure: REVERSE SHOULDER ARTHROPLASTY;  Surgeon: Justice Britain, MD;  Location: [facility];  Service: Orthopedics;  Laterality: Left;  . TUBAL LIGATION      There were no vitals filed for this visit.  Subjective Assessment - 12/18/17 1014    Subjective  "I think it is doing better" Tenderness reported in her distal R shin    Pain Location  Knee    Pain Orientation  Right                       [facility] Adult PT Treatment/Exercise - 12/18/17 0001      Ambulation/Gait   Gait Comments  ambulate in from car with RW and supervision, no rest break, some cues for step length,       Exercises   Exercises  Knee/Hip      Knee/Hip Exercises: Aerobic   Recumbent Bike  5 min seat 6 full revlutions    Nustep  L4: 6 min (arms and legs)      Knee/Hip Exercises: Standing   Other Standing Knee Exercises  RLE TKE with RW green band 2x15       Knee/Hip Exercises: Seated   Long Arc Quad  Right;10 reps;2 sets;Weights    Long Arc Quad Weight  2 lbs.    Other Seated Knee/Hip Exercises  fitter press 2 blue 1 black 2x15     Hamstring Curl  Right;2 sets;15 reps    Hamstring Limitations  green tband     Sit to Sand  2 sets;10 reps;without UE support   airex on mat table  Modalities   Modalities  Vasopneumatic      Vasopneumatic   Number Minutes Vasopneumatic   15 minutes    Vasopnuematic Location   Knee    Vasopneumatic Pressure  Medium    Vasopneumatic Temperature   34      Manual Therapy   Manual Therapy  Passive ROM    Soft tissue mobilization  gentle to incision and surrounding tissues- instructed ot do at home with Vit E    Passive ROM  passive ROM to the right knee flexion               PT Short Term Goals - 11/28/17 1229      PT SHORT TERM GOAL #1   Title  independent iwth initial HEP    Time  2    Period  Weeks    Status  Partially Met        PT Long Term Goals - 12/10/17 1809      PT LONG TERM GOAL #2   Title  increase AROM to 10-110 degrees flexion    Status  Partially Met      PT LONG TERM GOAL #3   Title  walk with SPC in the home    Status  Partially Met            Plan - 12/18/17 1108    Clinical Impression Statement  Pt continues to do well in therapy, PROM is good noted  with PROM. Good quad contraction with quad sets with manuel resiance. Ambulated in clinic with RW S assist. Cues to hold contraction with HS curls and LAQ.    Rehab Potential  Good    PT Frequency  3x / week    PT Duration  8 weeks    PT Treatment/Interventions  ADLs/[facility];Cryotherapy;Gait training;Stair training;Functional mobility training;Therapeutic activities;Therapeutic exercise;Balance training;Neuromuscular re-education;Patient/family education;Vasopneumatic Device    PT Next Visit Plan  continue to work on ROM, strength and function       Patient will benefit from skilled therapeutic intervention in order to improve the following deficits and impairments:  Abnormal gait, Decreased range of motion, Difficulty walking, Decreased endurance, Cardiopulmonary status limiting activity, Decreased activity tolerance, Pain, Impaired flexibility, Decreased scar mobility, Decreased balance, Decreased mobility, Decreased strength, Increased edema  Visit Diagnosis: Stiffness of right knee, not elsewhere classified  Acute pain of right knee  Localized edema  Difficulty in walking, not elsewhere classified     Problem List Patient Active Problem List   Diagnosis Date Noted  . Osteoarthritis of right knee 11/15/2017  . Humerus fracture 03/09/2017  . Asthma 03/09/2017  . Hypothyroidism 03/09/2017  . 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 Genetic testing 02/06/2017  . Acute respiratory failure with hypoxia ([place]) 06/07/2016  . RSV (acute bronchiolitis due to respiratory syncytial virus) 06/07/2016  . Anxiety 06/07/2016  . Arthritis   . Depression 05/30/2016  . Chronic kidney disease (CKD), stage IV (severe) ([place]) 05/30/2016  . Mild intermittent asthma with acute exacerbation   . Respiratory distress   . CKD (chronic kidney disease) stage 3, GFR 30-59 ml/min (HCC)   . Chronic pain syndrome   . Bronchitis 05/29/2016  . Left shoulder pain   .  Opacity of lung on imaging study   . Shoulder pain, acute   . Weakness generalized 12/22/2015  . Essential hypertension 12/22/2015  . Pneumonia 12/22/2015  . PVC's (premature ventricular contractions) 12/22/2015  . CAP (community acquired pneumonia) 12/21/2015  Scot Jun, PTA 12/18/2017, 11:09 AM  [address] [place], [place], [address] Phone: [phone]   Fax:  [phone]  Name: Cheryl Lane MRN: [id number] Date of Birth: 1950-12-25

## 2017-12-20 ENCOUNTER — Ambulatory Visit: Payer: Medicare HMO | Admitting: Physical Therapy

## 2017-12-20 ENCOUNTER — Encounter: Payer: Self-pay | Admitting: Physical Therapy

## 2017-12-20 DIAGNOSIS — M25661 Stiffness of right knee, not elsewhere classified: Secondary | ICD-10-CM

## 2017-12-20 DIAGNOSIS — R262 Difficulty in walking, not elsewhere classified: Secondary | ICD-10-CM

## 2017-12-20 DIAGNOSIS — M25561 Pain in right knee: Secondary | ICD-10-CM

## 2017-12-20 DIAGNOSIS — R600 Localized edema: Secondary | ICD-10-CM

## 2017-12-20 NOTE — Therapy (Signed)
[place] [address] [place], [place], [address] Phone: [phone]   Fax:  [phone]  Physical Therapy Treatment  Patient Details  Name: Cheryl Lane MRN: [id number] Date of Birth: 08/21/1950 Referring Provider: Dr. Delfino Lovett   Encounter Date: 12/20/2017  PT End of Session - 12/20/17 1132    Visit Number  9    Date for PT Re-Evaluation  01/21/18    PT Start Time  1000    PT Stop Time  1105    PT Time Calculation (min)  65 min    Activity Tolerance  Patient tolerated treatment well    Behavior During Therapy  [facility] for tasks assessed/performed       Past Medical History:  Diagnosis Date  . Acute respiratory failure with hypoxia ([place]) 06/07/2016   RESOLVED   . Anemia    as a child  . Anxiety 06/07/2016  . Arthritis   . Asthma    bronchitis  . CAP (community acquired pneumonia) 12/21/2015  . Cervical radiculopathy   . Chronic kidney disease (CKD), stage IV (severe) ([place]) 05/30/2016  . Chronic pain in left shoulder   . CKD (chronic kidney disease)    pt not aware of this  . CKD (chronic kidney disease) stage 3, GFR 30-59 ml/min (HCC)   . Depression   . Hypertension   . Hypothyroidism   . Pneumonia   . Pre-diabetes   . PVC's (premature ventricular contractions) 12/22/2015  . 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 APPENDECTOMY    . KNEE ARTHROPLASTY Right 11/15/2017   Procedure: RIGHT TOTAL KNEE ARTHROPLASTY WITH COMPUTER NAVIGATION;  Surgeon: Rod Can, MD;  Location: [facility];  Service: Orthopedics;  Laterality: Right;  Needs RNFA  . REVERSE SHOULDER ARTHROPLASTY Left 03/01/2017   Procedure: REVERSE SHOULDER ARTHROPLASTY;  Surgeon: Justice Britain, MD;  Location: [place];  Service: Orthopedics;  Laterality: Left;  . TUBAL LIGATION      There were no vitals filed for this visit.  Subjective Assessment - 12/20/17 1012    Subjective  Patient reports that she was very sore, feels like it was the bike    Currently in Pain?  Yes    Pain Score  4     Pain Location  Knee    Pain Orientation  Right    Aggravating Factors   bike         [facility] PT Assessment - 12/20/17 0001      AROM   Right Knee Flexion  100      PROM   Right Knee Flexion  110                   [facility] Adult PT Treatment/Exercise - 12/20/17 0001      Ambulation/Gait   Gait Comments  ambulate with SPC and SBA 150'x 3, needs reassurance      High Level Balance   High Level Balance Activities  Side stepping;Backward walking      Knee/Hip Exercises: Aerobic   Recumbent Bike  5 min seat 6 full revlutions    Nustep  L4: 6 min (arms and legs)      Knee/Hip Exercises: Machines for Strengthening   Cybex Knee Extension  5# 3x10    Cybex Knee Flexion  25# 3x10      Knee/Hip Exercises: Standing   Other Standing Knee Exercises  3# hip flexion,  extension and abduction bilateral 2x10 each      Vasopneumatic   Number Minutes Vasopneumatic   10 minutes    Vasopnuematic Location   Knee    Vasopneumatic Pressure  Medium    Vasopneumatic Temperature   34      Manual Therapy   Manual Therapy  Passive ROM    Soft tissue mobilization  gentle to incision and surrounding tissues- instructed ot do at home with Vit E    Passive ROM  passive ROM to the right knee flexion               PT Short Term Goals - 11/28/17 1229      PT SHORT TERM GOAL #1   Title  independent iwth initial HEP    Time  2    Period  Weeks    Status  Partially Met        PT Long Term Goals - 12/20/17 1135      PT LONG TERM GOAL #2   Title  increase AROM to 10-110 degrees flexion    Status  Partially Met      PT LONG TERM GOAL #3   Title  walk with SPC in the home    Status  Partially Met      PT LONG TERM GOAL #4   Title  be able to go up and down stairs with SBA, one at a time    Status  Partially Met      PT LONG TERM GOAL #5   Title   decrease swelling 3 cm    Status  Partially Met            Plan - 12/20/17 1133    Clinical Impression Statement  Patient able to do full revolutions on bike with pain after a few attempts, she is moving with more confidence however she does not want PT to be away from her and her first few steps she tends to want to hold onto PT of furniture for balance.  She does fatigue with walking and being on her feet c/o back fatigue.  ROM is improving    PT Next Visit Plan  work on gait with SPC and her confidence, she is afraid of the steps    Consulted and Agree with Plan of Care  Patient       Patient will benefit from skilled therapeutic intervention in order to improve the following deficits and impairments:  Abnormal gait, Decreased range of motion, Difficulty walking, Decreased endurance, Cardiopulmonary status limiting activity, Decreased activity tolerance, Pain, Impaired flexibility, Decreased scar mobility, Decreased balance, Decreased mobility, Decreased strength, Increased edema  Visit Diagnosis: Stiffness of right knee, not elsewhere classified  Acute pain of right knee  Localized edema  Difficulty in walking, not elsewhere classified     Problem List Patient Active Problem List   Diagnosis Date Noted  . Osteoarthritis of right knee 11/15/2017  . Humerus fracture 03/09/2017  . Asthma 03/09/2017  . Hypothyroidism 03/09/2017  . 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 Genetic testing 02/06/2017  . Acute respiratory failure with hypoxia ([place]) 06/07/2016  . RSV (acute bronchiolitis due to respiratory syncytial virus) 06/07/2016  . Anxiety 06/07/2016  . Arthritis   . Depression 05/30/2016  . Chronic kidney disease (CKD), stage IV (severe) ([place]) 05/30/2016  . Mild intermittent asthma with acute exacerbation   . Respiratory distress   . CKD (chronic kidney disease) stage 3, GFR 30-59 ml/min (HCC)   .  Chronic pain syndrome   . Bronchitis  05/29/2016  . Left shoulder pain   . Opacity of lung on imaging study   . Shoulder pain, acute   . Weakness generalized 12/22/2015  . Essential hypertension 12/22/2015  . Pneumonia 12/22/2015  . PVC's (premature ventricular contractions) 12/22/2015  . CAP (community acquired pneumonia) 12/21/2015    Sumner Boast., PT 12/20/2017, 11:36 AM  [address] [place], [place], [address] Phone: [phone]   Fax:  [phone]  Name: Cheryl Lane MRN: [id number] Date of Birth: 12-18-1950

## 2017-12-25 ENCOUNTER — Ambulatory Visit: Payer: Medicare HMO | Attending: Orthopedic Surgery | Admitting: Physical Therapy

## 2017-12-25 ENCOUNTER — Encounter: Payer: Self-pay | Admitting: Physical Therapy

## 2017-12-25 DIAGNOSIS — M25561 Pain in right knee: Secondary | ICD-10-CM

## 2017-12-25 DIAGNOSIS — R262 Difficulty in walking, not elsewhere classified: Secondary | ICD-10-CM | POA: Insufficient documentation

## 2017-12-25 DIAGNOSIS — M25661 Stiffness of right knee, not elsewhere classified: Secondary | ICD-10-CM | POA: Diagnosis not present

## 2017-12-25 DIAGNOSIS — R600 Localized edema: Secondary | ICD-10-CM | POA: Insufficient documentation

## 2017-12-25 NOTE — Therapy (Signed)
[address] [place], [place], [address] Phone: [phone]   Fax:  [phone] Progress Note Reporting Period 11/20/17 to 12/25/17 for the first 10 visits  See note below for Objective Data and Assessment of Progress/Goals.      Physical Therapy Treatment  Patient Details  Name: Cheryl Lane MRN: [id number] Date of Birth: [birth year] Referring Provider: Dr. Delfino Lovett   Encounter Date: 12/25/2017  PT End of Session - 12/25/17 1135    Visit Number  10    Date for PT Re-Evaluation  01/21/18    PT Start Time  1050    PT Stop Time  1150    PT Time Calculation (min)  60 min       Past Medical History:  Diagnosis Date  . Acute respiratory failure with hypoxia ([place]) 06/07/2016   RESOLVED   . Anemia    as a child  . Anxiety 06/07/2016  . Arthritis   . Asthma    bronchitis  . CAP (community acquired pneumonia) 12/21/2015  . Cervical radiculopathy   . Chronic kidney disease (CKD), stage IV (severe) ([place]) 05/30/2016  . Chronic pain in left shoulder   . CKD (chronic kidney disease)    pt not aware of this  . CKD (chronic kidney disease) stage 3, GFR 30-59 ml/min (HCC)   . Depression   . Hypertension   . Hypothyroidism   . Pneumonia   . Pre-diabetes   . PVC's (premature ventricular contractions) 12/22/2015  . 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 APPENDECTOMY    . KNEE ARTHROPLASTY Right 11/15/2017   Procedure: RIGHT TOTAL KNEE ARTHROPLASTY WITH COMPUTER NAVIGATION;  Surgeon: Rod Can, MD;  [facility];  Service: Orthopedics;  Laterality: Right;  Needs RNFA  . REVERSE SHOULDER ARTHROPLASTY Left 03/01/2017   Procedure: REVERSE SHOULDER ARTHROPLASTY;  Surgeon: Justice Britain, MD;  Location: [place];  Service: Orthopedics;  Laterality: Left;  . TUBAL LIGATION      There were no vitals filed for this  visit.  Subjective Assessment - 12/25/17 1100    Subjective  lost all my energy again Saturday. left kne hurts more than Rt    Currently in Pain?  Yes    Pain Score  2     Pain Location  Knee         [facility] PT Assessment - 12/25/17 0001      AROM   AROM Assessment Site  Knee    Right/Left Knee  Right    Right Knee Extension  1    Right Knee Flexion  105      Strength   Strength Assessment Site  Knee    Right/Left Knee  Right    Right Knee Flexion  4+/5    Right Knee Extension  4+/5                   [facility] Adult PT Treatment/Exercise - 12/25/17 0001      Ambulation/Gait   Gait Comments  amb into clinic 200 feet with SPC mod I without rest - uses cane in RT hand ( on surgical side) d/t left knee so bad      Exercises   Exercises  Knee/Hip      Knee/Hip Exercises: Aerobic   Nustep  L 4 6 min - LE only      Knee/Hip Exercises: Machines for  Strengthening   Cybex Knee Extension  5# RT only 2 sets 10, BIl 10# 10 times    Cybex Knee Flexion  25# 2x10, 15# 10 RT only      Knee/Hip Exercises: Standing   Lateral Step Up  3 sets;10 reps;Hand Hold: 2;Step Height: 4";Left    Forward Step Up  Left;3 sets;5 reps;Hand Hold: 2;Step Height: 4"      Knee/Hip Exercises: Seated   Long Arc Quad  Strengthening;Both;2 sets;15 reps   green tband   Ball Squeeze  15 times 3 sec    Clamshell with TheraBand  Green   25   Other Seated Knee/Hip Exercises  marching green tband 20 each    Hamstring Curl  Both;2 sets;15 reps   green tband   Sit to Sand  2 sets;10 reps;without UE support   from mat with wt ball     Vasopneumatic   Number Minutes Vasopneumatic   15 minutes    Vasopnuematic Location   Knee    Vasopneumatic Pressure  Medium    Vasopneumatic Temperature   34      Manual Therapy   Manual Therapy  Passive ROM    Passive ROM  passive ROM to the right knee flexion               PT Short Term Goals - 11/28/17 1229      PT SHORT TERM GOAL #1   Title   independent iwth initial HEP    Time  2    Period  Weeks    Status  Partially Met        PT Long Term Goals - 12/25/17 1135      PT LONG TERM GOAL #1   Title  decrease TUG time to 27 seconds    Baseline  28 sec with SPC slow to initiate 1st steps    Status  Partially Met      PT LONG TERM GOAL #2   Title  increase AROM to 10-110 degrees flexion    Baseline  1-105    Status  Partially Met      PT LONG TERM GOAL #3   Title  walk with SPC in the home    Status  Partially Met      PT LONG TERM GOAL #4   Title  be able to go up and down stairs with SBA, one at a time    Status  Partially Met      PT LONG TERM GOAL #5   Title  decrease swelling 3 cm    Status  Achieved            Plan - 12/25/17 1136    Clinical Impression Statement  progressing with goals,ROM and  strength increasing. working to increase gait distance and confidence but is slow d/t pain and weakness in Left LE.    PT Treatment/Interventions  ADLs/Self Care Home Management;Cryotherapy;Gait training;Stair training;Functional mobility training;Therapeutic activities;Therapeutic exercise;Balance training;Neuromuscular re-education;Patient/family education;Vasopneumatic Device    PT Next Visit Plan  work on gait with SPC and her confidence, she is afraid of the steps. MD next week and wants to discuss replacing left as she feels it is slowing progress and is hurting more than Rt knee    Recommended Other Services          Patient will benefit from skilled therapeutic intervention in order to improve the following deficits and impairments:  Abnormal gait, Decreased range of motion, Difficulty walking, Decreased endurance, Cardiopulmonary  status limiting activity, Decreased activity tolerance, Pain, Impaired flexibility, Decreased scar mobility, Decreased balance, Decreased mobility, Decreased strength, Increased edema  Visit Diagnosis: Stiffness of right knee, not elsewhere classified  Acute pain of right  knee  Localized edema  Difficulty in walking, not elsewhere classified     Problem List Patient Active Problem List   Diagnosis Date Noted  . Osteoarthritis of right knee 11/15/2017  . Humerus fracture 03/09/2017  . Asthma 03/09/2017  . Hypothyroidism 03/09/2017  . 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 Genetic testing 02/06/2017  . Acute respiratory failure with hypoxia ([place]) 06/07/2016  . RSV (acute bronchiolitis due to respiratory syncytial virus) 06/07/2016  . Anxiety 06/07/2016  . Arthritis   . Depression 05/30/2016  . Chronic kidney disease (CKD), stage IV (severe) ([place]) 05/30/2016  . Mild intermittent asthma with acute exacerbation   . Respiratory distress   . CKD (chronic kidney disease) stage 3, GFR 30-59 ml/min (HCC)   . Chronic pain syndrome   . Bronchitis 05/29/2016  . Left shoulder pain   . Opacity of lung on imaging study   . Shoulder pain, acute   . Weakness generalized 12/22/2015  . Essential hypertension 12/22/2015  . Pneumonia 12/22/2015  . PVC's (premature ventricular contractions) 12/22/2015  . CAP (community acquired pneumonia) 12/21/2015    Camey Edell,ANGIE PTA 12/25/2017, 11:39 AM  [place] [place], [place], [address] Phone: [phone]   Fax:  [phone]  Name: Cheryl Lane MRN: [id number] Date of Birth: March 29, 1951

## 2017-12-26 ENCOUNTER — Encounter: Payer: Self-pay | Admitting: Physical Therapy

## 2017-12-26 ENCOUNTER — Ambulatory Visit: Payer: Medicare HMO | Admitting: Physical Therapy

## 2017-12-26 DIAGNOSIS — S42292D Other displaced fracture of upper end of left humerus, subsequent encounter for fracture with routine healing: Secondary | ICD-10-CM | POA: Diagnosis not present

## 2017-12-26 DIAGNOSIS — R600 Localized edema: Secondary | ICD-10-CM | POA: Diagnosis not present

## 2017-12-26 DIAGNOSIS — N184 Chronic kidney disease, stage 4 (severe): Secondary | ICD-10-CM | POA: Diagnosis not present

## 2017-12-26 DIAGNOSIS — M170 Bilateral primary osteoarthritis of knee: Secondary | ICD-10-CM | POA: Diagnosis not present

## 2017-12-26 DIAGNOSIS — M25561 Pain in right knee: Secondary | ICD-10-CM | POA: Diagnosis not present

## 2017-12-26 DIAGNOSIS — M5412 Radiculopathy, cervical region: Secondary | ICD-10-CM | POA: Diagnosis not present

## 2017-12-26 DIAGNOSIS — R262 Difficulty in walking, not elsewhere classified: Secondary | ICD-10-CM | POA: Diagnosis not present

## 2017-12-26 DIAGNOSIS — M25661 Stiffness of right knee, not elsewhere classified: Secondary | ICD-10-CM | POA: Diagnosis not present

## 2017-12-26 DIAGNOSIS — Z96612 Presence of left artificial shoulder joint: Secondary | ICD-10-CM | POA: Diagnosis not present

## 2017-12-26 DIAGNOSIS — M5136 Other intervertebral disc degeneration, lumbar region: Secondary | ICD-10-CM | POA: Diagnosis not present

## 2017-12-26 DIAGNOSIS — M9983 Other biomechanical lesions of lumbar region: Secondary | ICD-10-CM | POA: Diagnosis not present

## 2017-12-26 DIAGNOSIS — M1990 Unspecified osteoarthritis, unspecified site: Secondary | ICD-10-CM | POA: Diagnosis not present

## 2017-12-26 NOTE — Therapy (Addendum)
[address], [place], [address] Phone: [phone]   Fax:  [phone]  Physical Therapy Treatment  Patient Details  Name: Cheryl Lane MRN: [id number] Date of Birth: 04/25/1950 Referring Provider: Dr. Delfino Lovett   Encounter Date: 12/26/2017  PT End of Session - 12/26/17 1524    Visit Number  11    Date for PT Re-Evaluation  01/21/18    PT Start Time  1443    PT Stop Time  1540    PT Time Calculation (min)  57 min    Activity Tolerance  Patient tolerated treatment well    Behavior During Therapy  [facility] for tasks assessed/performed;Anxious       Past Medical History:  Diagnosis Date  . Acute respiratory failure with hypoxia ([place]) 06/07/2016   RESOLVED   . Anemia    as a child  . Anxiety 06/07/2016  . Arthritis   . Asthma    bronchitis  . CAP (community acquired pneumonia) 12/21/2015  . Cervical radiculopathy   . Chronic kidney disease (CKD), stage IV (severe) ([place]) 05/30/2016  . Chronic pain in left shoulder   . CKD (chronic kidney disease)    pt not aware of this  . CKD (chronic kidney disease) stage 3, GFR 30-59 ml/min (HCC)   . Depression   . Hypertension   . Hypothyroidism   . Pneumonia   . Pre-diabetes   . PVC's (premature ventricular contractions) 12/22/2015  . 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 APPENDECTOMY    . KNEE ARTHROPLASTY Right 11/15/2017   Procedure: RIGHT TOTAL KNEE ARTHROPLASTY WITH COMPUTER NAVIGATION;  Surgeon: Rod Can, MD;  [facility];  Service: Orthopedics;  Laterality: Right;  Needs RNFA  . REVERSE SHOULDER ARTHROPLASTY Left 03/01/2017   Procedure: REVERSE SHOULDER ARTHROPLASTY;  Surgeon: Justice Britain, MD;  Location: [place];  Service: Orthopedics;  Laterality: Left;  . TUBAL LIGATION      There were no vitals filed for this visit.  Subjective Assessment - 12/26/17  1451    Subjective  Patient reports she is feeling a little better today.  Sees MD Monday    Currently in Pain?  Yes    Pain Score  2     Pain Location  Knee    Pain Orientation  Right    Pain Descriptors / Indicators  Aching;Tightness;Sore    Aggravating Factors   on the bike, bending the knee         [facility] PT Assessment - 12/26/17 0001      AROM   AROM Assessment Site  Knee    Right/Left Knee  Right    Right Knee Extension  1    Right Knee Flexion  106      PROM   Right Knee Extension  0    Right Knee Flexion  113      Ambulation/Gait   Gait Comments  gait in the clinic to and from car with the [facility], less fear but still is anxious and hesitant, the left knee seems to be the limiting factor on things like the stairs, shere now she has to go up with the surgical leg first and down with the non surgical leg first due to the left knee pain      High Level Balance   High Level Balance Activities  Negotiating over obstacles    High  Level Balance Comments  close supervision needed, some fear with this, verbal cues needed                   [facility] Adult PT Treatment/Exercise - 12/26/17 0001      Knee/Hip Exercises: Aerobic   Recumbent Bike  5 min seat 6 full revlutions    Nustep  L 4 6 min - LE only      Knee/Hip Exercises: Machines for Strengthening   Cybex Knee Extension  5# RT only 2 sets 10, BIl 10# 10 times    Cybex Knee Flexion  25# 2x10, 15# 10 RT only      Vasopneumatic   Number Minutes Vasopneumatic   10 minutes    Vasopnuematic Location   Knee    Vasopneumatic Pressure  Medium    Vasopneumatic Temperature   37      Manual Therapy   Manual Therapy  Passive ROM    Soft tissue mobilization  STM to the incision/scar    Passive ROM  passive ROM to the right knee flexion               PT Short Term Goals - 11/28/17 1229      PT SHORT TERM GOAL #1   Title  independent iwth initial HEP    Time  2    Period  Weeks    Status  Partially Met         PT Long Term Goals - 12/26/17 1528      PT LONG TERM GOAL #1   Title  decrease TUG time to 27 seconds    Status  Partially Met      PT LONG TERM GOAL #2   Title  increase AROM to 10-110 degrees flexion    Status  Partially Met      PT LONG TERM GOAL #3   Title  walk with SPC in the home    Status  Achieved      PT LONG TERM GOAL #4   Title  be able to go up and down stairs with SBA, one at a time    Status  Partially Met      PT LONG TERM GOAL #5   Title  decrease swelling 3 cm    Status  Achieved            Plan - 12/26/17 1525    Clinical Impression Statement  Overall patient is doing very well, the limiting factors are fear, and the left knee now is limiting some exercises.  The right knee has improved greatly iwht ROM, she is having less pain but still with a constant medial knee pain.  She is now walking iwth a SPC for most distances, there is still fear and hesitancy with this    PT Next Visit Plan  work on gait with [facility] and her confidence, she is afraid of the steps. MD next week and wants to discuss replacing left as she feels it is slowing progress and is hurting more than Rt knee    Consulted and Agree with Plan of Care  Patient       Patient will benefit from skilled therapeutic intervention in order to improve the following deficits and impairments:  Abnormal gait, Decreased range of motion, Difficulty walking, Decreased endurance, Cardiopulmonary status limiting activity, Decreased activity tolerance, Pain, Impaired flexibility, Decreased scar mobility, Decreased balance, Decreased mobility, Decreased strength, Increased edema  Visit Diagnosis: Stiffness of right knee, not elsewhere  classified  Acute pain of right knee  Localized edema  Difficulty in walking, not elsewhere classified     Problem List Patient Active Problem List   Diagnosis Date Noted  . Osteoarthritis of right knee 11/15/2017  . Humerus fracture 03/09/2017  . Asthma  03/09/2017  . Hypothyroidism 03/09/2017  . 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 Genetic testing 02/06/2017  . Acute respiratory failure with hypoxia ([place]) 06/07/2016  . RSV (acute bronchiolitis due to respiratory syncytial virus) 06/07/2016  . Anxiety 06/07/2016  . Arthritis   . Depression 05/30/2016  . Chronic kidney disease (CKD), stage IV (severe) ([place]) 05/30/2016  . Mild intermittent asthma with acute exacerbation   . Respiratory distress   . CKD (chronic kidney disease) stage 3, GFR 30-59 ml/min (HCC)   . Chronic pain syndrome   . Bronchitis 05/29/2016  . Left shoulder pain   . Opacity of lung on imaging study   . Shoulder pain, acute   . Weakness generalized 12/22/2015  . Essential hypertension 12/22/2015  . Pneumonia 12/22/2015  . PVC's (premature ventricular contractions) 12/22/2015  . CAP (community acquired pneumonia) 12/21/2015   PHYSICAL THERAPY DISCHARGE SUMMARY  Visits from Start of Care: 11   Plan: Patient agrees to discharge.  Patient goals were met. Patient is being discharged due to meeting the stated rehab goals.  ?????      Sumner Boast., PT 12/26/2017, 3:29 PM  [address] [place], [place], [address] Phone: [phone]   Fax:  [phone]  Name: Cheryl Lane MRN: [id number] Date of Birth: 03/07/1951

## 2017-12-27 ENCOUNTER — Ambulatory Visit: Payer: Medicare HMO | Admitting: Physical Therapy

## 2017-12-31 DIAGNOSIS — Z96651 Presence of right artificial knee joint: Secondary | ICD-10-CM | POA: Diagnosis not present

## 2017-12-31 DIAGNOSIS — Z471 Aftercare following joint replacement surgery: Secondary | ICD-10-CM | POA: Diagnosis not present

## 2017-12-31 DIAGNOSIS — M1712 Unilateral primary osteoarthritis, left knee: Secondary | ICD-10-CM | POA: Diagnosis not present

## 2017-12-31 MED FILL — HYDROCODON-APAP 5-325: 5-325 | 7 days supply | Qty: 30 | Fill #0

## 2018-01-01 ENCOUNTER — Ambulatory Visit: Payer: Medicare HMO | Admitting: Physical Therapy

## 2018-01-03 ENCOUNTER — Ambulatory Visit: Payer: Medicare HMO | Admitting: Physical Therapy

## 2018-01-08 DIAGNOSIS — F329 Major depressive disorder, single episode, unspecified: Secondary | ICD-10-CM | POA: Diagnosis not present

## 2018-01-08 DIAGNOSIS — E785 Hyperlipidemia, unspecified: Secondary | ICD-10-CM | POA: Diagnosis not present

## 2018-01-08 DIAGNOSIS — R600 Localized edema: Secondary | ICD-10-CM | POA: Diagnosis not present

## 2018-01-08 DIAGNOSIS — Z1339 Encounter for screening examination for other mental health and behavioral disorders: Secondary | ICD-10-CM | POA: Diagnosis not present

## 2018-01-08 DIAGNOSIS — I129 Hypertensive chronic kidney disease with stage 1 through stage 4 chronic kidney disease, or unspecified chronic kidney disease: Secondary | ICD-10-CM | POA: Diagnosis not present

## 2018-01-08 DIAGNOSIS — E039 Hypothyroidism, unspecified: Secondary | ICD-10-CM | POA: Diagnosis not present

## 2018-01-08 DIAGNOSIS — E119 Type 2 diabetes mellitus without complications: Secondary | ICD-10-CM | POA: Diagnosis not present

## 2018-01-08 DIAGNOSIS — N183 Chronic kidney disease, stage 3 (moderate): Secondary | ICD-10-CM | POA: Diagnosis not present

## 2018-01-17 ENCOUNTER — Ambulatory Visit: Payer: Self-pay | Admitting: Orthopedic Surgery

## 2018-01-22 ENCOUNTER — Ambulatory Visit: Payer: Self-pay | Admitting: Orthopedic Surgery

## 2018-01-22 NOTE — H&P (View-Only) (Signed)
TOTAL KNEE ADMISSION H&P  Patient is being admitted for left total knee arthroplasty.  Subjective:  Chief Complaint:left knee pain.  HPI: [place], 67 y.o. female, has a history of pain and functional disability in the left knee due to arthritis and has failed non-surgical conservative treatments for greater than 12 weeks to includeNSAID's and/or analgesics, corticosteriod injections, viscosupplementation injections, flexibility and strengthening excercises, supervised PT with diminished ADL's post treatment, use of assistive devices, weight reduction as appropriate and activity modification.  Onset of symptoms was gradual, starting 4 years ago with gradually worsening course since that time. The patient noted no past surgery on the left knee(s).  Patient currently rates pain in the left knee(s) at 10 out of 10 with activity. Patient has night pain, worsening of pain with activity and weight bearing, pain that interferes with activities of daily living, pain with passive range of motion, crepitus and joint swelling.  Patient has evidence of subchondral cysts, subchondral sclerosis, periarticular osteophytes and joint space narrowing by imaging studies.  There is no active infection.  Patient Active Problem List   Diagnosis Date Noted  . Osteoarthritis of right knee 11/15/2017  . Humerus fracture 03/09/2017  . Asthma 03/09/2017  . Hypothyroidism 03/09/2017  . 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 Genetic testing 02/06/2017  . Acute respiratory failure with hypoxia ([place]) 06/07/2016  . RSV (acute bronchiolitis due to respiratory syncytial virus) 06/07/2016  . Anxiety 06/07/2016  . Arthritis   . Depression 05/30/2016  . Chronic kidney disease (CKD), stage IV (severe) ([place]) 05/30/2016  . Mild intermittent asthma with acute exacerbation   . Respiratory distress   . CKD (chronic kidney disease) stage 3, GFR 30-59 ml/min (HCC)   . Chronic pain syndrome   .  Bronchitis 05/29/2016  . Left shoulder pain   . Opacity of lung on imaging study   . Shoulder pain, acute   . Weakness generalized 12/22/2015  . Essential hypertension 12/22/2015  . Pneumonia 12/22/2015  . PVC's (premature ventricular contractions) 12/22/2015  . CAP (community acquired pneumonia) 12/21/2015   Past Medical History:  Diagnosis Date  . Acute respiratory failure with hypoxia ([place]) 06/07/2016   RESOLVED   . Anemia    as a child  . Anxiety 06/07/2016  . Arthritis   . Asthma    bronchitis  . CAP (community acquired pneumonia) 12/21/2015  . Cervical radiculopathy   . Chronic kidney disease (CKD), stage IV (severe) ([place]) 05/30/2016  . Chronic pain in left shoulder   . CKD (chronic kidney disease)    pt not aware of this  . CKD (chronic kidney disease) stage 3, GFR 30-59 ml/min (HCC)   . Depression   . Hypertension   . Hypothyroidism   . Pneumonia   . Pre-diabetes   . PVC's (premature ventricular contractions) 12/22/2015  . 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 APPENDECTOMY    . KNEE ARTHROPLASTY Right 11/15/2017   Procedure: RIGHT TOTAL KNEE ARTHROPLASTY WITH COMPUTER NAVIGATION;  Surgeon: Rod Can, MD;  Location: [facility];  Service: Orthopedics;  Laterality: Right;  Needs RNFA  . REVERSE SHOULDER ARTHROPLASTY Left 03/01/2017   Procedure: REVERSE SHOULDER ARTHROPLASTY;  Surgeon: Justice Britain, MD;  Location: [place];  Service: Orthopedics;  Laterality: Left;  . TUBAL LIGATION      Current Outpatient Medications  Medication Sig Dispense Refill Last Dose  . aspirin 81 MG chewable tablet  Chew 1 tablet (81 mg total) by mouth 2 (two) times daily. 60 tablet 1   . docusate sodium (COLACE) 100 MG capsule Take 1 capsule (100 mg total) by mouth 2 (two) times daily. 60 capsule 1   . escitalopram (LEXAPRO) 10 MG tablet Take 10 mg daily by mouth.   11/15/2017 at 0430  . furosemide (LASIX) 20 MG  tablet Take 20 mg by mouth daily.    11/14/2017 at Unknown time  . HYDROcodone-acetaminophen (NORCO/VICODIN) 5-325 MG tablet Take 1-2 tablets by mouth every 6 (six) hours as needed (postop knee pain). 30 tablet 0   . levothyroxine (SYNTHROID, LEVOTHROID) 75 MCG tablet Take 75 mcg daily before breakfast by mouth.   11/15/2017 at 0430  . lisinopril (PRINIVIL,ZESTRIL) 10 MG tablet Take 2 tablets (20 mg total) by mouth daily.   11/14/2017 at Unknown time  . methocarbamol (ROBAXIN) 500 MG tablet Take 1 tablet (500 mg total) by mouth 3 (three) times daily as needed for muscle spasms. 30 tablet 1   . ondansetron (ZOFRAN) 4 MG tablet Take 1 tablet (4 mg total) by mouth every 6 (six) hours as needed for nausea. 20 tablet 0   . pravastatin (PRAVACHOL) 20 MG tablet Take 20 mg by mouth 2 (two) times a week.   11/11/2017  . senna (SENOKOT) 8.6 MG TABS tablet Take 1 tablet (8.6 mg total) by mouth 2 (two) times daily. 120 each 0    No current facility-administered medications for this visit.    No Known Allergies  Social History   Tobacco Use  . Smoking status: Never Smoker  . Smokeless tobacco: Never Used  Substance Use Topics  . Alcohol use: No    Family History  Problem Relation Age of Onset  . Hypertension Other   . Breast cancer Mother 36  . Breast cancer Sister 102  . Lung cancer Brother   . Cancer Sister 31       "female" cancer  . Cancer Sister        unknown cancer  . Breast cancer Sister 56     Review of Systems  Constitutional: Negative.   HENT: Negative.   Eyes: Negative.   Respiratory: Negative.   Cardiovascular: Negative.   Gastrointestinal: Negative.   Genitourinary: Negative.   Musculoskeletal: Positive for joint pain.  Skin: Negative.   Neurological: Negative.   Endo/Heme/Allergies: Negative.   Psychiatric/Behavioral: Negative.     Objective:  Physical Exam  Vitals reviewed. Constitutional: She is oriented to person, place, and time. She appears well-developed and  well-nourished.  HENT:  Head: Normocephalic and atraumatic.  Eyes: Pupils are equal, round, and reactive to light. Conjunctivae and EOM are normal.  Neck: Normal range of motion. Neck supple.  Cardiovascular: Normal rate and intact distal pulses.  Respiratory: Effort normal. No respiratory distress.  GI: Soft. She exhibits no distension.  Genitourinary:  Genitourinary Comments: deferred  Musculoskeletal:       Left knee: She exhibits decreased range of motion, swelling, effusion, deformity and abnormal alignment. Tenderness found. Medial joint line and lateral joint line tenderness noted.  Neurological: She is alert and oriented to person, place, and time. She has normal reflexes.  Skin: Skin is warm and dry.  Psychiatric: She has a normal mood and affect. Her behavior is normal. Judgment and thought content normal.    Vital signs in last 24 hours: @VSRANGES @  Labs:   Estimated body mass index is 39.77 kg/m as calculated from the following:   Height as of  11/15/17: 5\' 5"  (1.651 m).   Weight as of 11/15/17: 108.4 kg.   Imaging Review Plain radiographs demonstrate severe degenerative joint disease of the left knee(s). The overall alignment issignificant valgus. The bone quality appears to be adequate for age and reported activity level.   Preoperative templating of the joint replacement has been completed, documented, and submitted to the Operating Room personnel in order to optimize intra-operative equipment management.   Anticipated LOS equal to or greater than 2 midnights due to - Age 15 and older with one or more of the following:  - Obesity  - Expected need for hospital services (PT, OT, Nursing) required for safe  discharge  - Anticipated need for postoperative skilled nursing care or inpatient rehab  - Active co-morbidities: Diabetes OR   - Unanticipated findings during/Post Surgery: None  - Patient is a high risk of re-admission due to:  None     Assessment/Plan:  End stage arthritis, left knee   The patient history, physical examination, clinical judgment of the provider and imaging studies are consistent with end stage degenerative joint disease of the left knee(s) and total knee arthroplasty is deemed medically necessary. The treatment options including medical management, injection therapy arthroscopy and arthroplasty were discussed at length. The risks and benefits of total knee arthroplasty were presented and reviewed. The risks due to aseptic loosening, infection, stiffness, patella tracking problems, thromboembolic complications and other imponderables were discussed. The patient acknowledged the explanation, agreed to proceed with the plan and consent was signed. Patient is being admitted for inpatient treatment for surgery, pain control, PT, OT, prophylactic antibiotics, VTE prophylaxis, progressive ambulation and ADL's and discharge planning. The patient is planning to be discharged home with outpatient PT at [facility]. Has DME.

## 2018-01-22 NOTE — H&P (Signed)
TOTAL KNEE ADMISSION H&P  Patient is being admitted for left total knee arthroplasty.  Subjective:  Chief Complaint:left knee pain.  HPI: [place], 67 y.o. female, has a history of pain and functional disability in the left knee due to arthritis and has failed non-surgical conservative treatments for greater than 12 weeks to includeNSAID's and/or analgesics, corticosteriod injections, viscosupplementation injections, flexibility and strengthening excercises, supervised PT with diminished ADL's post treatment, use of assistive devices, weight reduction as appropriate and activity modification.  Onset of symptoms was gradual, starting 4 years ago with gradually worsening course since that time. The patient noted no past surgery on the left knee(s).  Patient currently rates pain in the left knee(s) at 10 out of 10 with activity. Patient has night pain, worsening of pain with activity and weight bearing, pain that interferes with activities of daily living, pain with passive range of motion, crepitus and joint swelling.  Patient has evidence of subchondral cysts, subchondral sclerosis, periarticular osteophytes and joint space narrowing by imaging studies.  There is no active infection.  Patient Active Problem List   Diagnosis Date Noted  . Osteoarthritis of right knee 11/15/2017  . Humerus fracture 03/09/2017  . Asthma 03/09/2017  . Hypothyroidism 03/09/2017  . 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 Genetic testing 02/06/2017  . Acute respiratory failure with hypoxia ([place]) 06/07/2016  . RSV (acute bronchiolitis due to respiratory syncytial virus) 06/07/2016  . Anxiety 06/07/2016  . Arthritis   . Depression 05/30/2016  . Chronic kidney disease (CKD), stage IV (severe) ([place]) 05/30/2016  . Mild intermittent asthma with acute exacerbation   . Respiratory distress   . CKD (chronic kidney disease) stage 3, GFR 30-59 ml/min (HCC)   . Chronic pain syndrome   .  Bronchitis 05/29/2016  . Left shoulder pain   . Opacity of lung on imaging study   . Shoulder pain, acute   . Weakness generalized 12/22/2015  . Essential hypertension 12/22/2015  . Pneumonia 12/22/2015  . PVC's (premature ventricular contractions) 12/22/2015  . CAP (community acquired pneumonia) 12/21/2015   Past Medical History:  Diagnosis Date  . Acute respiratory failure with hypoxia ([place]) 06/07/2016   RESOLVED   . Anemia    as a child  . Anxiety 06/07/2016  . Arthritis   . Asthma    bronchitis  . CAP (community acquired pneumonia) 12/21/2015  . Cervical radiculopathy   . Chronic kidney disease (CKD), stage IV (severe) ([place]) 05/30/2016  . Chronic pain in left shoulder   . CKD (chronic kidney disease)    pt not aware of this  . CKD (chronic kidney disease) stage 3, GFR 30-59 ml/min (HCC)   . Depression   . Hypertension   . Hypothyroidism   . Pneumonia   . Pre-diabetes   . PVC's (premature ventricular contractions) 12/22/2015  . 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 APPENDECTOMY    . KNEE ARTHROPLASTY Right 11/15/2017   Procedure: RIGHT TOTAL KNEE ARTHROPLASTY WITH COMPUTER NAVIGATION;  Surgeon: Rod Can, MD;  Location: [facility];  Service: Orthopedics;  Laterality: Right;  Needs RNFA  . REVERSE SHOULDER ARTHROPLASTY Left 03/01/2017   Procedure: REVERSE SHOULDER ARTHROPLASTY;  Surgeon: Justice Britain, MD;  Location: [place];  Service: Orthopedics;  Laterality: Left;  . TUBAL LIGATION      Current Outpatient Medications  Medication Sig Dispense Refill Last Dose  . aspirin 81 MG chewable tablet  Chew 1 tablet (81 mg total) by mouth 2 (two) times daily. 60 tablet 1   . docusate sodium (COLACE) 100 MG capsule Take 1 capsule (100 mg total) by mouth 2 (two) times daily. 60 capsule 1   . escitalopram (LEXAPRO) 10 MG tablet Take 10 mg daily by mouth.   11/15/2017 at 0430  . furosemide (LASIX) 20 MG  tablet Take 20 mg by mouth daily.    11/14/2017 at Unknown time  . HYDROcodone-acetaminophen (NORCO/VICODIN) 5-325 MG tablet Take 1-2 tablets by mouth every 6 (six) hours as needed (postop knee pain). 30 tablet 0   . levothyroxine (SYNTHROID, LEVOTHROID) 75 MCG tablet Take 75 mcg daily before breakfast by mouth.   11/15/2017 at 0430  . lisinopril (PRINIVIL,ZESTRIL) 10 MG tablet Take 2 tablets (20 mg total) by mouth daily.   11/14/2017 at Unknown time  . methocarbamol (ROBAXIN) 500 MG tablet Take 1 tablet (500 mg total) by mouth 3 (three) times daily as needed for muscle spasms. 30 tablet 1   . ondansetron (ZOFRAN) 4 MG tablet Take 1 tablet (4 mg total) by mouth every 6 (six) hours as needed for nausea. 20 tablet 0   . pravastatin (PRAVACHOL) 20 MG tablet Take 20 mg by mouth 2 (two) times a week.   11/11/2017  . senna (SENOKOT) 8.6 MG TABS tablet Take 1 tablet (8.6 mg total) by mouth 2 (two) times daily. 120 each 0    No current facility-administered medications for this visit.    No Known Allergies  Social History   Tobacco Use  . Smoking status: Never Smoker  . Smokeless tobacco: Never Used  Substance Use Topics  . Alcohol use: No    Family History  Problem Relation Age of Onset  . Hypertension Other   . Breast cancer Mother 77  . Breast cancer Sister 68  . Lung cancer Brother   . Cancer Sister 88       "female" cancer  . Cancer Sister        unknown cancer  . Breast cancer Sister 42     Review of Systems  Constitutional: Negative.   HENT: Negative.   Eyes: Negative.   Respiratory: Negative.   Cardiovascular: Negative.   Gastrointestinal: Negative.   Genitourinary: Negative.   Musculoskeletal: Positive for joint pain.  Skin: Negative.   Neurological: Negative.   Endo/Heme/Allergies: Negative.   Psychiatric/Behavioral: Negative.     Objective:  Physical Exam  Vitals reviewed. Constitutional: She is oriented to person, place, and time. She appears well-developed and  well-nourished.  HENT:  Head: Normocephalic and atraumatic.  Eyes: Pupils are equal, round, and reactive to light. Conjunctivae and EOM are normal.  Neck: Normal range of motion. Neck supple.  Cardiovascular: Normal rate and intact distal pulses.  Respiratory: Effort normal. No respiratory distress.  GI: Soft. She exhibits no distension.  Genitourinary:  Genitourinary Comments: deferred  Musculoskeletal:       Left knee: She exhibits decreased range of motion, swelling, effusion, deformity and abnormal alignment. Tenderness found. Medial joint line and lateral joint line tenderness noted.  Neurological: She is alert and oriented to person, place, and time. She has normal reflexes.  Skin: Skin is warm and dry.  Psychiatric: She has a normal mood and affect. Her behavior is normal. Judgment and thought content normal.    Vital signs in last 24 hours: @VSRANGES @  Labs:   Estimated body mass index is 39.77 kg/m as calculated from the following:   Height as of  11/15/17: 5\' 5"  (1.651 m).   Weight as of 11/15/17: 108.4 kg.   Imaging Review Plain radiographs demonstrate severe degenerative joint disease of the left knee(s). The overall alignment issignificant valgus. The bone quality appears to be adequate for age and reported activity level.   Preoperative templating of the joint replacement has been completed, documented, and submitted to the Operating Room personnel in order to optimize intra-operative equipment management.   Anticipated LOS equal to or greater than 2 midnights due to - Age 65 and older with one or more of the following:  - Obesity  - Expected need for hospital services (PT, OT, Nursing) required for safe  discharge  - Anticipated need for postoperative skilled nursing care or inpatient rehab  - Active co-morbidities: Diabetes OR   - Unanticipated findings during/Post Surgery: None  - Patient is a high risk of re-admission due to:  None     Assessment/Plan:  End stage arthritis, left knee   The patient history, physical examination, clinical judgment of the provider and imaging studies are consistent with end stage degenerative joint disease of the left knee(s) and total knee arthroplasty is deemed medically necessary. The treatment options including medical management, injection therapy arthroscopy and arthroplasty were discussed at length. The risks and benefits of total knee arthroplasty were presented and reviewed. The risks due to aseptic loosening, infection, stiffness, patella tracking problems, thromboembolic complications and other imponderables were discussed. The patient acknowledged the explanation, agreed to proceed with the plan and consent was signed. Patient is being admitted for inpatient treatment for surgery, pain control, PT, OT, prophylactic antibiotics, VTE prophylaxis, progressive ambulation and ADL's and discharge planning. The patient is planning to be discharged home with outpatient PT at [facility]. Has DME.

## 2018-01-25 DIAGNOSIS — S42292D Other displaced fracture of upper end of left humerus, subsequent encounter for fracture with routine healing: Secondary | ICD-10-CM | POA: Diagnosis not present

## 2018-01-25 DIAGNOSIS — M1990 Unspecified osteoarthritis, unspecified site: Secondary | ICD-10-CM | POA: Diagnosis not present

## 2018-01-25 DIAGNOSIS — M5412 Radiculopathy, cervical region: Secondary | ICD-10-CM | POA: Diagnosis not present

## 2018-01-25 DIAGNOSIS — M9983 Other biomechanical lesions of lumbar region: Secondary | ICD-10-CM | POA: Diagnosis not present

## 2018-01-25 DIAGNOSIS — M170 Bilateral primary osteoarthritis of knee: Secondary | ICD-10-CM | POA: Diagnosis not present

## 2018-01-25 DIAGNOSIS — Z96612 Presence of left artificial shoulder joint: Secondary | ICD-10-CM | POA: Diagnosis not present

## 2018-01-25 DIAGNOSIS — M5136 Other intervertebral disc degeneration, lumbar region: Secondary | ICD-10-CM | POA: Diagnosis not present

## 2018-01-25 DIAGNOSIS — N184 Chronic kidney disease, stage 4 (severe): Secondary | ICD-10-CM | POA: Diagnosis not present

## 2018-02-05 ENCOUNTER — Encounter (HOSPITAL_COMMUNITY): Payer: Self-pay

## 2018-02-05 NOTE — Pre-Procedure Instructions (Signed)
EKG 11/18/2017 in epic

## 2018-02-05 NOTE — Patient Instructions (Signed)
Your procedure is scheduled on: Thursday, Oct. 24, 2019   Surgery Time:  7:30AM-10:00AM   Report to [facility]  Entrance    Report to admitting at 5:30 AM   Call this number if you have problems the morning of surgery [phone]   Do not eat food or drink liquids :After Midnight.   Brush your teeth the morning of surgery.   Do NOT smoke after Midnight   Take these medicines the morning of surgery with A SIP OF WATER: Escitalopram, Levothyroxine              You may not have any metal on your body including hair pins, jewelry, and body piercings             Do not wear make-up, lotions, powders, perfumes/cologne, or deodorant             Do not wear nail polish.  Do not shave  48 hours prior to surgery.               Do not bring valuables to the hospital. [place].   Contacts, dentures or bridgework may not be worn into surgery.   Leave suitcase in the car. After surgery it may be brought to your room.    Special Instructions: Bring a copy of your healthcare power of attorney and living will documents         the day of surgery if you haven't scanned them in before.              Please read over the following fact sheets you were given:  [facility] - Preparing for Surgery Before surgery, you can play an important role.  Because skin is not sterile, your skin needs to be as free of germs as possible.  You can reduce the number of germs on your skin by washing with CHG (chlorahexidine gluconate) soap before surgery.  CHG is an antiseptic cleaner which kills germs and bonds with the skin to continue killing germs even after washing. Please DO NOT use if you have an allergy to CHG or antibacterial soaps.  If your skin becomes reddened/irritated stop using the CHG and inform your nurse when you arrive at Short Stay. Do not shave (including legs and underarms) for at least 48 hours prior to the first CHG shower.   You may shave your face/neck.  Please follow these instructions carefully:  1.  Shower with CHG Soap the night before surgery and the  morning of surgery.  2.  If you choose to wash your hair, wash your hair first as usual with your normal  shampoo.  3.  After you shampoo, rinse your hair and body thoroughly to remove the shampoo.                             4.  Use CHG as you would any other liquid soap.  You can apply chg directly to the skin and wash.  Gently with a scrungie or clean washcloth.  5.  Apply the CHG Soap to your body ONLY FROM THE NECK DOWN.   Do   not use on face/ open  Wound or open sores. Avoid contact with eyes, ears mouth and   genitals (private parts).                       Wash face,  Genitals (private parts) with your normal soap.             6.  Wash thoroughly, paying special attention to the area where your    surgery  will be performed.  7.  Thoroughly rinse your body with warm water from the neck down.  8.  DO NOT shower/wash with your normal soap after using and rinsing off the CHG Soap.                9.  Pat yourself dry with a clean towel.            10.  Wear clean pajamas.            11.  Place clean sheets on your bed the night of your first shower and do not  sleep with pets. Day of Surgery : Do not apply any lotions/deodorants the morning of surgery.  Please wear clean clothes to the hospital/surgery center.  FAILURE TO FOLLOW THESE INSTRUCTIONS MAY RESULT IN THE CANCELLATION OF YOUR SURGERY  PATIENT SIGNATURE_________________________________  NURSE SIGNATURE__________________________________  ________________________________________________________________________   Adam Phenix  An incentive spirometer is a tool that can help keep your lungs clear and active. This tool measures how well you are filling your lungs with each breath. Taking long deep breaths may help reverse or decrease the chance of developing  breathing (pulmonary) problems (especially infection) following:  A long period of time when you are unable to move or be active. BEFORE THE PROCEDURE   If the spirometer includes an indicator to show your best effort, your nurse or respiratory therapist will set it to a desired goal.  If possible, sit up straight or lean slightly forward. Try not to slouch.  Hold the incentive spirometer in an upright position. INSTRUCTIONS FOR USE  1. Sit on the edge of your bed if possible, or sit up as far as you can in bed or on a chair. 2. Hold the incentive spirometer in an upright position. 3. Breathe out normally. 4. Place the mouthpiece in your mouth and seal your lips tightly around it. 5. Breathe in slowly and as deeply as possible, raising the piston or the ball toward the top of the column. 6. Hold your breath for 3-5 seconds or for as long as possible. Allow the piston or ball to fall to the bottom of the column. 7. Remove the mouthpiece from your mouth and breathe out normally. 8. Rest for a few seconds and repeat Steps 1 through 7 at least 10 times every 1-2 hours when you are awake. Take your time and take a few normal breaths between deep breaths. 9. The spirometer may include an indicator to show your best effort. Use the indicator as a goal to work toward during each repetition. 10. After each set of 10 deep breaths, practice coughing to be sure your lungs are clear. If you have an incision (the cut made at the time of surgery), support your incision when coughing by placing a pillow or rolled up towels firmly against it. Once you are able to get out of bed, walk around indoors and cough well. You may stop using the incentive spirometer when instructed by your caregiver.  RISKS AND COMPLICATIONS  Take your time  so you do not get dizzy or light-headed.  If you are in pain, you may need to take or ask for pain medication before doing incentive spirometry. It is harder to take a deep  breath if you are having pain. AFTER USE  Rest and breathe slowly and easily.  It can be helpful to keep track of a log of your progress. Your caregiver can provide you with a simple table to help with this. If you are using the spirometer at home, follow these instructions: [place] IF:   You are having difficultly using the spirometer.  You have trouble using the spirometer as often as instructed.  Your pain medication is not giving enough relief while using the spirometer.  You develop fever of 100.5 F (38.1 C) or higher. SEEK IMMEDIATE MEDICAL CARE IF:   You cough up bloody sputum that had not been present before.  You develop fever of 102 F (38.9 C) or greater.  You develop worsening pain at or near the incision site. MAKE SURE YOU:   Understand these instructions.  Will watch your condition.  Will get help right away if you are not doing well or get worse. Document Released: 08/21/2006 Document Revised: 07/03/2011 Document Reviewed: 10/22/2006 Exit[employer] Patient Information 2014 Exit[employer], [place].   ________________________________________________________________________  WHAT IS A BLOOD TRANSFUSION? Blood Transfusion Information  A transfusion is the replacement of blood or some of its parts. Blood is made up of multiple cells which provide different functions.  Red blood cells carry oxygen and are used for blood loss replacement.  White blood cells fight against infection.  Platelets control bleeding.  Plasma helps clot blood.  Other blood products are available for specialized needs, such as hemophilia or other clotting disorders. BEFORE THE TRANSFUSION  Who gives blood for transfusions?   Healthy volunteers who are fully evaluated to make sure their blood is safe. This is blood bank blood. Transfusion therapy is the safest it has ever been in the practice of medicine. Before blood is taken from a donor, a complete history is taken to make sure  that person has no history of diseases nor engages in risky social behavior (examples are intravenous drug use or sexual activity with multiple partners). The donor's travel history is screened to minimize risk of transmitting infections, such as malaria. The donated blood is tested for signs of infectious diseases, such as HIV and hepatitis. The blood is then tested to be sure it is compatible with you in order to minimize the chance of a transfusion reaction. If you or a relative donates blood, this is often done in anticipation of surgery and is not appropriate for emergency situations. It takes many days to process the donated blood. RISKS AND COMPLICATIONS Although transfusion therapy is very safe and saves many lives, the main dangers of transfusion include:   Getting an infectious disease.  Developing a transfusion reaction. This is an allergic reaction to something in the blood you were given. Every precaution is taken to prevent this. The decision to have a blood transfusion has been considered carefully by your caregiver before blood is given. Blood is not given unless the benefits outweigh the risks. AFTER THE TRANSFUSION  Right after receiving a blood transfusion, you will usually feel much better and more energetic. This is especially true if your red blood cells have gotten low (anemic). The transfusion raises the level of the red blood cells which carry oxygen, and this usually causes an energy increase.  The  nurse administering the transfusion will monitor you carefully for complications. HOME CARE INSTRUCTIONS  No special instructions are needed after a transfusion. You may find your energy is better. Speak with your caregiver about any limitations on activity for underlying diseases you may have. SEEK MEDICAL CARE IF:   Your condition is not improving after your transfusion.  You develop redness or irritation at the intravenous (IV) site. SEEK IMMEDIATE MEDICAL CARE IF:  Any of  the following symptoms occur over the next 12 hours:  Shaking chills.  You have a temperature by mouth above 102 F (38.9 C), not controlled by medicine.  Chest, back, or muscle pain.  People around you feel you are not acting correctly or are confused.  Shortness of breath or difficulty breathing.  Dizziness and fainting.  You get a rash or develop hives.  You have a decrease in urine output.  Your urine turns a dark color or changes to pink, red, or brown. Any of the following symptoms occur over the next 10 days:  You have a temperature by mouth above 102 F (38.9 C), not controlled by medicine.  Shortness of breath.  Weakness after normal activity.  The white part of the eye turns yellow (jaundice).  You have a decrease in the amount of urine or are urinating less often.  Your urine turns a dark color or changes to pink, red, or brown. Document Released: 04/07/2000 Document Revised: 07/03/2011 Document Reviewed: 11/25/2007 [facility] Patient Information 2014 [place], [place].  _______________________________________________________________________

## 2018-02-06 ENCOUNTER — Other Ambulatory Visit: Payer: Self-pay

## 2018-02-06 ENCOUNTER — Encounter (HOSPITAL_COMMUNITY)
Admission: RE | Admit: 2018-02-06 | Discharge: 2018-02-06 | Disposition: A | Discharge status: Home / Self Care | Payer: Medicare HMO | Source: Ambulatory Visit | Attending: Orthopedic Surgery | Admitting: Orthopedic Surgery

## 2018-02-06 ENCOUNTER — Encounter (HOSPITAL_COMMUNITY): Payer: Self-pay

## 2018-02-06 DIAGNOSIS — E039 Hypothyroidism, unspecified: Secondary | ICD-10-CM | POA: Diagnosis not present

## 2018-02-06 DIAGNOSIS — R7303 Prediabetes: Secondary | ICD-10-CM | POA: Insufficient documentation

## 2018-02-06 DIAGNOSIS — M13862 Other specified arthritis, left knee: Secondary | ICD-10-CM | POA: Insufficient documentation

## 2018-02-06 DIAGNOSIS — Z79899 Other long term (current) drug therapy: Secondary | ICD-10-CM | POA: Diagnosis not present

## 2018-02-06 DIAGNOSIS — Z7982 Long term (current) use of aspirin: Secondary | ICD-10-CM | POA: Diagnosis not present

## 2018-02-06 DIAGNOSIS — Z79890 Hormone replacement therapy (postmenopausal): Secondary | ICD-10-CM | POA: Insufficient documentation

## 2018-02-06 DIAGNOSIS — Z01818 Encounter for other preprocedural examination: Secondary | ICD-10-CM | POA: Insufficient documentation

## 2018-02-06 HISTORY — DX: Unspecified fracture of shaft of humerus, unspecified arm, initial encounter for closed fracture: S42.309A

## 2018-02-06 HISTORY — DX: Heart disease, unspecified: I51.9

## 2018-02-06 HISTORY — DX: Atherosclerosis of aorta: I70.0

## 2018-02-06 HISTORY — DX: Personal history of other diseases of the digestive system: Z87.19

## 2018-02-06 HISTORY — DX: Cardiomegaly: I51.7

## 2018-02-06 LAB — CBC
HEMATOCRIT: 39.3 % (ref 36.0–46.0)
HEMOGLOBIN: 11.6 g/dL — AB (ref 12.0–15.0)
MCH: 26.3 pg (ref 26.0–34.0)
MCHC: 29.5 g/dL — ABNORMAL LOW (ref 30.0–36.0)
MCV: 89.1 fL (ref 80.0–100.0)
Platelets: 286 10*3/uL (ref 150–400)
RBC: 4.41 MIL/uL (ref 3.87–5.11)
RDW: 14.9 % (ref 11.5–15.5)
WBC: 3.8 10*3/uL — ABNORMAL LOW (ref 4.0–10.5)
nRBC: 0 % (ref 0.0–0.2)

## 2018-02-06 LAB — BASIC METABOLIC PANEL
Anion gap: 7 (ref 5–15)
BUN: 29 mg/dL — AB (ref 8–23)
CHLORIDE: 105 mmol/L (ref 98–111)
CO2: 29 mmol/L (ref 22–32)
Calcium: 9.2 mg/dL (ref 8.9–10.3)
Creatinine, Ser: 1.15 mg/dL — ABNORMAL HIGH (ref 0.44–1.00)
GFR calc Af Amer: 55 mL/min — ABNORMAL LOW (ref >60)
GFR calc non Af Amer: 47 mL/min — ABNORMAL LOW (ref >60)
Glucose, Bld: 114 mg/dL — ABNORMAL HIGH (ref 70–99)
POTASSIUM: 4.3 mmol/L (ref 3.5–5.1)
SODIUM: 141 mmol/L (ref 135–145)

## 2018-02-06 LAB — HEMOGLOBIN A1C
HEMOGLOBIN A1C: 5.5 % (ref 4.8–5.6)
Mean Plasma Glucose: 111.15 mg/dL

## 2018-02-06 LAB — SURGICAL PCR SCREEN
MRSA, PCR: NEGATIVE
STAPHYLOCOCCUS AUREUS: NEGATIVE

## 2018-02-06 NOTE — Progress Notes (Signed)
BMET RESULTS FAXED TO DR Ridott

## 2018-02-13 NOTE — Anesthesia Preprocedure Evaluation (Addendum)
Anesthesia Evaluation  Patient identified by MRN, date of birth, ID band Patient awake    Reviewed: Allergy & Precautions, NPO status , Patient's Chart, lab work & pertinent test results  Airway Mallampati: II  TM Distance: >3 FB     Dental   Pulmonary asthma , pneumonia,    breath sounds clear to auscultation       Cardiovascular hypertension,  Rhythm:Regular Rate:Normal     Neuro/Psych Anxiety Depression  Neuromuscular disease    GI/Hepatic hiatal hernia,   Endo/Other  Hypothyroidism   Renal/GU Renal disease     Musculoskeletal  (+) Arthritis ,   Abdominal   Peds  Hematology  (+) anemia ,   Anesthesia Other Findings   Reproductive/Obstetrics                            Anesthesia Physical Anesthesia Plan  ASA: III  Anesthesia Plan: General   Post-op Pain Management:    Induction:   PONV Risk Score and Plan: Treatment may vary due to age or medical condition, Ondansetron, Dexamethasone and Midazolam  Airway Management Planned: Oral ETT  Additional Equipment:   Intra-op Plan:   Post-operative Plan: Extubation in OR  Informed Consent: I have reviewed the patients History and Physical, chart, labs and discussed the procedure including the risks, benefits and alternatives for the proposed anesthesia with the patient or authorized representative who has indicated his/her understanding and acceptance.     Plan Discussed with: CRNA, Anesthesiologist and Surgeon  Anesthesia Plan Comments:        Anesthesia Quick Evaluation

## 2018-02-14 ENCOUNTER — Inpatient Hospital Stay (HOSPITAL_COMMUNITY): Payer: Medicare HMO | Admitting: Anesthesiology

## 2018-02-14 ENCOUNTER — Inpatient Hospital Stay (HOSPITAL_COMMUNITY)
Admission: RE | Admit: 2018-02-14 | Discharge: 2018-02-17 | DRG: 470 | Disposition: A | Discharge status: Home / Self Care | Payer: Medicare HMO | Source: Ambulatory Visit | Attending: Orthopedic Surgery | Admitting: Orthopedic Surgery

## 2018-02-14 ENCOUNTER — Inpatient Hospital Stay (HOSPITAL_COMMUNITY): Payer: Medicare HMO

## 2018-02-14 ENCOUNTER — Encounter (HOSPITAL_COMMUNITY): Payer: Self-pay | Admitting: *Deleted

## 2018-02-14 ENCOUNTER — Other Ambulatory Visit: Payer: Self-pay

## 2018-02-14 ENCOUNTER — Encounter (HOSPITAL_COMMUNITY)
Admission: RE | Disposition: A | Discharge status: Home / Self Care | Payer: Self-pay | Source: Ambulatory Visit | Attending: Orthopedic Surgery

## 2018-02-14 DIAGNOSIS — Z23 Encounter for immunization: Secondary | ICD-10-CM | POA: Diagnosis not present

## 2018-02-14 DIAGNOSIS — Z96612 Presence of left artificial shoulder joint: Secondary | ICD-10-CM | POA: Diagnosis present

## 2018-02-14 DIAGNOSIS — R7303 Prediabetes: Secondary | ICD-10-CM | POA: Diagnosis not present

## 2018-02-14 DIAGNOSIS — Z96652 Presence of left artificial knee joint: Secondary | ICD-10-CM

## 2018-02-14 DIAGNOSIS — Z7982 Long term (current) use of aspirin: Secondary | ICD-10-CM

## 2018-02-14 DIAGNOSIS — Z6841 Body Mass Index (BMI) 40.0 and over, adult: Secondary | ICD-10-CM

## 2018-02-14 DIAGNOSIS — E039 Hypothyroidism, unspecified: Secondary | ICD-10-CM | POA: Diagnosis present

## 2018-02-14 DIAGNOSIS — I129 Hypertensive chronic kidney disease with stage 1 through stage 4 chronic kidney disease, or unspecified chronic kidney disease: Secondary | ICD-10-CM | POA: Diagnosis present

## 2018-02-14 DIAGNOSIS — N184 Chronic kidney disease, stage 4 (severe): Secondary | ICD-10-CM | POA: Diagnosis present

## 2018-02-14 DIAGNOSIS — E669 Obesity, unspecified: Secondary | ICD-10-CM | POA: Diagnosis present

## 2018-02-14 DIAGNOSIS — Z471 Aftercare following joint replacement surgery: Secondary | ICD-10-CM | POA: Diagnosis not present

## 2018-02-14 DIAGNOSIS — M1712 Unilateral primary osteoarthritis, left knee: Secondary | ICD-10-CM | POA: Diagnosis not present

## 2018-02-14 DIAGNOSIS — Z79899 Other long term (current) drug therapy: Secondary | ICD-10-CM | POA: Diagnosis not present

## 2018-02-14 DIAGNOSIS — Z96651 Presence of right artificial knee joint: Secondary | ICD-10-CM | POA: Diagnosis not present

## 2018-02-14 DIAGNOSIS — J45909 Unspecified asthma, uncomplicated: Secondary | ICD-10-CM | POA: Diagnosis not present

## 2018-02-14 DIAGNOSIS — F329 Major depressive disorder, single episode, unspecified: Secondary | ICD-10-CM | POA: Diagnosis present

## 2018-02-14 DIAGNOSIS — G8918 Other acute postprocedural pain: Secondary | ICD-10-CM | POA: Diagnosis not present

## 2018-02-14 HISTORY — PX: KNEE ARTHROPLASTY: SHX992

## 2018-02-14 LAB — TYPE AND SCREEN
ABO/RH(D): B POS
Antibody Screen: NEGATIVE

## 2018-02-14 SURGERY — ARTHROPLASTY, KNEE, TOTAL, USING IMAGELESS COMPUTER-ASSISTED NAVIGATION
Anesthesia: General | Site: Knee | Laterality: Left

## 2018-02-14 MED ORDER — KETOROLAC TROMETHAMINE 30 MG/ML IJ SOLN
INTRAMUSCULAR | Status: DC | PRN
  Administered 2018-02-14: 30 mg

## 2018-02-14 MED ORDER — POVIDONE-IODINE 10 % EX SWAB
2.0000 "application " | Freq: Once | CUTANEOUS | Status: AC
  Administered 2018-02-14: 2 via TOPICAL

## 2018-02-14 MED ORDER — INFLUENZA VAC SPLIT HIGH-DOSE 0.5 ML IM SUSY
0.5000 mL | PREFILLED_SYRINGE | INTRAMUSCULAR | Status: AC
  Administered 2018-02-17: 0.5 mL via INTRAMUSCULAR
  Filled 2018-02-14: qty 0.5

## 2018-02-14 MED ORDER — SODIUM CHLORIDE 0.9 % IV SOLN: INTRAVENOUS | Status: DC

## 2018-02-14 MED ORDER — FENTANYL CITRATE (PF) 100 MCG/2ML IJ SOLN
INTRAMUSCULAR | Status: AC
  Filled 2018-02-14: qty 2

## 2018-02-14 MED ORDER — MENTHOL 3 MG MT LOZG: 1.0000 | LOZENGE | OROMUCOSAL | Status: DC | PRN

## 2018-02-14 MED ORDER — ONDANSETRON HCL 4 MG/2ML IJ SOLN: 4.0000 mg | Freq: Four times a day (QID) | INTRAMUSCULAR | Status: DC | PRN

## 2018-02-14 MED ORDER — FUROSEMIDE 20 MG PO TABS
20.0000 mg | ORAL_TABLET | Freq: Every day | ORAL | Status: DC
  Filled 2018-02-14 (×2): qty 1

## 2018-02-14 MED ORDER — CEFAZOLIN SODIUM-DEXTROSE 2-4 GM/100ML-% IV SOLN
2.0000 g | Freq: Four times a day (QID) | INTRAVENOUS | Status: AC
  Administered 2018-02-14 (×2): 2 g via INTRAVENOUS
  Filled 2018-02-14 (×2): qty 100

## 2018-02-14 MED ORDER — LEVOTHYROXINE SODIUM 75 MCG PO TABS
75.0000 ug | ORAL_TABLET | Freq: Every day | ORAL | Status: DC
  Administered 2018-02-15 – 2018-02-17 (×3): 75 ug via ORAL
  Filled 2018-02-14 (×3): qty 1

## 2018-02-14 MED ORDER — ACETAMINOPHEN 10 MG/ML IV SOLN
1000.0000 mg | INTRAVENOUS | Status: AC
  Administered 2018-02-14: 1000 mg via INTRAVENOUS
  Filled 2018-02-14: qty 100

## 2018-02-14 MED ORDER — SUGAMMADEX SODIUM 200 MG/2ML IV SOLN
INTRAVENOUS | Status: DC | PRN
  Administered 2018-02-14: 200 mg via INTRAVENOUS

## 2018-02-14 MED ORDER — MORPHINE SULFATE (PF) 2 MG/ML IV SOLN
0.5000 mg | INTRAVENOUS | Status: DC | PRN
  Administered 2018-02-16: 1 mg via INTRAVENOUS
  Filled 2018-02-14: qty 1

## 2018-02-14 MED ORDER — SENNA 8.6 MG PO TABS
1.0000 | ORAL_TABLET | Freq: Two times a day (BID) | ORAL | Status: DC
  Administered 2018-02-14 – 2018-02-17 (×6): 8.6 mg via ORAL
  Filled 2018-02-14 (×6): qty 1

## 2018-02-14 MED ORDER — LIDOCAINE HCL (CARDIAC) PF 100 MG/5ML IV SOSY
PREFILLED_SYRINGE | INTRAVENOUS | Status: DC | PRN
  Administered 2018-02-14: 80 mg via INTRAVENOUS

## 2018-02-14 MED ORDER — ISOPROPYL ALCOHOL 70 % SOLN
Status: AC
  Filled 2018-02-14: qty 480

## 2018-02-14 MED ORDER — MIDAZOLAM HCL 5 MG/5ML IJ SOLN
INTRAMUSCULAR | Status: DC | PRN
  Administered 2018-02-14 (×2): 1 mg via INTRAVENOUS

## 2018-02-14 MED ORDER — METOCLOPRAMIDE HCL 5 MG/ML IJ SOLN: 5.0000 mg | Freq: Three times a day (TID) | INTRAMUSCULAR | Status: DC | PRN

## 2018-02-14 MED ORDER — SODIUM CHLORIDE 0.9 % IR SOLN
Status: DC | PRN
  Administered 2018-02-14: 3000 mL

## 2018-02-14 MED ORDER — GLYCOPYRROLATE 0.2 MG/ML IJ SOLN
INTRAMUSCULAR | Status: DC | PRN
  Administered 2018-02-14: 0.1 mg via INTRAVENOUS

## 2018-02-14 MED ORDER — ONDANSETRON HCL 4 MG/2ML IJ SOLN
INTRAMUSCULAR | Status: DC | PRN
  Administered 2018-02-14: 4 mg via INTRAVENOUS

## 2018-02-14 MED ORDER — DIPHENHYDRAMINE HCL 12.5 MG/5ML PO ELIX: 12.5000 mg | ORAL_SOLUTION | ORAL | Status: DC | PRN

## 2018-02-14 MED ORDER — ONDANSETRON HCL 4 MG/2ML IJ SOLN
INTRAMUSCULAR | Status: AC
  Filled 2018-02-14: qty 2

## 2018-02-14 MED ORDER — TRANEXAMIC ACID-NACL 1000-0.7 MG/100ML-% IV SOLN
1000.0000 mg | INTRAVENOUS | Status: AC
  Administered 2018-02-14: 1000 mg via INTRAVENOUS
  Filled 2018-02-14: qty 100

## 2018-02-14 MED ORDER — PHENOL 1.4 % MT LIQD: 1.0000 | OROMUCOSAL | Status: DC | PRN

## 2018-02-14 MED ORDER — CEFAZOLIN SODIUM-DEXTROSE 2-4 GM/100ML-% IV SOLN
2.0000 g | INTRAVENOUS | Status: AC
  Administered 2018-02-14: 2 g via INTRAVENOUS
  Filled 2018-02-14: qty 100

## 2018-02-14 MED ORDER — BUPIVACAINE HCL (PF) 0.25 % IJ SOLN
INTRAMUSCULAR | Status: DC | PRN
  Administered 2018-02-14: 30 mL

## 2018-02-14 MED ORDER — CHLORHEXIDINE GLUCONATE 4 % EX LIQD: 60.0000 mL | Freq: Once | CUTANEOUS | Status: DC

## 2018-02-14 MED ORDER — MIDAZOLAM HCL 2 MG/2ML IJ SOLN
INTRAMUSCULAR | Status: AC
  Filled 2018-02-14: qty 2

## 2018-02-14 MED ORDER — SODIUM CHLORIDE 0.9 % IJ SOLN
INTRAMUSCULAR | Status: DC | PRN
  Administered 2018-02-14: 30 mL

## 2018-02-14 MED ORDER — DEXAMETHASONE SODIUM PHOSPHATE 10 MG/ML IJ SOLN
INTRAMUSCULAR | Status: AC
  Filled 2018-02-14: qty 1

## 2018-02-14 MED ORDER — ASPIRIN 81 MG PO CHEW
81.0000 mg | CHEWABLE_TABLET | Freq: Two times a day (BID) | ORAL | Status: DC
  Administered 2018-02-14 – 2018-02-17 (×6): 81 mg via ORAL
  Filled 2018-02-14 (×6): qty 1

## 2018-02-14 MED ORDER — ISOPROPYL ALCOHOL 70 % SOLN
Status: DC | PRN
  Administered 2018-02-14: 1 via TOPICAL

## 2018-02-14 MED ORDER — ESCITALOPRAM OXALATE 10 MG PO TABS
10.0000 mg | ORAL_TABLET | Freq: Every day | ORAL | Status: DC
  Administered 2018-02-14 – 2018-02-17 (×4): 10 mg via ORAL
  Filled 2018-02-14 (×4): qty 1

## 2018-02-14 MED ORDER — FENTANYL CITRATE (PF) 100 MCG/2ML IJ SOLN
INTRAMUSCULAR | Status: DC | PRN
  Administered 2018-02-14: 100 ug via INTRAVENOUS
  Administered 2018-02-14 (×4): 50 ug via INTRAVENOUS

## 2018-02-14 MED ORDER — SUGAMMADEX SODIUM 200 MG/2ML IV SOLN
INTRAVENOUS | Status: AC
  Filled 2018-02-14: qty 2

## 2018-02-14 MED ORDER — STERILE WATER FOR IRRIGATION IR SOLN
Status: DC | PRN
  Administered 2018-02-14: 3000 mL

## 2018-02-14 MED ORDER — METHOCARBAMOL 500 MG IVPB - SIMPLE MED
500.0000 mg | Freq: Four times a day (QID) | INTRAVENOUS | Status: DC | PRN
  Administered 2018-02-14: 500 mg via INTRAVENOUS
  Filled 2018-02-14: qty 50

## 2018-02-14 MED ORDER — PROPOFOL 10 MG/ML IV BOLUS
INTRAVENOUS | Status: AC
  Filled 2018-02-14: qty 20

## 2018-02-14 MED ORDER — BUPIVACAINE HCL (PF) 0.25 % IJ SOLN
INTRAMUSCULAR | Status: AC
  Filled 2018-02-14: qty 30

## 2018-02-14 MED ORDER — PROPOFOL 10 MG/ML IV BOLUS
INTRAVENOUS | Status: DC | PRN
  Administered 2018-02-14: 20 mg via INTRAVENOUS
  Administered 2018-02-14: 40 mg via INTRAVENOUS
  Administered 2018-02-14: 150 mg via INTRAVENOUS

## 2018-02-14 MED ORDER — LIDOCAINE 2% (20 MG/ML) 5 ML SYRINGE
INTRAMUSCULAR | Status: AC
  Filled 2018-02-14: qty 5

## 2018-02-14 MED ORDER — DOCUSATE SODIUM 100 MG PO CAPS
100.0000 mg | ORAL_CAPSULE | Freq: Two times a day (BID) | ORAL | Status: DC
  Administered 2018-02-14 – 2018-02-17 (×6): 100 mg via ORAL
  Filled 2018-02-14 (×6): qty 1

## 2018-02-14 MED ORDER — ROCURONIUM BROMIDE 10 MG/ML (PF) SYRINGE
PREFILLED_SYRINGE | INTRAVENOUS | Status: AC
  Filled 2018-02-14: qty 10

## 2018-02-14 MED ORDER — SODIUM CHLORIDE 0.9 % IR SOLN
Status: DC | PRN
  Administered 2018-02-14: 1000 mL

## 2018-02-14 MED ORDER — METHOCARBAMOL 500 MG PO TABS
500.0000 mg | ORAL_TABLET | Freq: Four times a day (QID) | ORAL | Status: DC | PRN
  Administered 2018-02-15 – 2018-02-17 (×7): 500 mg via ORAL
  Filled 2018-02-14 (×7): qty 1

## 2018-02-14 MED ORDER — PRAVASTATIN SODIUM 20 MG PO TABS
20.0000 mg | ORAL_TABLET | ORAL | Status: DC
  Administered 2018-02-14 – 2018-02-17 (×2): 20 mg via ORAL
  Filled 2018-02-14: qty 1

## 2018-02-14 MED ORDER — METHOCARBAMOL 500 MG IVPB - SIMPLE MED
INTRAVENOUS | Status: AC
  Filled 2018-02-14: qty 50

## 2018-02-14 MED ORDER — ACETAMINOPHEN 325 MG PO TABS
325.0000 mg | ORAL_TABLET | Freq: Four times a day (QID) | ORAL | Status: DC | PRN
  Administered 2018-02-17: 325 mg via ORAL
  Filled 2018-02-14: qty 1

## 2018-02-14 MED ORDER — SODIUM CHLORIDE 0.9 % IV SOLN
INTRAVENOUS | Status: DC
  Administered 2018-02-14 (×2): via INTRAVENOUS

## 2018-02-14 MED ORDER — ONDANSETRON HCL 4 MG PO TABS: 4.0000 mg | ORAL_TABLET | Freq: Four times a day (QID) | ORAL | Status: DC | PRN

## 2018-02-14 MED ORDER — EPHEDRINE 5 MG/ML INJ
INTRAVENOUS | Status: AC
  Filled 2018-02-14: qty 10

## 2018-02-14 MED ORDER — HYDROMORPHONE HCL 1 MG/ML IJ SOLN
INTRAMUSCULAR | Status: AC
  Filled 2018-02-14: qty 2

## 2018-02-14 MED ORDER — SODIUM CHLORIDE 0.9 % IJ SOLN
INTRAMUSCULAR | Status: AC
  Filled 2018-02-14: qty 50

## 2018-02-14 MED ORDER — EPHEDRINE SULFATE 50 MG/ML IJ SOLN
INTRAMUSCULAR | Status: DC | PRN
  Administered 2018-02-14: 5 mg via INTRAVENOUS

## 2018-02-14 MED ORDER — LACTATED RINGERS IV SOLN
INTRAVENOUS | Status: DC
  Administered 2018-02-14 (×2): via INTRAVENOUS

## 2018-02-14 MED ORDER — HYDROCODONE-ACETAMINOPHEN 5-325 MG PO TABS
1.0000 | ORAL_TABLET | ORAL | Status: DC | PRN
  Administered 2018-02-14 – 2018-02-17 (×9): 2 via ORAL
  Administered 2018-02-17: 1 via ORAL
  Filled 2018-02-14 (×5): qty 2
  Filled 2018-02-14: qty 1
  Filled 2018-02-14 (×4): qty 2

## 2018-02-14 MED ORDER — HYDROCODONE-ACETAMINOPHEN 7.5-325 MG PO TABS
1.0000 | ORAL_TABLET | ORAL | Status: DC | PRN
  Administered 2018-02-15 – 2018-02-16 (×5): 2 via ORAL
  Filled 2018-02-14 (×5): qty 2

## 2018-02-14 MED ORDER — DEXAMETHASONE SODIUM PHOSPHATE 10 MG/ML IJ SOLN
INTRAMUSCULAR | Status: DC | PRN
  Administered 2018-02-14: 6 mg via INTRAVENOUS

## 2018-02-14 MED ORDER — METOCLOPRAMIDE HCL 5 MG PO TABS: 5.0000 mg | ORAL_TABLET | Freq: Three times a day (TID) | ORAL | Status: DC | PRN

## 2018-02-14 MED ORDER — FENTANYL CITRATE (PF) 100 MCG/2ML IJ SOLN
25.0000 ug | INTRAMUSCULAR | Status: DC | PRN
  Administered 2018-02-14 (×2): 50 ug via INTRAVENOUS
  Administered 2018-02-14 (×2): 25 ug via INTRAVENOUS

## 2018-02-14 MED ORDER — ALUM & MAG HYDROXIDE-SIMETH 200-200-20 MG/5ML PO SUSP: 30.0000 mL | ORAL | Status: DC | PRN

## 2018-02-14 MED ORDER — KETOROLAC TROMETHAMINE 30 MG/ML IJ SOLN
INTRAMUSCULAR | Status: AC
  Filled 2018-02-14: qty 1

## 2018-02-14 MED ORDER — HYDROMORPHONE HCL 1 MG/ML IJ SOLN
0.2500 mg | INTRAMUSCULAR | Status: DC | PRN
  Administered 2018-02-14 (×4): 0.5 mg via INTRAVENOUS

## 2018-02-14 MED ORDER — LISINOPRIL 20 MG PO TABS
20.0000 mg | ORAL_TABLET | Freq: Every day | ORAL | Status: DC
  Administered 2018-02-14 – 2018-02-17 (×4): 20 mg via ORAL
  Filled 2018-02-14 (×4): qty 1

## 2018-02-14 MED ORDER — POLYETHYLENE GLYCOL 3350 17 G PO PACK: 17.0000 g | PACK | Freq: Every day | ORAL | Status: DC | PRN

## 2018-02-14 MED ORDER — GLYCOPYRROLATE PF 0.2 MG/ML IJ SOSY
PREFILLED_SYRINGE | INTRAMUSCULAR | Status: AC
  Filled 2018-02-14: qty 1

## 2018-02-14 MED ORDER — FENTANYL CITRATE (PF) 100 MCG/2ML IJ SOLN
INTRAMUSCULAR | Status: AC
  Filled 2018-02-14: qty 4

## 2018-02-14 SURGICAL SUPPLY — 63 items
BANDAGE ACE 4X5 VEL STRL LF (GAUZE/BANDAGES/DRESSINGS) ×3 IMPLANT
BANDAGE ACE 6X5 VEL STRL LF (GAUZE/BANDAGES/DRESSINGS) ×3 IMPLANT
BATTERY INSTRU NAVIGATION (MISCELLANEOUS) ×9 IMPLANT
BLADE SAW RECIPROCATING 77.5 (BLADE) ×3 IMPLANT
CHLORAPREP W/TINT 26ML (MISCELLANEOUS) ×6 IMPLANT
COVER SURGICAL LIGHT HANDLE (MISCELLANEOUS) ×3 IMPLANT
COVER WAND RF STERILE (DRAPES) ×3 IMPLANT
CUFF TOURN SGL QUICK 34 (TOURNIQUET CUFF) ×2
CUFF TRNQT CYL 34X4X40X1 (TOURNIQUET CUFF) ×1 IMPLANT
DECANTER SPIKE VIAL GLASS SM (MISCELLANEOUS) ×6 IMPLANT
DERMABOND ADVANCED (GAUZE/BANDAGES/DRESSINGS) ×2
DERMABOND ADVANCED .7 DNX12 (GAUZE/BANDAGES/DRESSINGS) ×1 IMPLANT
DRAPE SHEET LG 3/4 BI-LAMINATE (DRAPES) ×6 IMPLANT
DRAPE U-SHAPE 47X51 STRL (DRAPES) ×3 IMPLANT
DRSG AQUACEL AG ADV 3.5X10 (GAUZE/BANDAGES/DRESSINGS) ×3 IMPLANT
ELECT BLADE TIP CTD 4 INCH (ELECTRODE) ×3 IMPLANT
ELECT REM PT RETURN 15FT ADLT (MISCELLANEOUS) ×3 IMPLANT
FEMORAL RETAINING CRUC KNEE #4 (Knees) ×3 IMPLANT
GAUZE SPONGE 4X4 12PLY STRL (GAUZE/BANDAGES/DRESSINGS) ×3 IMPLANT
GLOVE BIO SURGEON STRL SZ 6.5 (GLOVE) ×2 IMPLANT
GLOVE BIO SURGEON STRL SZ8.5 (GLOVE) ×6 IMPLANT
GLOVE BIO SURGEONS STRL SZ 6.5 (GLOVE) ×1
GLOVE BIOGEL M STRL SZ7.5 (GLOVE) ×3 IMPLANT
GLOVE BIOGEL PI IND STRL 6.5 (GLOVE) ×1 IMPLANT
GLOVE BIOGEL PI IND STRL 7.0 (GLOVE) ×2 IMPLANT
GLOVE BIOGEL PI IND STRL 7.5 (GLOVE) ×2 IMPLANT
GLOVE BIOGEL PI IND STRL 8.5 (GLOVE) ×1 IMPLANT
GLOVE BIOGEL PI INDICATOR 6.5 (GLOVE) ×2
GLOVE BIOGEL PI INDICATOR 7.0 (GLOVE) ×4
GLOVE BIOGEL PI INDICATOR 7.5 (GLOVE) ×4
GLOVE BIOGEL PI INDICATOR 8.5 (GLOVE) ×2
GOWN SPEC L3 XXLG W/TWL (GOWN DISPOSABLE) ×3 IMPLANT
GOWN STRL REUS W/ TWL LRG LVL3 (GOWN DISPOSABLE) ×1 IMPLANT
GOWN STRL REUS W/TWL LRG LVL3 (GOWN DISPOSABLE) ×2
GOWN STRL REUS W/TWL XL LVL3 (GOWN DISPOSABLE) ×6 IMPLANT
HANDPIECE INTERPULSE COAX TIP (DISPOSABLE) ×2
HOOD PEEL AWAY FLYTE STAYCOOL (MISCELLANEOUS) ×9 IMPLANT
INSERT TIBIAL BEARING KNEE 9MM (Knees) ×3 IMPLANT
KNEE PATELLA ASYMMETRIC 10X32 (Knees) ×3 IMPLANT
KNEE TIBIAL COMP TRI SZ4 (Knees) ×3 IMPLANT
MARKER SKIN DUAL TIP RULER LAB (MISCELLANEOUS) ×3 IMPLANT
NEEDLE SPNL 18GX3.5 QUINCKE PK (NEEDLE) ×3 IMPLANT
PACK TOTAL KNEE CUSTOM (KITS) ×3 IMPLANT
PADDING CAST COTTON 6X4 STRL (CAST SUPPLIES) ×3 IMPLANT
POSITIONER SURGICAL ARM (MISCELLANEOUS) ×3 IMPLANT
SAW OSC TIP CART 19.5X105X1.3 (SAW) ×3 IMPLANT
SEALER BIPOLAR AQUA 6.0 (INSTRUMENTS) ×3 IMPLANT
SET HNDPC FAN SPRY TIP SCT (DISPOSABLE) ×1 IMPLANT
SET PAD KNEE POSITIONER (MISCELLANEOUS) ×3 IMPLANT
SUT MNCRL AB 3-0 PS2 18 (SUTURE) ×3 IMPLANT
SUT MNCRL AB 4-0 PS2 18 (SUTURE) ×3 IMPLANT
SUT MON AB 2-0 CT1 36 (SUTURE) ×6 IMPLANT
SUT STRATAFIX PDO 1 14 VIOLET (SUTURE) ×2
SUT STRATFX PDO 1 14 VIOLET (SUTURE) ×1
SUT VIC AB 1 CTX 36 (SUTURE) ×4
SUT VIC AB 1 CTX36XBRD ANBCTR (SUTURE) ×2 IMPLANT
SUT VIC AB 2-0 CT1 27 (SUTURE) ×2
SUT VIC AB 2-0 CT1 TAPERPNT 27 (SUTURE) ×1 IMPLANT
SUTURE STRATFX PDO 1 14 VIOLET (SUTURE) ×1 IMPLANT
SYR 50ML LL SCALE MARK (SYRINGE) ×3 IMPLANT
TRAY FOLEY CATH 14FR (SET/KITS/TRAYS/PACK) ×3 IMPLANT
WRAP KNEE MAXI GEL POST OP (GAUZE/BANDAGES/DRESSINGS) ×3 IMPLANT
YANKAUER SUCT BULB TIP 10FT TU (MISCELLANEOUS) ×3 IMPLANT

## 2018-02-14 NOTE — Discharge Instructions (Signed)
° °Dr. Cayli Escajeda °Total Joint Specialist °[facility] °[address] °, [place] [address] °[phone] ° °TOTAL KNEE REPLACEMENT POSTOPERATIVE DIRECTIONS ° ° ° °Knee Rehabilitation, Guidelines Following Surgery  °Results after knee surgery are often greatly improved when you follow the exercise, range of motion and muscle strengthening exercises prescribed by your doctor. Safety measures are also important to protect the knee from further injury. Any time any of these exercises cause you to have increased pain or swelling in your knee joint, decrease the amount until you are comfortable again and slowly increase them. If you have problems or questions, call your caregiver or physical therapist for advice.  ° °WEIGHT BEARING °Weight bearing as tolerated with assist device (walker, cane, etc) as directed, use it as long as suggested by your surgeon or therapist, typically at least 4-6 weeks. ° °HOME CARE INSTRUCTIONS  °Remove items at home which could result in a fall. This includes throw rugs or furniture in walking pathways.  °Continue medications as instructed at time of discharge. °You may have some home medications which will be placed on hold until you complete the course of blood thinner medication.  °You may start showering once you are discharged home but do not submerge the incision under water. Just pat the incision dry and apply a dry gauze dressing on daily. °Walk with walker as instructed.  °You may resume a sexual relationship in one month or when given the OK by your doctor.  °· Use walker as long as suggested by your caregivers. °· Avoid periods of inactivity such as sitting longer than an hour when not asleep. This helps prevent blood clots.  °You may put full weight on your legs and walk as much as is comfortable.  °You may return to work once you are cleared by your doctor.  °Do not drive a car for 6 weeks or until released by you surgeon.  °· Do not drive  while taking narcotics.  °Wear the elastic stockings for three weeks following surgery during the day but you may remove then at night. °Make sure you keep all of your appointments after your operation with all of your doctors and caregivers. You should call the office at the above phone number and make an appointment for approximately two weeks after the date of your surgery. °Do not remove your surgical dressing. The dressing is waterproof; you may take showers in 3 days, but do not take tub baths or submerge the dressing. °Please pick up a stool softener and laxative for home use as long as you are requiring pain medications. °· ICE to the affected knee every three hours for 30 minutes at a time and then as needed for pain and swelling.  Continue to use ice on the knee for pain and swelling from surgery. You may notice swelling that will progress down to the foot and ankle.  This is normal after surgery.  Elevate the leg when you are not up walking on it.   °It is important for you to complete the blood thinner medication as prescribed by your doctor. °· Continue to use the breathing machine which will help keep your temperature down.  It is common for your temperature to cycle up and down following surgery, especially at night when you are not up moving around and exerting yourself.  The breathing machine keeps your lungs expanded and your temperature down. ° °RANGE OF MOTION AND STRENGTHENING EXERCISES  °Rehabilitation of the knee is important following   a knee injury or an operation. After just a few days of immobilization, the muscles of the thigh which control the knee become weakened and shrink (atrophy). Knee exercises are designed to build up the tone and strength of the thigh muscles and to improve knee motion. Often times heat used for twenty to thirty minutes before working out will loosen up your tissues and help with improving the range of motion but do not use heat for the first two weeks following  surgery. These exercises can be done on a training (exercise) mat, on the floor, on a table or on a bed. Use what ever works the best and is most comfortable for you Knee exercises include:  °Leg Lifts - While your knee is still immobilized in a splint or cast, you can do straight leg raises. Lift the leg to 60 degrees, hold for 3 sec, and slowly lower the leg. Repeat 10-20 times 2-3 times daily. Perform this exercise against resistance later as your knee gets better.  °Quad and Hamstring Sets - Tighten up the muscle on the front of the thigh (Quad) and hold for 5-10 sec. Repeat this 10-20 times hourly. Hamstring sets are done by pushing the foot backward against an object and holding for 5-10 sec. Repeat as with quad sets.  °A rehabilitation program following serious knee injuries can speed recovery and prevent re-injury in the future due to weakened muscles. Contact your doctor or a physical therapist for more information on knee rehabilitation.  ° °SKILLED REHAB INSTRUCTIONS: °If the patient is transferred to a skilled rehab facility following release from the hospital, a list of the current medications will be sent to the facility for the patient to continue.  When discharged from the skilled rehab facility, please have the facility set up the patient's Home Health Physical Therapy prior to being released. Also, the skilled facility will be responsible for providing the patient with their medications at time of release from the facility to include their pain medication, the muscle relaxants, and their blood thinner medication. If the patient is still at the rehab facility at time of the two week follow up appointment, the skilled rehab facility will also need to assist the patient in arranging follow up appointment in our office and any transportation needs. ° °MAKE SURE YOU:  °Understand these instructions.  °Will watch your condition.  °Will get help right away if you are not doing well or get worse.   ° ° °Pick up stool softner and laxative for home use following surgery while on pain medications. °Do NOT remove your dressing. You may shower.  °Do not take tub baths or submerge incision under water. °May shower starting three days after surgery. °Please use a clean towel to pat the incision dry following showers. °Continue to use ice for pain and swelling after surgery. °Do not use any lotions or creams on the incision until instructed by your surgeon. ° °

## 2018-02-14 NOTE — Anesthesia Procedure Notes (Signed)
Procedure Name: Intubation Date/Time: 02/14/2018 7:47 AM Performed by: Jonna Munro, CRNA Pre-anesthesia Checklist: Patient identified, Emergency Drugs available, Suction available, Patient being monitored and Timeout performed Patient Re-evaluated:Patient Re-evaluated prior to induction Oxygen Delivery Method: Circle system utilized Preoxygenation: Pre-oxygenation with 100% oxygen Induction Type: IV induction Laryngoscope Size: Mac and 3 Grade View: Grade I Tube type: Oral Tube size: 7.0 mm Number of attempts: 1 Airway Equipment and Method: Stylet Placement Confirmation: ETT inserted through vocal cords under direct vision,  positive ETCO2 and breath sounds checked- equal and bilateral Secured at: 22 cm Tube secured with: Tape Dental Injury: Teeth and Oropharynx as per pre-operative assessment

## 2018-02-14 NOTE — Transfer of Care (Signed)
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  Last Pain:  Vitals:   02/14/18 0557  TempSrc: Oral         Complications: No apparent anesthesia complications

## 2018-02-14 NOTE — Evaluation (Signed)
Physical Therapy Evaluation Patient Details Name: Cheryl Lane MRN: [id number] DOB: 01/27/1951 Today's Date: 02/14/2018   History of Present Illness  Pt s/p L TKR and with hx of CKD, Reverse L TSR, R TKR and cervical radiculopathy  Clinical Impression  Pt s/p L TKR and presents with decreased L LE strength/ROM and post op pain limiting functional mobility.  Pt should progress to dc home with family assist and plans to start OP PT on Monday 02/18/18.    Follow Up Recommendations Follow surgeon's recommendation for DC plan and follow-up therapies    Equipment Recommendations  None recommended by PT    Recommendations for Other Services       Precautions / Restrictions Precautions Precautions: Knee;Fall Restrictions Weight Bearing Restrictions: No LLE Weight Bearing: Weight bearing as tolerated      Mobility  Bed Mobility Overal bed mobility: Needs Assistance Bed Mobility: Supine to Sit     Supine to sit: HOB elevated;+2 for physical assistance;Min assist     General bed mobility comments: cues for sequence and use of R LE to self assist`  Transfers Overall transfer level: Needs assistance Equipment used: Rolling walker (2 wheeled) Transfers: Sit to/from Stand Sit to Stand: Min assist;+2 safety/equipment;+2 physical assistance;From elevated surface         General transfer comment: cues for LE management and use of UEs to self assist  Ambulation/Gait Ambulation/Gait assistance: Min assist;+2 safety/equipment Gait Distance (Feet): 39 Feet Assistive device: Rolling walker (2 wheeled) Gait Pattern/deviations: Step-to pattern;Decreased step length - right;Decreased step length - left;Shuffle;Trunk flexed Gait velocity: cues for sequence, posture and position from [employer] Mobility    Modified Rankin (Stroke Patients Only)       Balance Overall balance assessment: Mild deficits observed, not formally tested                                            Pertinent Vitals/Pain Pain Assessment: 0-10 Pain Score: 4  Pain Location: L knee Pain Descriptors / Indicators: Aching;Sore Pain Intervention(s): Monitored during session;Limited activity within patient's tolerance;Premedicated before session;Ice applied    Home Living Family/patient expects to be discharged to:: Private residence Living Arrangements: Children Available Help at Discharge: Family;Home health;Personal care attendant;Available 24 hours/day Type of Home: House Home Access: Stairs to enter Entrance Stairs-Rails: Right Entrance Stairs-Number of Steps: 3 Home Layout: Able to live on main level with bedroom/bathroom Home Equipment: Walker - 4 wheels;Cane - single point;Walker - 2 wheels;Hospital bed;Crutches      Prior Function Level of Independence: Needs assistance   Gait / Transfers Assistance Needed: used cane indoors most of time and rollator on bad days.  Used rollator outdoors and community.  ADL's / Homemaking Assistance Needed: assist of family as needed  Comments: Pt lives with dtr     Hand Dominance   Dominant Hand: Right    Extremity/Trunk Assessment   Upper Extremity Assessment Upper Extremity Assessment: Overall WFL for tasks assessed    Lower Extremity Assessment Lower Extremity Assessment: LLE deficits/detail       Communication   Communication: No difficulties  Cognition Arousal/Alertness: Awake/alert Behavior During Therapy: WFL for tasks assessed/performed Overall Cognitive Status: Within Functional Limits for tasks assessed  General Comments      Exercises Total Joint Exercises Ankle Circles/Pumps: AROM;Both;Supine;15 reps   Assessment/Plan    PT Assessment Patient needs continued PT services  PT Problem List Decreased strength;Decreased range of motion;Decreased activity tolerance;Decreased mobility;Decreased knowledge of  use of DME;Pain;Obesity       PT Treatment Interventions DME instruction;Gait training;Stair training;Functional mobility training;Therapeutic activities;Therapeutic exercise;Patient/family education    PT Goals (Current goals can be found in the Care Plan section)  Acute Rehab PT Goals Patient Stated Goal: Two knees that dont hurt PT Goal Formulation: With patient Time For Goal Achievement: 02/21/18 Potential to Achieve Goals: Good    Frequency 7X/week   Barriers to discharge        Co-evaluation               AM-PAC PT "6 Clicks" Daily Activity  Outcome Measure Difficulty turning over in bed (including adjusting bedclothes, sheets and blankets)?: Unable Difficulty moving from lying on back to sitting on the side of the bed? : Unable Difficulty sitting down on and standing up from a chair with arms (e.g., wheelchair, bedside commode, etc,.)?: Unable Help needed moving to and from a bed to chair (including a wheelchair)?: A Lot Help needed walking in hospital room?: A Little Help needed climbing 3-5 steps with a railing? : A Lot 6 Click Score: 10    End of Session Equipment Utilized During Treatment: Gait belt Activity Tolerance: Patient tolerated treatment well Patient left: in chair;with call bell/phone within reach;with chair alarm set Nurse Communication: Mobility status PT Visit Diagnosis: Muscle weakness (generalized) (M62.81);Difficulty in walking, not elsewhere classified (R26.2);Pain Pain - Right/Left: Left Pain - part of body: Knee    Time: 6767-2094 PT Time Calculation (min) (ACUTE ONLY): 32 min   Charges:   PT Evaluation $PT Eval Low Complexity: 1 Low PT Treatments $Gait Training: 8-22 mins        Debe Coder PT Acute Rehabilitation Services Pager [phone] Office [phone]   Cheryl Lane 02/14/2018, 4:19 PM

## 2018-02-14 NOTE — Anesthesia Procedure Notes (Signed)
Anesthesia Regional Block: Adductor canal block   Pre-Anesthetic Checklist: ,, timeout performed, Correct Patient, Correct Site, Correct Laterality, Correct Procedure, Correct Position, site marked, Risks and benefits discussed,  Surgical consent,  Pre-op evaluation,  At surgeon's request and post-op pain management  Laterality: Left  Prep: chloraprep       Needles:  Injection technique: Single-shot  Needle Type: Echogenic Stimulator Needle          Additional Needles:   Procedures: Doppler guided,,,, ultrasound used (permanent image in chart),,,,  Narrative:  Start time: 02/14/2018 7:00 AM End time: 02/14/2018 7:15 AM Injection made incrementally with aspirations every 5 mL.  Performed by: Personally  Anesthesiologist: Belinda Block, MD

## 2018-02-14 NOTE — Anesthesia Postprocedure Evaluation (Signed)
Anesthesia Post Note  Patient: Cheryl Lane  Procedure(s) Performed: LEFT TOTAL KNEE ARTHROPLASTY WITH COMPUTER NAVIGATION (Left Knee)     Patient location during evaluation: PACU Anesthesia Type: General Level of consciousness: awake Pain management: pain level controlled Vital Signs Assessment: post-procedure vital signs reviewed and stable Respiratory status: spontaneous breathing Cardiovascular status: stable Postop Assessment: no apparent nausea or vomiting Anesthetic complications: no    Last Vitals:  Vitals:   02/14/18 1403 02/14/18 1502  BP: 140/88 (!) 153/83  Pulse: 79 75  Resp: 16 16  Temp: (!) 36.4 C 36.6 C  SpO2: 100% 99%    Last Pain:  Vitals:   02/14/18 1502  TempSrc: Oral  PainSc:                  Nicholle Falzon

## 2018-02-14 NOTE — Op Note (Signed)
OPERATIVE REPORT  SURGEON: Rod Can, MD   ASSISTANT: Nehemiah Massed, PA-C.  PREOPERATIVE DIAGNOSIS: Left knee arthritis.   POSTOPERATIVE DIAGNOSIS: Left knee arthritis.   PROCEDURE: Left total knee arthroplasty.   IMPLANTS: Stryker Triathlon CR femur, size 4. Stryker Tritanium tibia, size 4. X3 polyethelyene insert, size 9 mm, CS. 3 button asymmetric patella, size 32 mm.  ANESTHESIA:  MAC, Regional and Spinal  TOURNIQUET TIME: Not utilized.   ESTIMATED BLOOD LOSS:-300 mL    ANTIBIOTICS: 2 g Ancef.  DRAINS: None.  COMPLICATIONS: None   CONDITION: PACU - hemodynamically stable.   BRIEF CLINICAL NOTE: Cheryl Lane is a 67 y.o. female with a long-standing history of Left knee arthritis. After failing conservative management, the patient was indicated for total knee arthroplasty. The risks, benefits, and alternatives to the procedure were explained, and the patient elected to proceed.  PROCEDURE IN DETAIL: Adductor canal block was obtained in the pre-op holding area. Once inside the operative room, spinal anesthesia was obtained, and a foley catheter was inserted. The patient was then positioned, a nonsterile tourniquet was placed, and the lower extremity was prepped and draped in the normal sterile surgical fashion. A time-out was called verifying side and site of surgery. The patient received IV antibiotics within 60 minutes of beginning the procedure. The tourniquet was not utilized.  An anterior approach to the knee was performed utilizing a midvastus arthrotomy. A medial release was performed and the patellar fat pad was excised. Stryker navigation was used to cut the distal femur perpendicular to the mechanical axis. A freehand patellar resection was performed, and the patella was sized an prepared with 3 lug holes.  Nagivation was used to make a neutral proximal  tibia resection, taking 5 mm of bone from the less affected medial side with 3 degrees of slope. The menisci were excised. A spacer block was placed, and the alignment and balance in extension were confirmed.   The distal femur was sized using the 3-degree external rotation guide referencing the posterior femoral cortex. The appropriate 4-in-1 cutting block was pinned into place. Rotation was checked using Whiteside's line, the epicondylar axis, and then confirmed with a spacer block in flexion. The remaining femoral cuts were performed, taking care to protect the MCL.  The tibia was sized and the trial tray was pinned into place. The remaining trail components were inserted. The knee was stable to varus and valgus stress through a full range of motion. The patella tracked centrally, and the PCL was well balanced. The trial components were removed, and the proximal tibial surface was prepared. Final components were impacted into place. The knee was tested for a final time and found to be well balanced.  The wound was copiously irrigated with normal saline using pulse lavage. Marcaine solution was injected into the periarticular soft tissue. The wound was closed in layers using #1 Vicryl and Stratafix for the fascia, 2-0 Vicryl for the subcutaneous fat, 2-0 Monocryl for the deep dermal layer, 3-0 running Monocryl subcuticular Stitch, and Dermabond for the skin. Once the glue was fully dried, an Aquacell Ag and compressive dressing were applied. Tthe patient was transported to the recovery room in stable condition. Sponge, needle, and instrument counts were correct at the end of the case x2. The patient tolerated the procedure well and there were no known complications.  Please note that a surgical assistant was a medical necessity for this procedure in order to perform it in a safe and expeditious manner. Surgical assistant was  necessary to retract the ligaments and vital neurovascular structures to  prevent injury to them and also necessary for proper positioning of the limb to allow for anatomic placement of the prosthesis.

## 2018-02-14 NOTE — Interval H&P Note (Signed)
History and Physical Interval Note:  02/14/2018 7:32 AM  Cheryl Lane  has presented today for surgery, with the diagnosis of Degenerative joint disease left knee  The various methods of treatment have been discussed with the patient and family. After consideration of risks, benefits and other options for treatment, the patient has consented to  Procedure(s) with comments: LEFT TOTAL KNEE ARTHROPLASTY WITH COMPUTER NAVIGATION (Left) - Needs RNFA as a surgical intervention .  The patient's history has been reviewed, patient examined, no change in status, stable for surgery.  I have reviewed the patient's chart and labs.  Questions were answered to the patient's satisfaction.     Hilton Cork Corby Villasenor

## 2018-02-15 LAB — BASIC METABOLIC PANEL
ANION GAP: 7 (ref 5–15)
BUN: 26 mg/dL — AB (ref 8–23)
CHLORIDE: 105 mmol/L (ref 98–111)
CO2: 27 mmol/L (ref 22–32)
Calcium: 8.3 mg/dL — ABNORMAL LOW (ref 8.9–10.3)
Creatinine, Ser: 1.19 mg/dL — ABNORMAL HIGH (ref 0.44–1.00)
GFR calc Af Amer: 53 mL/min — ABNORMAL LOW (ref >60)
GFR, EST NON AFRICAN AMERICAN: 45 mL/min — AB (ref >60)
GLUCOSE: 122 mg/dL — AB (ref 70–99)
POTASSIUM: 4.7 mmol/L (ref 3.5–5.1)
Sodium: 139 mmol/L (ref 135–145)

## 2018-02-15 LAB — CBC
HCT: 31.1 % — ABNORMAL LOW (ref 36.0–46.0)
Hemoglobin: 9 g/dL — ABNORMAL LOW (ref 12.0–15.0)
MCH: 26 pg (ref 26.0–34.0)
MCHC: 28.9 g/dL — AB (ref 30.0–36.0)
MCV: 89.9 fL (ref 80.0–100.0)
Platelets: 281 10*3/uL (ref 150–400)
RBC: 3.46 MIL/uL — AB (ref 3.87–5.11)
RDW: 15.2 % (ref 11.5–15.5)
WBC: 6.8 10*3/uL (ref 4.0–10.5)
nRBC: 0 % (ref 0.0–0.2)

## 2018-02-15 MED ORDER — METHOCARBAMOL 500 MG PO TABS: 500.0000 mg | ORAL_TABLET | Freq: Four times a day (QID) | ORAL | 0 refills | Status: AC | PRN

## 2018-02-15 MED ORDER — ONDANSETRON HCL 4 MG PO TABS: 4.0000 mg | ORAL_TABLET | Freq: Four times a day (QID) | ORAL | 0 refills | Status: DC | PRN

## 2018-02-15 MED ORDER — HYDROCODONE-ACETAMINOPHEN 5-325 MG PO TABS: 1.0000 | ORAL_TABLET | Freq: Four times a day (QID) | ORAL | 0 refills | Status: DC | PRN

## 2018-02-15 NOTE — Progress Notes (Signed)
Physical Therapy Treatment Patient Details Name: Cheryl Lane MRN: [id number] DOB: December 29, 1950 Today's Date: 02/15/2018    History of Present Illness Pt s/p L TKR and with hx of CKD, Reverse L TSR, R TKR and cervical radiculopathy    PT Comments    Pt requiring increased time for all activities but very cooperative and progressing steadily with mobility.   Follow Up Recommendations  Follow surgeon's recommendation for DC plan and follow-up therapies     Equipment Recommendations  None recommended by PT    Recommendations for Other Services       Precautions / Restrictions Precautions Precautions: Knee;Fall Restrictions Weight Bearing Restrictions: No LLE Weight Bearing: Weight bearing as tolerated    Mobility  Bed Mobility               General bed mobility comments: Pt up in chair and requests back to same  Transfers Overall transfer level: Needs assistance Equipment used: Rolling walker (2 wheeled) Transfers: Sit to/from Stand Sit to Stand: Min assist;Mod assist         General transfer comment: cues for LE management and use of UEs to self assist  Ambulation/Gait Ambulation/Gait assistance: Min assist Gait Distance (Feet): 39 Feet Assistive device: Rolling walker (2 wheeled) Gait Pattern/deviations: Step-to pattern;Decreased step length - right;Decreased step length - left;Shuffle;Trunk flexed Gait velocity: decr   General Gait Details: min cues for sequence, posture and position from RW.  Increased time and standing rest breaks required for task completion   Stairs             Wheelchair Mobility    Modified Rankin (Stroke Patients Only)       Balance Overall balance assessment: Mild deficits observed, not formally tested                                          Cognition Arousal/Alertness: Awake/alert Behavior During Therapy: WFL for tasks assessed/performed Overall Cognitive Status: Within Functional  Limits for tasks assessed                                        Exercises Total Joint Exercises Ankle Circles/Pumps: AROM;Both;Supine;15 reps Quad Sets: AROM;Both;10 reps;Supine Heel Slides: AAROM;Left;15 reps;Supine Straight Leg Raises: AAROM;Left;15 reps;Supine    General Comments        Pertinent Vitals/Pain Pain Assessment: 0-10 Pain Score: 6  Pain Location: L knee Pain Descriptors / Indicators: Aching;Sore Pain Intervention(s): Limited activity within patient's tolerance;Monitored during session;Premedicated before session;Ice applied    Home Living                      Prior Function            PT Goals (current goals can now be found in the care plan section) Acute Rehab PT Goals Patient Stated Goal: Two knees that dont hurt PT Goal Formulation: With patient Time For Goal Achievement: 02/21/18 Potential to Achieve Goals: Good Progress towards PT goals: Progressing toward goals    Frequency    7X/week      PT Plan Current plan remains appropriate    Co-evaluation              AM-PAC PT "6 Clicks" Daily Activity  Outcome Measure  Difficulty turning over in bed (including adjusting  bedclothes, sheets and blankets)?: Unable Difficulty moving from lying on back to sitting on the side of the bed? : Unable Difficulty sitting down on and standing up from a chair with arms (e.g., wheelchair, bedside commode, etc,.)?: Unable Help needed moving to and from a bed to chair (including a wheelchair)?: A Lot Help needed walking in hospital room?: A Little Help needed climbing 3-5 steps with a railing? : A Lot 6 Click Score: 10    End of Session Equipment Utilized During Treatment: Gait belt Activity Tolerance: Patient tolerated treatment well Patient left: in chair;with call bell/phone within reach;with chair alarm set;with family/visitor present Nurse Communication: Mobility status PT Visit Diagnosis: Muscle weakness  (generalized) (M62.81);Difficulty in walking, not elsewhere classified (R26.2);Pain Pain - Right/Left: Left Pain - part of body: Knee     Time: 8295-6213 PT Time Calculation (min) (ACUTE ONLY): 38 min  Charges:  $Gait Training: 23-37 mins $Therapeutic Exercise: 8-22 mins                     Debe Coder PT Acute Rehabilitation Services Pager [phone] Office [phone]    Jacayla Nordell 02/15/2018, 11:43 AM

## 2018-02-15 NOTE — Care Management (Signed)
DC plan:  Pt is set up for OPPT at [facility] and has DME. Marney Doctor RN,BSN [phone]

## 2018-02-15 NOTE — Discharge Summary (Signed)
Physician Discharge Summary  Patient ID: Cheryl Lane MRN: [id number] DOB/AGE: March 25, 1951 67 y.o.  Admit date: 02/14/2018 Discharge date: 02/15/2018  Admission Diagnoses:  Osteoarthritis of left knee  Discharge Diagnoses:  Principal Problem:   Osteoarthritis of left knee   Past Medical History:  Diagnosis Date  . Acute respiratory failure with hypoxia ([place]) 06/07/2016   RESOLVED   . Anemia    as a child  . Anxiety 06/07/2016  . Aortic atherosclerosis ([place])   . Arthritis   . Asthma    bronchitis  . CAP (community acquired pneumonia) 12/21/2015  . Cervical radiculopathy   . Chronic kidney disease (CKD), stage IV (severe) ([place]) 05/30/2016  . Chronic pain in left shoulder   . CKD (chronic kidney disease)    pt not aware of this  . CKD (chronic kidney disease) stage 3, GFR 30-59 ml/min ([facility])   . Depression   . Grade I diastolic dysfunction 40/98/1191   Noted on ECHO  . History of hiatal hernia 05/29/2016   Moderate, noted on CXR  . Humerus fracture 02/24/2017   Left  . Hypertension   . Hypothyroidism   . LVH (left ventricular hypertrophy) 12/23/2015   Mild, noted on ECHO  . Pneumonia   . Pre-diabetes   . PVC's (premature ventricular contractions) 12/22/2015  . 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 female who was admitted 02/14/2018 with a diagnosis of Osteoarthritis of left knee and went to the operating room on 02/14/2018 and underwent the above named procedures.    She was given perioperative antibiotics:  Anti-infectives (From admission, onward)   Start     Dose/Rate Route Frequency Ordered Stop   02/14/18 1330  ceFAZolin (ANCEF) IVPB 2g/100 mL premix     2 g 200 mL/hr over 30 Minutes Intravenous Every 6 hours 02/14/18 1159 02/14/18  2054   02/14/18 0600  ceFAZolin (ANCEF) IVPB 2g/100 mL premix     2 g 200 mL/hr over 30 Minutes Intravenous On call to O.R. 02/14/18 4782 02/14/18 0734    .  She was given sequential compression devices, early ambulation, and ASA for DVT prophylaxis.  She benefited maximally from the hospital stay and there were no complications.    Recent vital signs:  Vitals:   02/15/18 0540 02/15/18 0636  BP: 124/79 139/73  Pulse: 71 66  Resp: 19 18  Temp: 97.9 F (36.6 C) 98.1 F (36.7 C)  SpO2: 99% 100%    Recent laboratory studies:  Lab Results  Component Value Date   HGB 9.0 (L) 02/15/2018   HGB 11.6 (L) 02/06/2018   HGB 9.7 (L) 11/17/2017   Lab Results  Component Value Date   WBC 6.8 02/15/2018   PLT 281 02/15/2018   No results found for: INR Lab Results  Component Value Date   NA 139 02/15/2018   K 4.7 02/15/2018   CL 105 02/15/2018   CO2 27 02/15/2018   BUN 26 (H) 02/15/2018   CREATININE 1.19 (H) 02/15/2018   GLUCOSE 122 (H) 02/15/2018    Discharge Medications:   Allergies as of 02/15/2018   No Known Allergies     Medication List    STOP taking these medications   traMADol 50 MG tablet Commonly known as:  ULTRAM     TAKE these medications  aspirin 81 MG chewable tablet Chew 1 tablet (81 mg total) by mouth 2 (two) times daily.   docusate sodium 100 MG capsule Commonly known as:  COLACE Take 1 capsule (100 mg total) by mouth 2 (two) times daily. What changed:  when to take this   escitalopram 10 MG tablet Commonly known as:  LEXAPRO Take 10 mg daily by mouth.   furosemide 20 MG tablet Commonly known as:  LASIX Take 20 mg by mouth daily.   HYDROcodone-acetaminophen 5-325 MG tablet Commonly known as:  NORCO/VICODIN Take 1-2 tablets by mouth every 6 (six) hours as needed for moderate pain. What changed:  reasons to take this   levothyroxine 75 MCG tablet Commonly known as:  SYNTHROID, LEVOTHROID Take 75 mcg daily before breakfast by mouth.    lisinopril 10 MG tablet Commonly known as:  PRINIVIL,ZESTRIL Take 2 tablets (20 mg total) by mouth daily.   methocarbamol 500 MG tablet Commonly known as:  ROBAXIN Take 1 tablet (500 mg total) by mouth every 6 (six) hours as needed for muscle spasms. What changed:  when to take this   ondansetron 4 MG tablet Commonly known as:  ZOFRAN Take 1 tablet (4 mg total) by mouth every 6 (six) hours as needed for nausea.   pravastatin 20 MG tablet Commonly known as:  PRAVACHOL Take 20 mg by mouth 2 (two) times a week.   senna 8.6 MG Tabs tablet Commonly known as:  SENOKOT Take 1 tablet (8.6 mg total) by mouth 2 (two) times daily. What changed:    when to take this  reasons to take this       Diagnostic Studies: Dg Knee Left Port  Result Date: 02/14/2018 CLINICAL DATA:  Status post left total knee replacement. EXAM: PORTABLE LEFT KNEE - 1-2 VIEW COMPARISON:  Radiographs of February 24, 2017. FINDINGS: The femoral and tibial components appear to be well position. Patellar prosthesis also appears well position. Expected postoperative changes are noted in the soft tissues anteriorly. No fracture or dislocation is noted. IMPRESSION: Status post left total knee arthroplasty. Electronically Signed   By: Marijo Conception, M.D.   On: 02/14/2018 12:44    Disposition: Home with outpatient therapy    Follow-up Information    Swinteck, Aaron Edelman, MD. Schedule an appointment as soon as possible for a visit in 2 weeks.   Specialty:  Orthopedic Surgery Why:  For wound re-check Contact information: [address] [place] [address] [phone]            Signed: Ardeen Jourdain, PA-C 02/17/18

## 2018-02-15 NOTE — Addendum Note (Signed)
Addendum  created 02/15/18 0650 by Lollie Sails, CRNA   Charge Capture section accepted

## 2018-02-15 NOTE — Progress Notes (Signed)
Physical Therapy Treatment Patient Details Name: Cheryl Lane MRN: [id number] DOB: 04-15-1951 Today's Date: 02/15/2018    History of Present Illness Pt s/p L TKR and with hx of CKD, Reverse L TSR, R TKR and cervical radiculopathy    PT Comments    Pt continues cooperative but progressing slowly with mobility 2* pain.   Follow Up Recommendations  Follow surgeon's recommendation for DC plan and follow-up therapies     Equipment Recommendations  None recommended by PT    Recommendations for Other Services       Precautions / Restrictions Precautions Precautions: Knee;Fall Restrictions Weight Bearing Restrictions: No LLE Weight Bearing: Weight bearing as tolerated    Mobility  Bed Mobility               General bed mobility comments: Pt up in chair and requests back to same  Transfers Overall transfer level: Needs assistance Equipment used: Rolling walker (2 wheeled) Transfers: Sit to/from Stand Sit to Stand: Min assist;Mod assist         General transfer comment: cues for LE management and use of UEs to self assist  Ambulation/Gait Ambulation/Gait assistance: Min assist Gait Distance (Feet): 39 Feet Assistive device: Rolling walker (2 wheeled) Gait Pattern/deviations: Step-to pattern;Decreased step length - right;Decreased step length - left;Shuffle;Trunk flexed Gait velocity: decr   General Gait Details: min cues for sequence, posture and position from RW.  Increased time and standing rest breaks required for task completion   Stairs             Wheelchair Mobility    Modified Rankin (Stroke Patients Only)       Balance Overall balance assessment: Mild deficits observed, not formally tested                                          Cognition Arousal/Alertness: Awake/alert Behavior During Therapy: WFL for tasks assessed/performed Overall Cognitive Status: Within Functional Limits for tasks assessed                                         Exercises      General Comments        Pertinent Vitals/Pain Pain Assessment: 0-10 Pain Score: 7  Pain Location: L knee Pain Descriptors / Indicators: Aching;Sore Pain Intervention(s): Limited activity within patient's tolerance;Monitored during session;Premedicated before session;Ice applied    Home Living                      Prior Function            PT Goals (current goals can now be found in the care plan section) Acute Rehab PT Goals Patient Stated Goal: Two knees that dont hurt PT Goal Formulation: With patient Time For Goal Achievement: 02/21/18 Potential to Achieve Goals: Good Progress towards PT goals: Progressing toward goals    Frequency    7X/week      PT Plan Current plan remains appropriate    Co-evaluation              AM-PAC PT "6 Clicks" Daily Activity  Outcome Measure  Difficulty turning over in bed (including adjusting bedclothes, sheets and blankets)?: Unable Difficulty moving from lying on back to sitting on the side of the bed? : Unable Difficulty sitting  down on and standing up from a chair with arms (e.g., wheelchair, bedside commode, etc,.)?: Unable Help needed moving to and from a bed to chair (including a wheelchair)?: A Lot Help needed walking in hospital room?: A Little Help needed climbing 3-5 steps with a railing? : A Lot 6 Click Score: 10    End of Session Equipment Utilized During Treatment: Gait belt Activity Tolerance: Patient tolerated treatment well Patient left: in chair;with call bell/phone within reach;with chair alarm set;with family/visitor present Nurse Communication: Mobility status PT Visit Diagnosis: Muscle weakness (generalized) (M62.81);Difficulty in walking, not elsewhere classified (R26.2);Pain Pain - Right/Left: Left Pain - part of body: Knee     Time: 2500-3704 PT Time Calculation (min) (ACUTE ONLY): 29 min  Charges:  $Gait Training: 23-37  mins                     [place] Pager [phone] Office [phone]    Saxton Chain 02/15/2018, 4:28 PM

## 2018-02-15 NOTE — Progress Notes (Addendum)
    Subjective:  Patient reports pain as mild to moderate.  Denies N/V/CP/SOB.   Objective:   VITALS:   Vitals:   02/15/18 0130 02/15/18 0247 02/15/18 0540 02/15/18 0636  BP: 136/70 134/68 124/79 139/73  Pulse: 63 66 71 66  Resp: 17  19 18   Temp: 98.1 F (36.7 C) 98.1 F (36.7 C) 97.9 F (36.6 C) 98.1 F (36.7 C)  TempSrc: Oral Oral Oral Oral  SpO2: 99% 99% 99% 100%  Weight:      Height:        NAD ABD soft Sensation intact distally Intact pulses distally Dorsiflexion/Plantar flexion intact Incision: dressing C/D/I Compartment soft   Lab Results  Component Value Date   WBC 6.8 02/15/2018   HGB 9.0 (L) 02/15/2018   HCT 31.1 (L) 02/15/2018   MCV 89.9 02/15/2018   PLT 281 02/15/2018   BMET    Component Value Date/Time   NA 139 02/15/2018 0448   NA 142 03/26/2017   K 4.7 02/15/2018 0448   CL 105 02/15/2018 0448   CO2 27 02/15/2018 0448   GLUCOSE 122 (H) 02/15/2018 0448   BUN 26 (H) 02/15/2018 0448   BUN 32 (A) 03/26/2017   CREATININE 1.19 (H) 02/15/2018 0448   CALCIUM 8.3 (L) 02/15/2018 0448   GFRNONAA 45 (L) 02/15/2018 0448   GFRAA 53 (L) 02/15/2018 0448   Anticipated LOS equal to or greater than 2 midnights due to - Age 69 and older with one or more of the following:  - Obesity  - Expected need for hospital services (PT, OT, Nursing) required for safe  discharge  - Anticipated need for postoperative skilled nursing care or inpatient rehab  - Active co-morbidities: none OR   - Unanticipated findings during/Post Surgery: None  - Patient is a high risk of re-admission due to: None    Assessment/Plan: 1 Day Post-Op   Principal Problem:   Osteoarthritis of left knee   WBAT with walker DVT ppx: Aspirin, SCDs, TEDS PO pain control PT/OT Dispo: D/C home tomorrow with outpatient PT   Cheryl Lane 02/15/2018, 8:05 AM   Rod Can, MD Cell: [phone] [facility] is now [facility]  [place] [address], [address], [place], [place] [address] Phone: [phone] [email or web address] Facebook  [employer]

## 2018-02-16 LAB — CBC
HCT: 31.2 % — ABNORMAL LOW (ref 36.0–46.0)
HEMOGLOBIN: 9.2 g/dL — AB (ref 12.0–15.0)
MCH: 26.7 pg (ref 26.0–34.0)
MCHC: 29.5 g/dL — ABNORMAL LOW (ref 30.0–36.0)
MCV: 90.7 fL (ref 80.0–100.0)
PLATELETS: 271 10*3/uL (ref 150–400)
RBC: 3.44 MIL/uL — AB (ref 3.87–5.11)
RDW: 15.4 % (ref 11.5–15.5)
WBC: 6.8 10*3/uL (ref 4.0–10.5)
nRBC: 0 % (ref 0.0–0.2)

## 2018-02-16 NOTE — Progress Notes (Signed)
Subjective: 2 Days Post-Op Procedure(s) (LRB): LEFT TOTAL KNEE ARTHROPLASTY WITH COMPUTER NAVIGATION (Left)  Patient reports pain as moderate to severe.  Patient reports that she had a very difficult night due to pain.  She notes that she was unable to sleep b/c the pain was so bad.  States that she does not feel that she can go home.  Denies fever, chills, N/V, CP, SOB.  Able to eat a little, although she reports that she doesn't have much of an appetite due to pain.    Objective:   VITALS:  Temp:  [97.8 F (36.6 C)-100.3 F (37.9 C)] 100.3 F (37.9 C) (10/26 0538) Pulse Rate:  [70-87] 87 (10/26 0538) Resp:  [16-18] 18 (10/26 0538) BP: (107-139)/(55-64) 139/64 (10/26 0538) SpO2:  [93 %-100 %] 93 % (10/26 0538)  General: WDWN patient in NAD. Psych:  Appropriate mood and affect. Neuro:  A&O x 3, Moving all extremities, sensation intact to light touch HEENT:  EOMs intact Chest:  Even non-labored respirations Skin:  Dressing C/D/I, no rashes or lesions Extremities: warm/dry, mild edema, no erythema or echymosis.  No lymphadenopathy. Pulses: Popliteus 2+ MSK:  ROM: lacks 5 degrees TKE L knee, MMT: able to perform quad set, (-) Homan's    LABS Recent Labs    02/15/18 0448 02/16/18 0321  HGB 9.0* 9.2*  WBC 6.8 6.8  PLT 281 271   Recent Labs    02/15/18 0448  NA 139  K 4.7  CL 105  CO2 27  BUN 26*  CREATININE 1.19*  GLUCOSE 122*   No results for input(s): LABPT, INR in the last 72 hours.   Assessment/Plan: 2 Days Post-Op Procedure(s) (LRB): LEFT TOTAL KNEE ARTHROPLASTY WITH COMPUTER NAVIGATION (Left)  Patient seen in rounds for Dr. Lyla Glassing Up with therapy WBAT L LE Original plan was for patient to go home today.  She states that her family couldn't get her until 9pm.  She is going to see if her pain improves as the day progresses.  If she improves please call and I will be happy to place the D/C order.  Otherwise we will plan for the patient to stay one more  night. Scripts on chart. Plan for outpatient post-op visit with Dr. Lyla Glassing.   Mechele Claude PA-C [facility] Office:  [phone]

## 2018-02-16 NOTE — Progress Notes (Signed)
Physical Therapy Treatment Patient Details Name: Cheryl Lane MRN: [id number] DOB: July 04, 1950 Today's Date: 02/16/2018    History of Present Illness Pt s/p L TKR and with hx of CKD, Reverse L TSR, R TKR and cervical radiculopathy    PT Comments    Pt continues cooperative with PT but limited this date by increased pain at rest and with all activity.   Follow Up Recommendations  Follow surgeon's recommendation for DC plan and follow-up therapies     Equipment Recommendations  None recommended by PT    Recommendations for Other Services       Precautions / Restrictions Precautions Precautions: Knee;Fall Restrictions Weight Bearing Restrictions: No LLE Weight Bearing: Weight bearing as tolerated    Mobility  Bed Mobility                  Transfers Overall transfer level: Needs assistance Equipment used: Rolling walker (2 wheeled) Transfers: Sit to/from Stand Sit to Stand: Min assist;Mod assist         General transfer comment: cues for LE management and use of UEs to self assist  Ambulation/Gait Ambulation/Gait assistance: Min assist Gait Distance (Feet): 26 Feet Assistive device: Rolling walker (2 wheeled) Gait Pattern/deviations: Step-to pattern;Decreased step length - right;Decreased step length - left;Shuffle;Trunk flexed Gait velocity: decr   General Gait Details: min cues for sequence, posture and position from RW.  Increased time and standing rest breaks required for task completion   Stairs             Wheelchair Mobility    Modified Rankin (Stroke Patients Only)       Balance Overall balance assessment: Mild deficits observed, not formally tested                                          Cognition Arousal/Alertness: Awake/alert Behavior During Therapy: WFL for tasks assessed/performed Overall Cognitive Status: Within Functional Limits for tasks assessed                                         Exercises      General Comments        Pertinent Vitals/Pain Pain Assessment: 0-10 Pain Score: 10-Worst pain ever Pain Location: L knee Pain Descriptors / Indicators: Aching;Sore Pain Intervention(s): Limited activity within patient's tolerance;Monitored during session;Premedicated before session;Ice applied    Home Living                      Prior Function            PT Goals (current goals can now be found in the care plan section) Acute Rehab PT Goals Patient Stated Goal: Two knees that dont hurt PT Goal Formulation: With patient Time For Goal Achievement: 02/21/18 Potential to Achieve Goals: Good Progress towards PT goals: Not progressing toward goals - comment(pain limited)    Frequency    7X/week      PT Plan Current plan remains appropriate    Co-evaluation              AM-PAC PT "6 Clicks" Daily Activity  Outcome Measure  Difficulty turning over in bed (including adjusting bedclothes, sheets and blankets)?: Unable Difficulty moving from lying on back to sitting on the side of the bed? : Unable  Difficulty sitting down on and standing up from a chair with arms (e.g., wheelchair, bedside commode, etc,.)?: Unable Help needed moving to and from a bed to chair (including a wheelchair)?: A Lot Help needed walking in hospital room?: A Little Help needed climbing 3-5 steps with a railing? : A Lot 6 Click Score: 10    End of Session Equipment Utilized During Treatment: Gait belt Activity Tolerance: Patient tolerated treatment well Patient left: in chair;with call bell/phone within reach;with chair alarm set;with family/visitor present Nurse Communication: Mobility status PT Visit Diagnosis: Muscle weakness (generalized) (M62.81);Difficulty in walking, not elsewhere classified (R26.2);Pain Pain - Right/Left: Left Pain - part of body: Knee     Time: 1203-1221 PT Time Calculation (min) (ACUTE ONLY): 18 min  Charges:  $Gait Training:  8-22 mins                     [place] Pager [phone] Office [phone]    Avonlea Sima 02/16/2018, 12:49 PM

## 2018-02-16 NOTE — Progress Notes (Signed)
Physical Therapy Treatment Patient Details Name: Cheryl Lane MRN: [id number] DOB: 09-24-1950 Today's Date: 02/16/2018    History of Present Illness Pt s/p L TKR and with hx of CKD, Reverse L TSR, R TKR and cervical radiculopathy    PT Comments    POD # 2 pm session Performed TKR TE's only this afternoon.  Pt required rest breaks and AAROM for knee flex limited to approx 30 degrees due to pain level.    Follow Up Recommendations  Follow surgeon's recommendation for DC plan and follow-up therapies     Equipment Recommendations  None recommended by PT    Recommendations for Other Services       Precautions / Restrictions Precautions Precautions: Knee;Fall Restrictions Weight Bearing Restrictions: No LLE Weight Bearing: Weight bearing as tolerated       Balance                                            Cognition Arousal/Alertness: Awake/alert Behavior During Therapy: WFL for tasks assessed/performed Overall Cognitive Status: Within Functional Limits for tasks assessed                                        Exercises      General Comments        Pertinent Vitals/Pain Pain Assessment: 0-10 Pain Score: 10-Worst pain ever Pain Location: L knee Pain Descriptors / Indicators: Aching;Sore;Operative site guarding Pain Intervention(s): Monitored during session;Repositioned;Ice applied;Premedicated before session    Home Living                      Prior Function            PT Goals (current goals can now be found in the care plan section) Progress towards PT goals: Progressing toward goals    Frequency    7X/week      PT Plan Current plan remains appropriate    Co-evaluation              AM-PAC PT "6 Clicks" Daily Activity  Outcome Measure  Difficulty turning over in bed (including adjusting bedclothes, sheets and blankets)?: Unable Difficulty moving from lying on back to sitting on the side of  the bed? : Unable Difficulty sitting down on and standing up from a chair with arms (e.g., wheelchair, bedside commode, etc,.)?: Unable Help needed moving to and from a bed to chair (including a wheelchair)?: Total Help needed walking in hospital room?: Total Help needed climbing 3-5 steps with a railing? : Total 6 Click Score: 6    End of Session   Activity Tolerance: Patient limited by pain Patient left: in chair;with call bell/phone within reach;with chair alarm set;with family/visitor present   PT Visit Diagnosis: Muscle weakness (generalized) (M62.81);Difficulty in walking, not elsewhere classified (R26.2);Pain Pain - part of body: Knee     Time: 1346-1400 PT Time Calculation (min) (ACUTE ONLY): 14 min  Charges:  $Therapeutic Exercise: 8-22 mins                     Rica Koyanagi  PTA Acute  Rehabilitation Services Pager      [phone] Office      [phone]

## 2018-02-17 DIAGNOSIS — Z23 Encounter for immunization: Secondary | ICD-10-CM | POA: Diagnosis not present

## 2018-02-17 LAB — CBC
HCT: 26.9 % — ABNORMAL LOW (ref 36.0–46.0)
Hemoglobin: 7.9 g/dL — ABNORMAL LOW (ref 12.0–15.0)
MCH: 26.2 pg (ref 26.0–34.0)
MCHC: 29.4 g/dL — ABNORMAL LOW (ref 30.0–36.0)
MCV: 89.1 fL (ref 80.0–100.0)
Platelets: 235 10*3/uL (ref 150–400)
RBC: 3.02 MIL/uL — ABNORMAL LOW (ref 3.87–5.11)
RDW: 15.9 % — ABNORMAL HIGH (ref 11.5–15.5)
WBC: 6 10*3/uL (ref 4.0–10.5)
nRBC: 0 % (ref 0.0–0.2)

## 2018-02-17 NOTE — Plan of Care (Signed)
  Problem: Health Behavior/Discharge Planning: Goal: Ability to manage health-related needs will improve Outcome: Progressing   Problem: Clinical Measurements: Goal: Ability to maintain clinical measurements within normal limits will improve Outcome: Progressing Goal: Will remain free from infection Outcome: Progressing Goal: Diagnostic test results will improve Outcome: Progressing Goal: Respiratory complications will improve Outcome: Progressing Goal: Cardiovascular complication will be avoided Outcome: Progressing   Problem: Activity: Goal: Risk for activity intolerance will decrease Outcome: Progressing   Problem: Elimination: Goal: Will not experience complications related to bowel motility Outcome: Progressing Goal: Will not experience complications related to urinary retention Outcome: Progressing   Problem: Pain Managment: Goal: General experience of comfort will improve Outcome: Progressing   Problem: Safety: Goal: Ability to remain free from injury will improve Outcome: Progressing   Problem: Activity: Goal: Ability to avoid complications of mobility impairment will improve Outcome: Progressing Goal: Range of joint motion will improve Outcome: Progressing   Problem: Clinical Measurements: Goal: Postoperative complications will be avoided or minimized Outcome: Progressing   Problem: Pain Management: Goal: Pain level will decrease with appropriate interventions Outcome: Progressing

## 2018-02-17 NOTE — Progress Notes (Signed)
Discharged from floor via w/c for transport home by car. Belongings & family with pt. No changes in assessment. Cheryl Lane  

## 2018-02-17 NOTE — Progress Notes (Signed)
Physical Therapy Treatment Patient Details Name: Cheryl Lane MRN: [id number] DOB: 18-May-1950 Today's Date: 02/17/2018    History of Present Illness Pt s/p L TKR and with hx of CKD, Reverse L TSR, R TKR and cervical radiculopathy    PT Comments    Pt with improved pain control and in better spirits this am but continues to fatigue easily.  Pt performed therex program this am and ambulated short distance in hall but too fatigued to attempt stairs.   Follow Up Recommendations  Follow surgeon's recommendation for DC plan and follow-up therapies     Equipment Recommendations  None recommended by PT    Recommendations for Other Services       Precautions / Restrictions Precautions Precautions: Knee;Fall Restrictions Weight Bearing Restrictions: No LLE Weight Bearing: Weight bearing as tolerated    Mobility  Bed Mobility               General bed mobility comments: Pt up in chair and requests back to same  Transfers Overall transfer level: Needs assistance Equipment used: Rolling walker (2 wheeled) Transfers: Sit to/from Stand Sit to Stand: Min assist;Mod assist         General transfer comment: cues for LE management and use of UEs to self assist  Ambulation/Gait Ambulation/Gait assistance: Min assist;Min guard Gait Distance (Feet): 30 Feet Assistive device: Rolling walker (2 wheeled) Gait Pattern/deviations: Step-to pattern;Decreased step length - right;Decreased step length - left;Shuffle;Trunk flexed Gait velocity: decr   General Gait Details: min cues for sequence, posture and position from RW.  Increased time and standing rest breaks required for task completion   Stairs             Wheelchair Mobility    Modified Rankin (Stroke Patients Only)       Balance Overall balance assessment: Mild deficits observed, not formally tested                                          Cognition Arousal/Alertness:  Awake/alert Behavior During Therapy: WFL for tasks assessed/performed Overall Cognitive Status: Within Functional Limits for tasks assessed                                        Exercises Total Joint Exercises Ankle Circles/Pumps: AROM;Both;Supine;15 reps Quad Sets: AROM;Both;10 reps;Supine Heel Slides: AAROM;Left;Supine;20 reps Straight Leg Raises: AAROM;Left;Supine;20 reps    General Comments        Pertinent Vitals/Pain Pain Assessment: 0-10 Pain Score: 6  Pain Location: L knee Pain Descriptors / Indicators: Aching;Sore;Operative site guarding Pain Intervention(s): Limited activity within patient's tolerance;Monitored during session;Premedicated before session;Ice applied    Home Living                      Prior Function            PT Goals (current goals can now be found in the care plan section) Acute Rehab PT Goals Patient Stated Goal: Two knees that dont hurt PT Goal Formulation: With patient Time For Goal Achievement: 02/21/18 Potential to Achieve Goals: Good Progress towards PT goals: Progressing toward goals    Frequency    7X/week      PT Plan Current plan remains appropriate    Co-evaluation  AM-PAC PT "6 Clicks" Daily Activity  Outcome Measure  Difficulty turning over in bed (including adjusting bedclothes, sheets and blankets)?: Unable Difficulty moving from lying on back to sitting on the side of the bed? : Unable Difficulty sitting down on and standing up from a chair with arms (e.g., wheelchair, bedside commode, etc,.)?: Unable Help needed moving to and from a bed to chair (including a wheelchair)?: A Lot Help needed walking in hospital room?: A Little Help needed climbing 3-5 steps with a railing? : A Lot 6 Click Score: 10    End of Session Equipment Utilized During Treatment: Gait belt Activity Tolerance: Patient limited by fatigue Patient left: in chair;with call bell/phone within  reach Nurse Communication: Mobility status PT Visit Diagnosis: Muscle weakness (generalized) (M62.81);Difficulty in walking, not elsewhere classified (R26.2);Pain Pain - Right/Left: Left Pain - part of body: Knee     Time: 6468-0321 PT Time Calculation (min) (ACUTE ONLY): 36 min  Charges:  $Gait Training: 8-22 mins $Therapeutic Exercise: 8-22 mins                     [place] Pager [phone] Office [phone]    Emiyah Spraggins 02/17/2018, 1:10 PM

## 2018-02-17 NOTE — Progress Notes (Signed)
   Subjective: 3 Days Post-Op Procedure(s) (LRB): LEFT TOTAL KNEE ARTHROPLASTY WITH COMPUTER NAVIGATION (Left) Patient reports pain as moderate.   Patient seen in rounds for Dr. Lyla Glassing Patient is well, and has had no acute complaints or problems other than discomfort in her left knee. She denies SOB and chest pain. No issues overnight. Voiding well. Reports flatus.  Plan is to go Home after hospital stay.  Objective: Vital signs in last 24 hours: Temp:  [98.7 F (37.1 C)-99.3 F (37.4 C)] 99.3 F (37.4 C) (10/27 0607) Pulse Rate:  [73-90] 90 (10/27 0607) Resp:  [16] 16 (10/27 0607) BP: (108-138)/(55-68) 138/68 (10/27 0607) SpO2:  [94 %-97 %] 95 % (10/27 0607)  Intake/Output from previous day:  Intake/Output Summary (Last 24 hours) at 02/17/2018 0738 Last data filed at 02/17/2018 0600 Gross per 24 hour  Intake 1240 ml  Output 800 ml  Net 440 ml     Labs: Recent Labs    02/15/18 0448 02/16/18 0321 02/17/18 0401  HGB 9.0* 9.2* 7.9*   Recent Labs    02/16/18 0321 02/17/18 0401  WBC 6.8 6.0  RBC 3.44* 3.02*  HCT 31.2* 26.9*  PLT 271 235   Recent Labs    02/15/18 0448  NA 139  K 4.7  CL 105  CO2 27  BUN 26*  CREATININE 1.19*  GLUCOSE 122*  CALCIUM 8.3*    EXAM General - Patient is Alert and Oriented Extremity - Neurologically intact Intact pulses distally Dorsiflexion/Plantar flexion intact No cellulitis present Compartment soft Dressing/Incision - clean, dry, no drainage. Aquacel in place Motor Function - intact, moving foot and toes well on exam.   Past Medical History:  Diagnosis Date  . Acute respiratory failure with hypoxia ([place]) 06/07/2016   RESOLVED   . Anemia    as a child  . Anxiety 06/07/2016  . Aortic atherosclerosis ([place])   . Arthritis   . Asthma    bronchitis  . CAP (community acquired pneumonia) 12/21/2015  . Cervical radiculopathy   . Chronic kidney disease (CKD), stage IV (severe) ([place]) 05/30/2016  . Chronic pain in left  shoulder   . CKD (chronic kidney disease)    pt not aware of this  . CKD (chronic kidney disease) stage 3, GFR 30-59 ml/min ([facility])   . Depression   . Grade I diastolic dysfunction 62/95/2841   Noted on ECHO  . History of hiatal hernia 05/29/2016   Moderate, noted on CXR  . Humerus fracture 02/24/2017   Left  . Hypertension   . Hypothyroidism   . LVH (left ventricular hypertrophy) 12/23/2015   Mild, noted on ECHO  . Pneumonia   . Pre-diabetes   . PVC's (premature ventricular contractions) 12/22/2015  . 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   Weight as of this encounter: 111.7 kg. Advance diet Up with therapy Discharge home   DVT Prophylaxis - Aspirin Weight-Bearing as tolerated  Continue therapy today. Plan for DC home today. Follow up in office in 2 weeks with Dr. Lyla Glassing.   Ardeen Jourdain, PA-C Orthopaedic Surgery 02/17/2018, 7:38 AM

## 2018-02-17 NOTE — Progress Notes (Signed)
Physical Therapy Treatment Patient Details Name: Cheryl Lane MRN: [id number] DOB: [birth year] Today's Date: 02/17/2018    History of Present Illness Pt s/p L TKR and with hx of CKD, Reverse L TSR, R TKR and cervical radiculopathy    PT Comments    Pt ambulated increased distance in hall and negotiated stairs with assistance.    Follow Up Recommendations  Follow surgeon's recommendation for DC plan and follow-up therapies     Equipment Recommendations  None recommended by PT    Recommendations for Other Services       Precautions / Restrictions Precautions Precautions: Knee;Fall Restrictions Weight Bearing Restrictions: No LLE Weight Bearing: Weight bearing as tolerated    Mobility  Bed Mobility               General bed mobility comments: Pt up in chair and requests back to same  Transfers Overall transfer level: Needs assistance Equipment used: Rolling walker (2 wheeled) Transfers: Sit to/from Stand Sit to Stand: Min assist;Mod assist         General transfer comment: cues for LE management and use of UEs to self assist  Ambulation/Gait Ambulation/Gait assistance: Min assist;Min guard Gait Distance (Feet): 42 Feet Assistive device: Rolling walker (2 wheeled) Gait Pattern/deviations: Step-to pattern;Decreased step length - right;Decreased step length - left;Shuffle;Trunk flexed Gait velocity: decr   General Gait Details: min cues for sequence, posture and position from RW.  Increased time and standing rest breaks required for task completion   Stairs Stairs: Yes Stairs assistance: Min assist Stair Management: One rail Right;Step to pattern;With crutches;Forwards Number of Stairs: 2 General stair comments: increased time with cues for sequence and foot/crutch placement - written instructions provided   Wheelchair Mobility    Modified Rankin (Stroke Patients Only)       Balance Overall balance assessment: Mild deficits observed, not  formally tested                                          Cognition Arousal/Alertness: Awake/alert Behavior During Therapy: WFL for tasks assessed/performed Overall Cognitive Status: Within Functional Limits for tasks assessed                                        Exercises Total Joint Exercises Ankle Circles/Pumps: AROM;Both;Supine;15 reps Quad Sets: AROM;Both;10 reps;Supine Heel Slides: AAROM;Left;Supine;20 reps Straight Leg Raises: AAROM;Left;Supine;20 reps    General Comments        Pertinent Vitals/Pain Pain Assessment: 0-10 Pain Score: 6  Pain Location: L knee Pain Descriptors / Indicators: Aching;Sore;Operative site guarding Pain Intervention(s): Limited activity within patient's tolerance;Monitored during session;Premedicated before session;Ice applied    Home Living                      Prior Function            PT Goals (current goals can now be found in the care plan section) Acute Rehab PT Goals Patient Stated Goal: Two knees that dont hurt PT Goal Formulation: With patient Time For Goal Achievement: 02/21/18 Potential to Achieve Goals: Good Progress towards PT goals: Progressing toward goals    Frequency    7X/week      PT Plan Current plan remains appropriate    Co-evaluation  AM-PAC PT "6 Clicks" Daily Activity  Outcome Measure  Difficulty turning over in bed (including adjusting bedclothes, sheets and blankets)?: Unable Difficulty moving from lying on back to sitting on the side of the bed? : Unable Difficulty sitting down on and standing up from a chair with arms (e.g., wheelchair, bedside commode, etc,.)?: Unable Help needed moving to and from a bed to chair (including a wheelchair)?: A Lot Help needed walking in hospital room?: A Little Help needed climbing 3-5 steps with a railing? : A Lot 6 Click Score: 10    End of Session Equipment Utilized During Treatment: Gait  belt Activity Tolerance: Patient limited by fatigue;Patient tolerated treatment well Patient left: in chair;with call bell/phone within reach Nurse Communication: Mobility status PT Visit Diagnosis: Muscle weakness (generalized) (M62.81);Difficulty in walking, not elsewhere classified (R26.2);Pain Pain - Right/Left: Left Pain - part of body: Knee     Time: 1610-9604 PT Time Calculation (min) (ACUTE ONLY): 22 min  Charges:  $Gait Training: 8-22 mins $Therapeutic Exercise: 8-22 mins                     [place] Pager [phone] Office [phone]    Wynee Matarazzo 02/17/2018, 1:14 PM

## 2018-02-17 NOTE — Care Management Note (Signed)
Case Management Note  Patient Details  Name: Cheryl Lane MRN: [id number] Date of Birth: 04-11-1951  Subjective/Objective: d/c home w/otpt PT, has all dme. No further CM needs.                   Action/Plan:d/c home.   Expected Discharge Date:  02/17/18               Expected Discharge Plan:  OP Rehab  In-House Referral:     Discharge planning Services  CM Consult  Post Acute Care Choice:    Choice offered to:     DME Arranged:    DME Agency:     HH Arranged:    HH Agency:     Status of Service:  Completed, signed off  If discussed at [employer] of Stay Meetings, dates discussed:    Additional Comments:  Dessa Phi, RN 02/17/2018, 12:24 PM

## 2018-02-18 ENCOUNTER — Ambulatory Visit: Payer: Medicare HMO | Admitting: Physical Therapy

## 2018-02-18 ENCOUNTER — Encounter (HOSPITAL_COMMUNITY): Payer: Self-pay | Admitting: Orthopedic Surgery

## 2018-02-18 MED FILL — HYDROCODON-APAP 5-325: 5-325 | 7 days supply | Qty: 50 | Fill #0

## 2018-02-18 MED FILL — METHOCARBAMOL 500 MG TABLET: 500 | 5 days supply | Qty: 20 | Fill #0

## 2018-02-18 MED FILL — ONDANSETRON HCL 4 MG TABLET: 4 | 8 days supply | Qty: 20 | Fill #0

## 2018-02-20 ENCOUNTER — Ambulatory Visit: Payer: Medicare HMO | Attending: Orthopedic Surgery | Admitting: Physical Therapy

## 2018-02-20 ENCOUNTER — Encounter: Payer: Self-pay | Admitting: Physical Therapy

## 2018-02-20 ENCOUNTER — Ambulatory Visit: Payer: Medicare HMO | Admitting: Physical Therapy

## 2018-02-20 DIAGNOSIS — R262 Difficulty in walking, not elsewhere classified: Secondary | ICD-10-CM | POA: Insufficient documentation

## 2018-02-20 DIAGNOSIS — M25662 Stiffness of left knee, not elsewhere classified: Secondary | ICD-10-CM | POA: Diagnosis not present

## 2018-02-20 DIAGNOSIS — R600 Localized edema: Secondary | ICD-10-CM | POA: Diagnosis not present

## 2018-02-20 DIAGNOSIS — M25562 Pain in left knee: Secondary | ICD-10-CM | POA: Insufficient documentation

## 2018-02-20 NOTE — Therapy (Signed)
[facility] [place], [place], [address] Phone: [phone]   Fax:  [phone]  Physical Therapy Evaluation  Patient Details  Name: Cheryl Lane MRN: [id number] Date of Birth: [birth year] Referring Provider (PT): Dr. Delfino Lovett   Encounter Date: 02/20/2018  PT End of Session - 02/20/18 0944    Visit Number  1    Date for PT Re-Evaluation  04/22/18    PT Start Time  0915    PT Stop Time  1005    PT Time Calculation (min)  50 min    Equipment Utilized During Treatment  Gait belt    Activity Tolerance  Patient limited by pain    Behavior During Therapy  Anxious       Past Medical History:  Diagnosis Date  . Acute respiratory failure with hypoxia ([place]) 06/07/2016   RESOLVED   . Anemia    as a child  . Anxiety 06/07/2016  . Aortic atherosclerosis ([place])   . Arthritis   . Asthma    bronchitis  . CAP (community acquired pneumonia) 12/21/2015  . Cervical radiculopathy   . Chronic kidney disease (CKD), stage IV (severe) ([place]) 05/30/2016  . Chronic pain in left shoulder   . CKD (chronic kidney disease)    pt not aware of this  . CKD (chronic kidney disease) stage 3, GFR 30-59 ml/min (H[facility])   . Depression   . Grade I diastolic dysfunction 23/76/2831   Noted on ECHO  . History of hiatal hernia 05/29/2016   Moderate, noted on CXR  . Humerus fracture 02/24/2017   Left  . Hypertension   . Hypothyroidism   . LVH (left ventricular hypertrophy) 12/23/2015   Mild, noted on ECHO  . Pneumonia   . Pre-diabetes   . PVC's (premature ventricular contractions) 12/22/2015  . 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 APPENDECTOMY    . KNEE ARTHROPLASTY Right 11/15/2017   Procedure: RIGHT TOTAL KNEE ARTHROPLASTY WITH COMPUTER NAVIGATION;  Surgeon: Rod Can, MD;  Location: [facility];  Service: Orthopedics;  Laterality: Right;  Needs RNFA   . KNEE ARTHROPLASTY Left 02/14/2018   Procedure: LEFT TOTAL KNEE ARTHROPLASTY WITH COMPUTER NAVIGATION;  Surgeon: Rod Can, MD;  Location: [facility];  Service: Orthopedics;  Laterality: Left;  Adductor Block  . REVERSE SHOULDER ARTHROPLASTY Left 03/01/2017   Procedure: REVERSE SHOULDER ARTHROPLASTY;  Surgeon: Justice Britain, MD;  Location: [facility];  Service: Orthopedics;  Laterality: Left;  . TUBAL LIGATION      There were no vitals filed for this visit.   Subjective Assessment - 02/20/18 0923    Subjective  Patient had a right TKR in July, she underwent a left TKR on 02/14/18, she reports that she was in the hospital extra days due to "severe pain", she reports that she is miserable, she cancelled her appointment with [place] on Monday because she was in so much pain.    Limitations  Lifting;Standing;Walking;House hold activities    Patient Stated Goals  walk without pain, have good ROM    Currently in Pain?  Yes    Pain Score  10-Worst pain ever    Pain Location  Knee    Pain Orientation  Left    Pain Descriptors / Indicators  Aching;Sore    Pain Type  Acute pain;Surgical pain    Pain Onset  In the past 7 days  Pain Frequency  Constant    Aggravating Factors   just hurts 10/10    Pain Relieving Factors  reports that she is taking pain meds and putting ice on the knee but she reports pain does not ease    Effect of Pain on Daily Activities  limits everything         [facility] PT Assessment - 02/20/18 0001      Assessment   Medical Diagnosis  s/p left TKR    Referring Provider (PT)  Dr. Delfino Lovett    Onset Date/Surgical Date  02/14/18    Prior Therapy  for the right TKR in August      Precautions   Precautions  None      Balance Screen   Has the patient fallen in the past 6 months  No    Has the patient had a decrease in activity level because of a fear of falling?   No    Is the patient reluctant to leave their home because of a fear of falling?   No      Home Environment    Additional Comments  lives with daughter and 3 granddaughters, does some cleaning      Prior Function   Level of Independence  Independent with household mobility with device    Vocation  Retired    Leisure  no exercise      Circumferential Edema   Circumferential - Right  43.5 cm    Circumferential - Left   51 cm      AROM   AROM Assessment Site  Knee    Right/Left Knee  Left    Left Knee Extension  25    Left Knee Flexion  65      PROM   Right/Left Knee  Left    Left Knee Extension  20    Left Knee Flexion  67      Strength   Right/Left Knee  Left    Left Knee Flexion  3/5    Left Knee Extension  2+/5      Palpation   Palpation comment  bandaging in place, warm to palpation, very tender and gaurded to palpation      Transfers   Comments  required Mod A to stand from sitting      Ambulation/Gait   Gait Comments  uses a FWW, very slow, stiff legged, step to pattern      Timed Up and Go Test   Normal TUG (seconds)  155                Objective measurements completed on examination: See above findings.      [facility] PT Treatment/Exercise - 02/20/18 0001      Vasopneumatic   Number Minutes Vasopneumatic   10 minutes    Vasopnuematic Location   Knee    Vasopneumatic Pressure  Low    Vasopneumatic Temperature   34               PT Short Term Goals - 02/20/18 0949      PT SHORT TERM GOAL #1   Title  independent iwth initial HEP    Time  2    Period  Weeks    Status  New        PT Long Term Goals - 02/20/18 0950      PT LONG TERM GOAL #1   Title  decrease TUG time to 27 seconds    Time  8    Period  Weeks    Status  New      PT LONG TERM GOAL #2   Title  increase AROM to 10-110 degrees flexion    Time  8    Period  Weeks    Status  New      PT LONG TERM GOAL #3   Title  walk with SPC in the home    Time  8    Period  Weeks    Status  New      PT LONG TERM GOAL #4   Title  be able to go up and down stairs with SBA, one  at a time    Time  8    Period  Weeks    Status  New      PT LONG TERM GOAL #5   Title  decrease swelling 3 cm    Time  8    Period  Weeks    Status  New             Plan - 02/20/18 0945    Clinical Impression Statement  Patient underwent a left TKR on 02/14/18, she reports that she stayed extra days int he hospital due to being unable to control the pain.  She comes in today in tears, she rates pain a 10/10 and reports that she cannot have anything to decreae the pain.  She required Mod A to get up from sitting, her TUG was 155 seconds due to pain.  Her AROM was 25-65 degrees flexion limited by pain    History and Personal Factors relevant to plan of care:  right TKR 3 months ago, shoulder pain, other comorbidities    Clinical Presentation  Evolving    Clinical Decision Making  Moderate    Rehab Potential  Good    PT Frequency  3x / week    PT Duration  8 weeks    PT Treatment/Interventions  ADLs/Self Care Home Management;Cryotherapy;Gait training;Stair training;Functional mobility training;Therapeutic activities;Therapeutic exercise;Balance training;Neuromuscular re-education;Patient/family education;Vasopneumatic Device;Manual techniques;Electrical Stimulation    PT Next Visit Plan  start her moving, address pain as needed    Consulted and Agree with Plan of Care  Patient       Patient will benefit from skilled therapeutic intervention in order to improve the following deficits and impairments:  Abnormal gait, Decreased range of motion, Difficulty walking, Decreased endurance, Cardiopulmonary status limiting activity, Decreased activity tolerance, Pain, Impaired flexibility, Decreased scar mobility, Decreased balance, Decreased mobility, Decreased strength, Increased edema  Visit Diagnosis: Acute pain of left knee - Plan: PT plan of care cert/re-cert  Stiffness of left knee, not elsewhere classified - Plan: PT plan of care cert/re-cert  Difficulty in walking, not elsewhere  classified - Plan: PT plan of care cert/re-cert  Localized edema - Plan: PT plan of care cert/re-cert     Problem List Patient Active Problem List   Diagnosis Date Noted  . Osteoarthritis of left knee 02/14/2018  . Osteoarthritis of right knee 11/15/2017  . Humerus fracture 03/09/2017  . Asthma 03/09/2017  . Hypothyroidism 03/09/2017  . 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 Genetic testing 02/06/2017  . Acute respiratory failure with hypoxia ([place]) 06/07/2016  . RSV (acute bronchiolitis due to respiratory syncytial virus) 06/07/2016  . Anxiety 06/07/2016  . Arthritis   . Depression 05/30/2016  . Chronic kidney disease (CKD), stage IV (severe) ([place]) 05/30/2016  . Mild intermittent asthma with acute exacerbation   .  Respiratory distress   . CKD (chronic kidney disease) stage 3, GFR 30-59 ml/min (HCC)   . Chronic pain syndrome   . Bronchitis 05/29/2016  . Left shoulder pain   . Opacity of lung on imaging study   . Shoulder pain, acute   . Weakness generalized 12/22/2015  . Essential hypertension 12/22/2015  . Pneumonia 12/22/2015  . PVC's (premature ventricular contractions) 12/22/2015  . CAP (community acquired pneumonia) 12/21/2015    Sumner Boast., PT 02/20/2018, 9:59 AM  [address] [place], [place], [address] Phone: [phone]   Fax:  [phone]  Name: SHELDA TRUBY MRN: [id number] Date of Birth: 16-Jul-1950

## 2018-02-21 ENCOUNTER — Emergency Department (HOSPITAL_COMMUNITY)
Admission: EM | Admit: 2018-02-21 | Discharge: 2018-02-21 | Disposition: A | Discharge status: Home / Self Care | Payer: Medicare HMO | Attending: Emergency Medicine | Admitting: Emergency Medicine

## 2018-02-21 ENCOUNTER — Emergency Department (HOSPITAL_COMMUNITY): Payer: Medicare HMO

## 2018-02-21 ENCOUNTER — Other Ambulatory Visit: Payer: Self-pay

## 2018-02-21 ENCOUNTER — Encounter (HOSPITAL_COMMUNITY): Payer: Self-pay | Admitting: *Deleted

## 2018-02-21 DIAGNOSIS — K5900 Constipation, unspecified: Secondary | ICD-10-CM | POA: Insufficient documentation

## 2018-02-21 DIAGNOSIS — K625 Hemorrhage of anus and rectum: Secondary | ICD-10-CM | POA: Insufficient documentation

## 2018-02-21 DIAGNOSIS — I10 Essential (primary) hypertension: Secondary | ICD-10-CM | POA: Diagnosis not present

## 2018-02-21 DIAGNOSIS — I509 Heart failure, unspecified: Secondary | ICD-10-CM | POA: Diagnosis not present

## 2018-02-21 DIAGNOSIS — Z7982 Long term (current) use of aspirin: Secondary | ICD-10-CM | POA: Insufficient documentation

## 2018-02-21 DIAGNOSIS — Z79899 Other long term (current) drug therapy: Secondary | ICD-10-CM | POA: Insufficient documentation

## 2018-02-21 DIAGNOSIS — R0689 Other abnormalities of breathing: Secondary | ICD-10-CM | POA: Diagnosis not present

## 2018-02-21 DIAGNOSIS — I130 Hypertensive heart and chronic kidney disease with heart failure and stage 1 through stage 4 chronic kidney disease, or unspecified chronic kidney disease: Secondary | ICD-10-CM | POA: Diagnosis not present

## 2018-02-21 DIAGNOSIS — K8020 Calculus of gallbladder without cholecystitis without obstruction: Secondary | ICD-10-CM | POA: Diagnosis not present

## 2018-02-21 DIAGNOSIS — R58 Hemorrhage, not elsewhere classified: Secondary | ICD-10-CM | POA: Diagnosis not present

## 2018-02-21 DIAGNOSIS — R0902 Hypoxemia: Secondary | ICD-10-CM | POA: Diagnosis not present

## 2018-02-21 DIAGNOSIS — E1165 Type 2 diabetes mellitus with hyperglycemia: Secondary | ICD-10-CM | POA: Diagnosis not present

## 2018-02-21 DIAGNOSIS — J45909 Unspecified asthma, uncomplicated: Secondary | ICD-10-CM | POA: Diagnosis not present

## 2018-02-21 DIAGNOSIS — N184 Chronic kidney disease, stage 4 (severe): Secondary | ICD-10-CM | POA: Insufficient documentation

## 2018-02-21 LAB — TYPE AND SCREEN
ABO/RH(D): B POS
ANTIBODY SCREEN: NEGATIVE

## 2018-02-21 LAB — CBC WITH DIFFERENTIAL/PLATELET
Abs Immature Granulocytes: 0.04 10*3/uL (ref 0.00–0.07)
BASOS ABS: 0 10*3/uL (ref 0.0–0.1)
Basophils Relative: 0 %
Eosinophils Absolute: 0 10*3/uL (ref 0.0–0.5)
Eosinophils Relative: 1 %
HCT: 30.4 % — ABNORMAL LOW (ref 36.0–46.0)
HEMOGLOBIN: 9.1 g/dL — AB (ref 12.0–15.0)
Immature Granulocytes: 1 %
Lymphocytes Relative: 15 %
Lymphs Abs: 1 10*3/uL (ref 0.7–4.0)
MCH: 25.9 pg — ABNORMAL LOW (ref 26.0–34.0)
MCHC: 29.9 g/dL — ABNORMAL LOW (ref 30.0–36.0)
MCV: 86.6 fL (ref 80.0–100.0)
Monocytes Absolute: 0.5 10*3/uL (ref 0.1–1.0)
Monocytes Relative: 8 %
NRBC: 0 % (ref 0.0–0.2)
Neutro Abs: 5.1 10*3/uL (ref 1.7–7.7)
Neutrophils Relative %: 75 %
Platelets: 394 10*3/uL (ref 150–400)
RBC: 3.51 MIL/uL — AB (ref 3.87–5.11)
RDW: 15.5 % (ref 11.5–15.5)
WBC: 6.7 10*3/uL (ref 4.0–10.5)

## 2018-02-21 LAB — URINALYSIS, ROUTINE W REFLEX MICROSCOPIC
BILIRUBIN URINE: NEGATIVE
Glucose, UA: NEGATIVE mg/dL
Ketones, ur: NEGATIVE mg/dL
Leukocytes, UA: NEGATIVE
Nitrite: NEGATIVE
Protein, ur: NEGATIVE mg/dL
Specific Gravity, Urine: 1.008 (ref 1.005–1.030)
pH: 7 (ref 5.0–8.0)

## 2018-02-21 LAB — COMPREHENSIVE METABOLIC PANEL
ALBUMIN: 3.3 g/dL — AB (ref 3.5–5.0)
ALT: 46 U/L — ABNORMAL HIGH (ref 0–44)
AST: 49 U/L — ABNORMAL HIGH (ref 15–41)
Alkaline Phosphatase: 140 U/L — ABNORMAL HIGH (ref 38–126)
Anion gap: 9 (ref 5–15)
BUN: 14 mg/dL (ref 8–23)
CHLORIDE: 105 mmol/L (ref 98–111)
CO2: 27 mmol/L (ref 22–32)
Calcium: 8.7 mg/dL — ABNORMAL LOW (ref 8.9–10.3)
Creatinine, Ser: 0.96 mg/dL (ref 0.44–1.00)
GFR calc Af Amer: >60 mL/min (ref >60)
GFR calc non Af Amer: 59 mL/min — ABNORMAL LOW (ref >60)
GLUCOSE: 112 mg/dL — AB (ref 70–99)
POTASSIUM: 3.6 mmol/L (ref 3.5–5.1)
SODIUM: 141 mmol/L (ref 135–145)
Total Bilirubin: 0.5 mg/dL (ref 0.3–1.2)
Total Protein: 7.4 g/dL (ref 6.5–8.1)

## 2018-02-21 LAB — PROTIME-INR
INR: 0.89
Prothrombin Time: 12 seconds (ref 11.4–15.2)

## 2018-02-21 LAB — LIPASE, BLOOD: LIPASE: 22 U/L (ref 11–51)

## 2018-02-21 LAB — POC OCCULT BLOOD, ED: FECAL OCCULT BLD: POSITIVE — AB

## 2018-02-21 MED ORDER — HYDROCORTISONE 2.5 % RE CREA: TOPICAL_CREAM | RECTAL | 0 refills | Status: AC

## 2018-02-21 MED ORDER — SODIUM CHLORIDE 0.9 % IJ SOLN
INTRAMUSCULAR | Status: AC
  Filled 2018-02-21: qty 50

## 2018-02-21 MED ORDER — SODIUM CHLORIDE 0.9 % IV BOLUS
500.0000 mL | Freq: Once | INTRAVENOUS | Status: AC
  Administered 2018-02-21: 500 mL via INTRAVENOUS

## 2018-02-21 MED ORDER — POLYETHYLENE GLYCOL 3350 17 G PO PACK: 17.0000 g | PACK | Freq: Every day | ORAL | 0 refills | Status: AC

## 2018-02-21 MED ORDER — IOPAMIDOL (ISOVUE-300) INJECTION 61%
INTRAVENOUS | Status: AC
  Filled 2018-02-21: qty 100

## 2018-02-21 MED ORDER — MORPHINE SULFATE (PF) 4 MG/ML IV SOLN
4.0000 mg | Freq: Once | INTRAVENOUS | Status: AC
  Administered 2018-02-21: 4 mg via INTRAVENOUS
  Filled 2018-02-21: qty 1

## 2018-02-21 MED ORDER — IOPAMIDOL (ISOVUE-300) INJECTION 61%
100.0000 mL | Freq: Once | INTRAVENOUS | Status: AC | PRN
  Administered 2018-02-21: 100 mL via INTRAVENOUS

## 2018-02-21 NOTE — Discharge Instructions (Addendum)
Please follow-up with your regular doctor in 1 week.    Your liver enzymes were slightly elevated today and so is your alkaline phosphatase, it would be beneficial for you to have your labs repeated in 1 week.  You were also shown to have gallstones in your gallbladder, and I recommend obtaining an outpatient right upper quadrant ultrasound for further evaluation of this.  Please take your prescriptions as directed on your discharge paperwork.  If you continue having abdominal pain or if you continue to have bloody stools, then you should return to the emergency department immediately.

## 2018-02-21 NOTE — ED Provider Notes (Signed)
[facility] DEPT Provider Note   CSN: [id number] Arrival date & time: 02/21/18  1354     History   Chief Complaint Chief Complaint  Patient presents with  . Rectal Bleeding    HPI [place] is a 67 y.o. female.  HPI  Pt is a 67 y/o female with ah /o asthma, CKD, CHF, recent left TKR (02/15/18), who presents to the ED today c/o rectal bleeding. Pt states she has been constipated since her left knee replacement and had had a BM in 1 week. She took colace yesterday in attempt to have a BM. She then had a a small, hard BM with associated bright red blood per rectum. States that since then she has had bright red/dark red bleeding every time she uses the restroom.  Denies dysuria, hematuria vaginal bleeding.  Has had some abdominal discomfort as well.  No fevers or chills.  Patient on 81 mg aspirin at home.  No other blood thinners or antiplatelet medications.  Has never had bleeding from her GI tract before.  Past Medical History:  Diagnosis Date  . Acute respiratory failure with hypoxia ([place]) 06/07/2016   RESOLVED   . Anemia    as a child  . Anxiety 06/07/2016  . Aortic atherosclerosis ([place])   . Arthritis   . Asthma    bronchitis  . CAP (community acquired pneumonia) 12/21/2015  . Cervical radiculopathy   . Chronic kidney disease (CKD), stage IV (severe) ([place]) 05/30/2016  . Chronic pain in left shoulder   . CKD (chronic kidney disease)    pt not aware of this  . CKD (chronic kidney disease) stage 3, GFR 30-59 ml/min ([facility])   . Depression   . Grade I diastolic dysfunction 62/86/3817   Noted on ECHO  . History of hiatal hernia 05/29/2016   Moderate, noted on CXR  . Humerus fracture 02/24/2017   Left  . Hypertension   . Hypothyroidism   . LVH (left ventricular hypertrophy) 12/23/2015   Mild, noted on ECHO  . Pneumonia   . Pre-diabetes   . PVC's (premature ventricular contractions) 12/22/2015  . 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 Osteoarthritis of left knee 02/14/2018  . Osteoarthritis of right knee 11/15/2017  . Humerus fracture 03/09/2017  . Asthma 03/09/2017  . Hypothyroidism 03/09/2017  . 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 Genetic testing 02/06/2017  . Acute respiratory failure with hypoxia ([place]) 06/07/2016  . RSV (acute bronchiolitis due to respiratory syncytial virus) 06/07/2016  . Anxiety 06/07/2016  . Arthritis   . Depression 05/30/2016  . Chronic kidney disease (CKD), stage IV (severe) ([place]) 05/30/2016  . Mild intermittent asthma with acute exacerbation   . Respiratory distress   . CKD (chronic kidney disease) stage 3, GFR 30-59 ml/min ([facility])   . Chronic pain syndrome   . Bronchitis 05/29/2016  . Left shoulder pain   . Opacity of lung on imaging study   . Shoulder pain, acute   . Weakness generalized 12/22/2015  . Essential hypertension 12/22/2015  . Pneumonia 12/22/2015  . PVC's (premature ventricular contractions) 12/22/2015  . CAP (community acquired pneumonia) 12/21/2015    Past Surgical History:  Procedure Laterality Date  . APPENDECTOMY    . KNEE ARTHROPLASTY Right 11/15/2017   Procedure: RIGHT TOTAL KNEE ARTHROPLASTY WITH COMPUTER NAVIGATION;  Surgeon: Rod Can, MD;  Location: [facility];  Service:  Orthopedics;  Laterality: Right;  Needs RNFA  . KNEE ARTHROPLASTY Left 02/14/2018   Procedure: LEFT TOTAL KNEE ARTHROPLASTY WITH COMPUTER NAVIGATION;  Surgeon: Rod Can, MD;  Location: [facility];  Service: Orthopedics;  Laterality: Left;  Adductor Block  . REVERSE SHOULDER ARTHROPLASTY Left 03/01/2017   Procedure: REVERSE SHOULDER ARTHROPLASTY;  Surgeon: Justice Britain, MD;  Location: [facility];  Service: Orthopedics;  Laterality: Left;  . TUBAL LIGATION       OB History   None      Home Medications    Prior to Admission medications   Medication Sig Start Date End Date  Taking? Authorizing Provider  aspirin EC 81 MG tablet Take 81 mg by mouth daily.   Yes [provider]  docusate sodium (COLACE) 100 MG capsule Take 1 capsule (100 mg total) by mouth 2 (two) times daily. Patient taking differently: Take 100 mg by mouth daily.  11/16/17  Yes Swinteck, Aaron Edelman, MD  escitalopram (LEXAPRO) 10 MG tablet Take 10 mg daily by mouth.   Yes [provider]  HYDROcodone-acetaminophen (NORCO/VICODIN) 5-325 MG tablet Take 1-2 tablets by mouth every 6 (six) hours as needed for moderate pain. 02/15/18  Yes Swinteck, Aaron Edelman, MD  levothyroxine (SYNTHROID, LEVOTHROID) 100 MCG tablet Take 100 mcg by mouth daily before breakfast.  01/02/18  Yes [provider]  lisinopril (PRINIVIL,ZESTRIL) 10 MG tablet Take 2 tablets (20 mg total) by mouth daily. 06/03/16  Yes Ghimire, Henreitta Leber, MD  lisinopril (PRINIVIL,ZESTRIL) 20 MG tablet  01/02/18  Yes [provider]  methocarbamol (ROBAXIN) 500 MG tablet Take 1 tablet (500 mg total) by mouth every 6 (six) hours as needed for muscle spasms. 02/15/18  Yes Swinteck, Aaron Edelman, MD  aspirin 81 MG chewable tablet Chew 1 tablet (81 mg total) by mouth 2 (two) times daily. Patient not taking: Reported on 02/04/2018 11/16/17   Rod Can, MD  hydrocortisone (ANUSOL-HC) 2.5 % rectal cream Apply rectally 2 times daily 02/21/18   Amaia Lavallie S, PA-C  ondansetron (ZOFRAN) 4 MG tablet Take 1 tablet (4 mg total) by mouth every 6 (six) hours as needed for nausea. 02/15/18   Swinteck, Aaron Edelman, MD  polyethylene glycol [facility]) packet Take 17 g by mouth daily. Dissolve one cap full in solution (water, gatorade, etc.) and administer once cap-full daily. You may titrate up daily by 1 cap-full until the patient is having pudding consistency of stools. After the patient is able to start passing softer stools they will need to be on 1/2 cap-full daily for 2 weeks. 02/21/18   Amillion Macchia S, PA-C  pravastatin (PRAVACHOL) 20 MG tablet  Take 20 mg by mouth 2 (two) times a week. Twice a week    [provider]  senna (SENOKOT) 8.6 MG TABS tablet Take 1 tablet (8.6 mg total) by mouth 2 (two) times daily. Patient taking differently: Take 1 tablet by mouth daily as needed for moderate constipation.  11/16/17   Rod Can, MD    Family History Family History  Problem Relation Age of Onset  . Hypertension Other   . Breast cancer Mother 13  . Breast cancer Sister 59  . Lung cancer Brother   . Cancer Sister 65       "female" cancer  . Cancer Sister        unknown cancer  . Breast cancer Sister 6    Social History Social History   Tobacco Use  . Smoking status: Never Smoker  . Smokeless tobacco: Never Used  Substance  Use Topics  . Alcohol use: No  . Drug use: No     Allergies   Patient has no known allergies.   Review of Systems Review of Systems  Constitutional: Negative for chills and fever.  HENT: Negative for congestion.   Eyes: Negative for visual disturbance.  Respiratory: Negative for shortness of breath.   Cardiovascular: Positive for leg swelling (from prior surgery). Negative for chest pain.  Gastrointestinal: Positive for abdominal pain, anal bleeding, blood in stool and constipation. Negative for diarrhea, nausea and vomiting.  Genitourinary: Negative for dysuria, frequency and hematuria.  Musculoskeletal: Negative for back pain.       Left knee pain  Skin:       Surgical scar  Neurological: Negative for headaches.   Physical Exam Updated Vital Signs BP (!) 145/74   Pulse 82   Temp 98.6 F (37 C) (Oral)   Resp 18   Ht 5' 5"  (1.651 m)   Wt 108.4 kg   SpO2 95%   BMI 39.77 kg/m   Physical Exam  Constitutional: She appears well-developed and well-nourished. She appears distressed.  HENT:  Head: Normocephalic and atraumatic.  Mucous membranes dry  Eyes: Conjunctivae are normal.  Neck: Neck supple.  Cardiovascular: Normal rate, regular rhythm and normal heart sounds.    No murmur heard. Pulmonary/Chest: Effort normal and breath sounds normal. No respiratory distress. She has no wheezes.  Abdominal: Soft.  morbidly obese. Mild generalized abd TTP. No rebound TTP. No guarding. BS normal in all 4 quadrants.  Genitourinary:  Genitourinary Comments: Chaperone present for digital rectal exam. External hemorrhoids present, nonthrombsed. Stool is light brown and there is bright red blood present. No melena present. Disimpaction was attempted with mild outpt.   Musculoskeletal:  Surgical scar to left knee is well healing. No surrounding erythema or TTP. LLE edema.  Neurological: She is alert.  Moving all extremities  Skin: Skin is warm and dry.  Psychiatric: She has a normal mood and affect.  Nursing note and vitals reviewed.    ED Treatments / Results  Labs (all labs ordered are listed, but only abnormal results are displayed) Labs Reviewed  CBC WITH DIFFERENTIAL/PLATELET - Abnormal; Notable for the following components:      Result Value   RBC 3.51 (*)    Hemoglobin 9.1 (*)    HCT 30.4 (*)    MCH 25.9 (*)    MCHC 29.9 (*)    All other components within normal limits  COMPREHENSIVE METABOLIC PANEL - Abnormal; Notable for the following components:   Glucose, Bld 112 (*)    Calcium 8.7 (*)    Albumin 3.3 (*)    AST 49 (*)    ALT 46 (*)    Alkaline Phosphatase 140 (*)    GFR calc non Af Amer 59 (*)    All other components within normal limits  URINALYSIS, ROUTINE W REFLEX MICROSCOPIC - Abnormal; Notable for the following components:   Hgb urine dipstick LARGE (*)    Bacteria, UA RARE (*)    All other components within normal limits  POC OCCULT BLOOD, ED - Abnormal; Notable for the following components:   Fecal Occult Bld POSITIVE (*)    All other components within normal limits  URINE CULTURE  PROTIME-INR  LIPASE, BLOOD  TYPE AND SCREEN    EKG None  Radiology Ct Abdomen Pelvis W Contrast  Result Date: 02/21/2018 CLINICAL DATA:  Rectal  bleeding since last night. History of recent knee surgery. No bowel movement  in 1 week. EXAM: CT ABDOMEN AND PELVIS WITH CONTRAST TECHNIQUE: Multidetector CT imaging of the abdomen and pelvis was performed using the standard protocol following bolus administration of intravenous contrast. CONTRAST:  157m ISOVUE-300 IOPAMIDOL (ISOVUE-300) INJECTION 61% COMPARISON:  None. FINDINGS: Lower chest: The lung bases are clear of an acute process. No worrisome pulmonary lesions. The heart is normal in size. No pericardial effusion. There is a large hiatal hernia. Hepatobiliary: No focal hepatic lesions without contrast. There are rim calcified gallstones in the gallbladder but no definite findings for acute cholecystitis. Moderate common bile duct dilatation. Maximum diameter is 15 mm. No obvious common bile duct stones. Recommend correlation with liver function studies. Pancreas: No mass, inflammation or ductal dilatation. Spleen: Normal size.  No focal lesions. Adrenals/Urinary Tract: The adrenal glands and kidneys are unremarkable. No renal, ureteral or bladder calculi or mass. Stomach/Bowel: Large hiatal hernia with a good portion of the stomach up in the chest. The duodenum, small bowel and colon are grossly normal without oral contrast. No acute inflammatory changes, mass lesions or obstructive findings. Sigmoid colon diverticulosis without findings for acute diverticulitis. There is a large amount of stool in the rectum with mild rectal wall thickening and perirectal inflammatory changes suggesting fecal impaction. Vascular/Lymphatic: The aorta is normal in caliber. Scattered atheroscerlotic calcifications. No mesenteric of retroperitoneal mass or adenopathy. Small scattered lymph nodes are noted. Reproductive: The uterus and ovaries are unremarkable. Other: Small scattered pelvic lymph nodes but no mass or overt adenopathy. No inguinal mass or adenopathy. Small periumbilical abdominal wall hernia containing fat.  Musculoskeletal: No significant bony findings. Moderate osteoporosis and moderate degenerative lumbar spondylosis with multilevel disc disease and facet disease. IMPRESSION: 1. Large amount of stool in the rectum with associated rectal wall thickening and perirectal inflammatory changes suggesting fecal impaction. 2. No other acute abdominal/pelvic findings are identified. No mass lesions or adenopathy. 3. Cholelithiasis without CT findings to suggest acute cholecystitis. 4. Moderate common bile duct dilatation without obvious common bile duct stone. Recommend correlation with liver function studies. Electronically Signed   By: PMarijo SanesM.D.   On: 02/21/2018 17:37    Procedures Fecal disimpaction Date/Time: 02/21/2018 6:05 PM Performed by: CRodney Booze PA-C Authorized by: CRodney Booze PA-C  Consent: Verbal consent obtained. Risks and benefits: risks, benefits and alternatives were discussed Consent given by: patient Patient understanding: patient does not state understanding of the procedure being performed Patient consent: the patient's understanding of the procedure does not match consent given Patient identity confirmed: verbally with patient Local anesthesia used: no  Anesthesia: Local anesthesia used: no  Sedation: Patient sedated: no  Patient tolerance: Patient tolerated the procedure well with no immediate complications    (including critical care time)  Medications Ordered in ED Medications  iopamidol (ISOVUE-300) 61 % injection (has no administration in time range)  sodium chloride 0.9 % injection (has no administration in time range)  sodium chloride 0.9 % bolus 500 mL (0 mLs Intravenous Stopped 02/21/18 1656)  morphine 4 MG/ML injection 4 mg (4 mg Intravenous Given 02/21/18 1556)  iopamidol (ISOVUE-300) 61 % injection 100 mL (100 mLs Intravenous Contrast Given 02/21/18 1711)     Initial Impression / Assessment and Plan / ED Course  I have reviewed the  triage vital signs and the nursing notes.  Pertinent labs & imaging results that were available during my care of the patient were reviewed by me and considered in my medical decision making (see chart for details).  Final Clinical Impressions(s) / ED Diagnoses   Final diagnoses:  Rectal bleeding   Patient presenting with history of constipation and bright red blood per rectum starting yesterday.  Had tried taking Colace at home and had small amount of stool output.  Abdomen with mild generalized tenderness, no rebound or guarding.  No focal tenderness.  Rectal exam with brown stool and bright red blood present.  External hemorrhoids present.  Fecal disimpaction completed with mild amount of stool output.  Patient had a bowel movement following this.  Labs are reassuring with anemia that is improving since 4 days ago.  CMP with normal kidney function, liver enzymes slightly elevated as well as alk phos.  Bilirubin is normal as well as lipase normal.  Therefore doubt gallbladder pathology.  Gallstones noted on CT scan, discussed with patient and advised right upper quadrant ultrasound as an outpatient.  CT scan without bowel obstruction, perforation or other emergent pathology that would require further admission.  Did show moderate stool.  This further supports suspicion that rectal bleeding secondary to hemorrhoids rather than an acute GI bleed.  Advised patient to orally hydrate, take MiraLAX and use Anusol.  We will give her x-rays.  Have advised to monitor her symptoms return she has any new or worsening symptoms.  Daughter at bedside and patient voiced understanding the plan reasons to return.  All questions answered.  Patient was seen in conjunction with Dr. Ronnald Nian who personally evaluated the patient and agrees with the above plan.  ED Discharge Orders         Ordered    hydrocortisone (ANUSOL-HC) 2.5 % rectal cream     02/21/18 1904    polyethylene glycol [facility]) packet  Daily      02/21/18 1904           Rodney Booze, PA-C 02/21/18 [place], [place], DO 02/22/18 0118

## 2018-02-21 NOTE — ED Triage Notes (Signed)
Pt bib EMS and presents with rectal bleeding since last night.  Pt had recent knee surgery and had not have a BM x 1 week.  Pt took colace yesterday. Pt denies pain to EMS but does endorse a "pressure" feeling in her lower abd. Pt a/o x 4.

## 2018-02-21 NOTE — ED Provider Notes (Signed)
Medical screening examination/treatment/procedure(s) were conducted as a shared visit with non-physician practitioner(s) and myself.  I personally evaluated the patient during the encounter. Briefly, the patient is a 67 y.o. female who presents to the ED with blood in her stool.  Patient with normal vitals.  No fever.  Patient with recent knee surgery and currently on opioids.  She has been struggling with constipation since the surgery.  Has lower abdominal pain.  Patient has been using docusate and senna without much relief.  She has started physical therapy yesterday.  Patient per PA does not have any gross melena on exam.  She had brown stool mixed with red blood.  She had hemorrhoids on exam.  Suspect bleeding likely from hemorrhoids.  Patient with negative urinalysis.  No significant anemia, electrolyte abnormality, kidney injury.  Patient not tachycardic.  Hemoglobin at baseline.  No concern for fast GI bleed.  Suspect likely from hemorrhoids.  CT scan was done that showed stool impaction.  Patient had stones in the gallbladder but otherwise had normal bilirubin and lipase.  Liver enzymes mildly elevated.  Recommend outpatient right upper quadrant ultrasound.  Patient given prescription for MiraLAX.  Given return precautions.  Recommend return to the ED if symptoms worsen.  Recommend follow-up with primary care doctor as well.  This chart was dictated using voice recognition software.  Despite best efforts to proofread,  errors can occur which can change the documentation meaning.    EKG Interpretation None           Lennice Sites, DO 02/21/18 1818

## 2018-02-21 NOTE — ED Notes (Signed)
Bed: WU59 Expected date:  Expected time:  Means of arrival:  Comments: EMS rectal bleed

## 2018-02-22 ENCOUNTER — Ambulatory Visit: Payer: Medicare HMO | Attending: Orthopedic Surgery | Admitting: Physical Therapy

## 2018-02-22 ENCOUNTER — Encounter: Payer: Self-pay | Admitting: Physical Therapy

## 2018-02-22 DIAGNOSIS — M25562 Pain in left knee: Secondary | ICD-10-CM | POA: Insufficient documentation

## 2018-02-22 DIAGNOSIS — M25662 Stiffness of left knee, not elsewhere classified: Secondary | ICD-10-CM | POA: Diagnosis not present

## 2018-02-22 DIAGNOSIS — R600 Localized edema: Secondary | ICD-10-CM | POA: Diagnosis not present

## 2018-02-22 DIAGNOSIS — R262 Difficulty in walking, not elsewhere classified: Secondary | ICD-10-CM | POA: Insufficient documentation

## 2018-02-22 LAB — URINE CULTURE: Culture: <10000 — AB

## 2018-02-22 NOTE — Therapy (Signed)
[place], [place], [address] Phone: [phone]   Fax:  [phone]  Physical Therapy Treatment  Patient Details  Name: Cheryl Lane MRN: [id number] Date of Birth: Jul 01, 1950 Referring Provider (PT): Dr. Delfino Lovett   Encounter Date: 02/22/2018  PT End of Session - 02/22/18 1023    Visit Number  2    Date for PT Re-Evaluation  04/22/18    PT Start Time  0930    PT Stop Time  1034    PT Time Calculation (min)  64 min    Activity Tolerance  Patient limited by pain    Behavior During Therapy  [facility] for tasks assessed/performed;Anxious       Past Medical History:  Diagnosis Date  . Acute respiratory failure with hypoxia ([place]) 06/07/2016   RESOLVED   . Anemia    as a child  . Anxiety 06/07/2016  . Aortic atherosclerosis ()   . Arthritis   . Asthma    bronchitis  . CAP (community acquired pneumonia) 12/21/2015  . Cervical radiculopathy   . Chronic kidney disease (CKD), stage IV (severe) ([place]) 05/30/2016  . Chronic pain in left shoulder   . CKD (chronic kidney disease)    pt not aware of this  . CKD (chronic kidney disease) stage 3, GFR 30-59 ml/min (HCC)   . Depression   . Grade I diastolic dysfunction 26/94/8546   Noted on ECHO  . History of hiatal hernia 05/29/2016   Moderate, noted on CXR  . Humerus fracture 02/24/2017   Left  . Hypertension   . Hypothyroidism   . LVH (left ventricular hypertrophy) 12/23/2015   Mild, noted on ECHO  . Pneumonia   . Pre-diabetes   . PVC's (premature ventricular contractions) 12/22/2015  . 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 APPENDECTOMY    . KNEE ARTHROPLASTY Right 11/15/2017   Procedure: RIGHT TOTAL KNEE ARTHROPLASTY WITH COMPUTER NAVIGATION;  Surgeon: Rod Can, MD;  Location: [facility];  Service: Orthopedics;  Laterality: Right;  Needs RNFA  . KNEE  ARTHROPLASTY Left 02/14/2018   Procedure: LEFT TOTAL KNEE ARTHROPLASTY WITH COMPUTER NAVIGATION;  Surgeon: Rod Can, MD;  Location[facility];  Service: Orthopedics;  Laterality: Left;  Adductor Block  . REVERSE SHOULDER ARTHROPLASTY Left 03/01/2017   Procedure: REVERSE SHOULDER ARTHROPLASTY;  Surgeon: Justice Britain, MD;  Location: [facility];  Service: Orthopedics;  Laterality: Left;  . TUBAL LIGATION      There were no vitals filed for this visit.  Subjective Assessment - 02/22/18 0942    Subjective  Pt reports that she had to go to the hospital yesterday. Pt stated "I had blood coming from where I use the bathroom"     Currently in Pain?  Yes    Pain Score  4     Pain Location  Knee    Pain Orientation  Left                       [facility] Adult PT Treatment/Exercise - 02/22/18 0001      Exercises   Exercises  Knee/Hip      Knee/Hip Exercises: Aerobic   Nustep  L3 x 7 min       Knee/Hip Exercises: Standing   Other Standing Knee Exercises  small marches x10 HHA x2      Knee/Hip Exercises: Seated  Long Arc Quad  AROM;Left;10 reps;2 sets    Other Seated Knee/Hip Exercises  Quad sets LLE 2x10    Hamstring Curl  Left;2 sets;10 reps   red   Sit to Sand  2 sets;5 reps      Vasopneumatic   Number Minutes Vasopneumatic   10 minutes    Vasopnuematic Location   Knee    Vasopneumatic Pressure  Low;Medium    Vasopneumatic Temperature   34      Manual Therapy   Manual Therapy  Passive ROM    Passive ROM  passive ROM to the L  knee flexion               PT Short Term Goals - 02/20/18 0949      PT SHORT TERM GOAL #1   Title  independent iwth initial HEP    Time  2    Period  Weeks    Status  New        PT Long Term Goals - 02/20/18 0950      PT LONG TERM GOAL #1   Title  decrease TUG time to 27 seconds    Time  8    Period  Weeks    Status  New      PT LONG TERM GOAL #2   Title  increase AROM to 10-110 degrees flexion    Time  8    Period   Weeks    Status  New      PT LONG TERM GOAL #3   Title  walk with SPC in the home    Time  8    Period  Weeks    Status  New      PT LONG TERM GOAL #4   Title  be able to go up and down stairs with SBA, one at a time    Time  8    Period  Weeks    Status  New      PT LONG TERM GOAL #5   Title  decrease swelling 3 cm    Time  8    Period  Weeks    Status  New            Plan - 02/22/18 1025    Clinical Impression Statement  Pt did well with a slightly progressed treatment session. Pt was able to ind bend LLE to get out of the car with seat all the way back. Reports some pain at the end range of L knee flexion. Cues to establish a good L quad contraction. No issues with HS curls and LAQ.    Rehab Potential  Good    PT Frequency  3x / week    PT Treatment/Interventions  ADLs/Self Care Home Management;Cryotherapy;Gait training;Stair training;Functional mobility training;Therapeutic activities;Therapeutic exercise;Balance training;Neuromuscular re-education;Patient/family education;Vasopneumatic Device;Manual techniques;Electrical Stimulation    PT Next Visit Plan  start her moving, address pain as needed       Patient will benefit from skilled therapeutic intervention in order to improve the following deficits and impairments:  Abnormal gait, Decreased range of motion, Difficulty walking, Decreased endurance, Cardiopulmonary status limiting activity, Decreased activity tolerance, Pain, Impaired flexibility, Decreased scar mobility, Decreased balance, Decreased mobility, Decreased strength, Increased edema  Visit Diagnosis: Acute pain of left knee  Stiffness of left knee, not elsewhere classified  Difficulty in walking, not elsewhere classified  Localized edema     Problem List Patient Active Problem List   Diagnosis Date Noted  .  Osteoarthritis of left knee 02/14/2018  . Osteoarthritis of right knee 11/15/2017  . Humerus fracture 03/09/2017  . Asthma 03/09/2017   . Hypothyroidism 03/09/2017  . 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 Genetic testing 02/06/2017  . Acute respiratory failure with hypoxia ([place]) 06/07/2016  . RSV (acute bronchiolitis due to respiratory syncytial virus) 06/07/2016  . Anxiety 06/07/2016  . Arthritis   . Depression 05/30/2016  . Chronic kidney disease (CKD), stage IV (severe) ([place]) 05/30/2016  . Mild intermittent asthma with acute exacerbation   . Respiratory distress   . CKD (chronic kidney disease) stage 3, GFR 30-59 ml/min (HCC)   . Chronic pain syndrome   . Bronchitis 05/29/2016  . Left shoulder pain   . Opacity of lung on imaging study   . Shoulder pain, acute   . Weakness generalized 12/22/2015  . Essential hypertension 12/22/2015  . Pneumonia 12/22/2015  . PVC's (premature ventricular contractions) 12/22/2015  . CAP (community acquired pneumonia) 12/21/2015    Scot Jun, PTA 02/22/2018, 10:27 AM  [place], [place], [address] Phone: [phone]   Fax:  [phone]  Name: KINDRA BICKHAM MRN: [id number] Date of Birth: 08/13/1950

## 2018-02-25 ENCOUNTER — Encounter: Payer: Self-pay | Admitting: Physical Therapy

## 2018-02-25 ENCOUNTER — Ambulatory Visit: Payer: Medicare HMO | Admitting: Physical Therapy

## 2018-02-25 DIAGNOSIS — M170 Bilateral primary osteoarthritis of knee: Secondary | ICD-10-CM | POA: Diagnosis not present

## 2018-02-25 DIAGNOSIS — R262 Difficulty in walking, not elsewhere classified: Secondary | ICD-10-CM

## 2018-02-25 DIAGNOSIS — M25662 Stiffness of left knee, not elsewhere classified: Secondary | ICD-10-CM | POA: Diagnosis not present

## 2018-02-25 DIAGNOSIS — Z96612 Presence of left artificial shoulder joint: Secondary | ICD-10-CM | POA: Diagnosis not present

## 2018-02-25 DIAGNOSIS — M5136 Other intervertebral disc degeneration, lumbar region: Secondary | ICD-10-CM | POA: Diagnosis not present

## 2018-02-25 DIAGNOSIS — R600 Localized edema: Secondary | ICD-10-CM | POA: Diagnosis not present

## 2018-02-25 DIAGNOSIS — M25562 Pain in left knee: Secondary | ICD-10-CM | POA: Diagnosis not present

## 2018-02-25 DIAGNOSIS — N184 Chronic kidney disease, stage 4 (severe): Secondary | ICD-10-CM | POA: Diagnosis not present

## 2018-02-25 DIAGNOSIS — M1990 Unspecified osteoarthritis, unspecified site: Secondary | ICD-10-CM | POA: Diagnosis not present

## 2018-02-25 DIAGNOSIS — S42292D Other displaced fracture of upper end of left humerus, subsequent encounter for fracture with routine healing: Secondary | ICD-10-CM | POA: Diagnosis not present

## 2018-02-25 DIAGNOSIS — M5412 Radiculopathy, cervical region: Secondary | ICD-10-CM | POA: Diagnosis not present

## 2018-02-25 DIAGNOSIS — M9983 Other biomechanical lesions of lumbar region: Secondary | ICD-10-CM | POA: Diagnosis not present

## 2018-02-25 NOTE — Therapy (Signed)
[place], [place], [address] Phone: [phone]   Fax:  [phone]  Physical Therapy Treatment  Patient Details  Name: ANALESE SOVINE MRN: [id number] Date of Birth: 11-22-1950 Referring Provider (PT): Dr. Delfino Lovett   Encounter Date: 02/25/2018  PT End of Session - 02/25/18 1046    Visit Number  2    Date for PT Re-Evaluation  04/22/18    PT Start Time  1006    PT Stop Time  1100    PT Time Calculation (min)  54 min    Activity Tolerance  Patient limited by pain    Behavior During Therapy  [facility] for tasks assessed/performed;Anxious       Past Medical History:  Diagnosis Date  . Acute respiratory failure with hypoxia ([place]) 06/07/2016   RESOLVED   . Anemia    as a child  . Anxiety 06/07/2016  . Aortic atherosclerosis ([place])   . Arthritis   . Asthma    bronchitis  . CAP (community acquired pneumonia) 12/21/2015  . Cervical radiculopathy   . Chronic kidney disease (CKD), stage IV (severe) ([place]) 05/30/2016  . Chronic pain in left shoulder   . CKD (chronic kidney disease)    pt not aware of this  . CKD (chronic kidney disease) stage 3, GFR 30-59 ml/min (HCC)   . Depression   . Grade I diastolic dysfunction 29/52/8413   Noted on ECHO  . History of hiatal hernia 05/29/2016   Moderate, noted on CXR  . Humerus fracture 02/24/2017   Left  . Hypertension   . Hypothyroidism   . LVH (left ventricular hypertrophy) 12/23/2015   Mild, noted on ECHO  . Pneumonia   . Pre-diabetes   . PVC's (premature ventricular contractions) 12/22/2015  . 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 APPENDECTOMY    . KNEE ARTHROPLASTY Right 11/15/2017   Procedure: RIGHT TOTAL KNEE ARTHROPLASTY WITH COMPUTER NAVIGATION;  Surgeon: Rod Can, MD;  [facility];  Service: Orthopedics;  Laterality: Right;  Needs RNFA  . KNEE  ARTHROPLASTY Left 02/14/2018   Procedure: LEFT TOTAL KNEE ARTHROPLASTY WITH COMPUTER NAVIGATION;  Surgeon: Rod Can, MD;  [facility];  Service: Orthopedics;  Laterality: Left;  Adductor Block  . REVERSE SHOULDER ARTHROPLASTY Left 03/01/2017   Procedure: REVERSE SHOULDER ARTHROPLASTY;  Surgeon: Justice Britain, MD;  Location: [facility];  Service: Orthopedics;  Laterality: Left;  . TUBAL LIGATION      There were no vitals filed for this visit.  Subjective Assessment - 02/25/18 1008    Subjective  "I am doing a lot better"    Currently in Pain?  Yes    Pain Score  3     Pain Location  Knee    Pain Orientation  Left                       [facility] Adult PT Treatment/Exercise - 02/25/18 0001      Ambulation/Gait   Ambulation/Gait  Yes    Ambulation/Gait Assistance  5: Supervision    Ambulation Distance (Feet)  60 Feet    Assistive device  Straight cane    Gait Pattern  Step-through pattern;Decreased hip/knee flexion - left    Gait Comments  x2      Knee/Hip Exercises: Aerobic   Nustep  L3 x 7 min  Knee/Hip Exercises: Standing   Other Standing Knee Exercises  small marches x10 RW      Knee/Hip Exercises: Seated   Long Arc Quad  AROM;Left;10 reps;2 sets    Long Arc Quad Weight  2 lbs.    Other Seated Knee/Hip Exercises  Quad sets LLE 2x10    Hamstring Curl  Left;2 sets;10 reps    Hamstring Limitations  grd tband    Sit to Sand  5 reps;3 sets   low UBE      Vasopneumatic   Number Minutes Vasopneumatic   15 minutes    Vasopnuematic Location   Knee    Vasopneumatic Pressure  Low;Medium    Vasopneumatic Temperature   34      Manual Therapy   Manual Therapy  Passive ROM    Passive ROM  passive ROM to the L  knee flexion               PT Short Term Goals - 02/25/18 1050      PT SHORT TERM GOAL #1   Title  independent iwth initial HEP    Status  Achieved        PT Long Term Goals - 02/25/18 1050      PT LONG TERM GOAL #1   Title   decrease TUG time to 27 seconds    Status  On-going            Plan - 02/25/18 1046    Clinical Impression Statement  Progressed to gait with SPC and pt did well. Decrease hip and knee flexion with RLE. Pain reported at end range of MT. Good quat contraction remain with quad sets. Constant cues to keep LLE back with sit to stand. Pt will often compensate relying more on RLE    Rehab Potential  Good    PT Treatment/Interventions  ADLs/Self Care Home Management;Cryotherapy;Gait training;Stair training;Functional mobility training;Therapeutic activities;Therapeutic exercise;Balance training;Neuromuscular re-education;Patient/family education;Vasopneumatic Device;Manual techniques;Electrical Stimulation    PT Next Visit Plan  start her moving, address pain as needed       Patient will benefit from skilled therapeutic intervention in order to improve the following deficits and impairments:  Abnormal gait, Decreased range of motion, Difficulty walking, Decreased endurance, Cardiopulmonary status limiting activity, Decreased activity tolerance, Pain, Impaired flexibility, Decreased scar mobility, Decreased balance, Decreased mobility, Decreased strength, Increased edema  Visit Diagnosis: Acute pain of left knee  Stiffness of left knee, not elsewhere classified  Difficulty in walking, not elsewhere classified     Problem List Patient Active Problem List   Diagnosis Date Noted  . Osteoarthritis of left knee 02/14/2018  . Osteoarthritis of right knee 11/15/2017  . Humerus fracture 03/09/2017  . Asthma 03/09/2017  . Hypothyroidism 03/09/2017  . 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 Genetic testing 02/06/2017  . Acute respiratory failure with hypoxia ([place]) 06/07/2016  . RSV (acute bronchiolitis due to respiratory syncytial virus) 06/07/2016  . Anxiety 06/07/2016  . Arthritis   . Depression 05/30/2016  . Chronic kidney disease (CKD), stage IV (severe) ([place])  05/30/2016  . Mild intermittent asthma with acute exacerbation   . Respiratory distress   . CKD (chronic kidney disease) stage 3, GFR 30-59 ml/min (HCC)   . Chronic pain syndrome   . Bronchitis 05/29/2016  . Left shoulder pain   . Opacity of lung on imaging study   . Shoulder pain, acute   . Weakness generalized 12/22/2015  . Essential hypertension 12/22/2015  . Pneumonia 12/22/2015  .  PVC's (premature ventricular contractions) 12/22/2015  . CAP (community acquired pneumonia) 12/21/2015    Scot Jun, PTA 02/25/2018, 10:50 AM  [place], [place], [address] Phone: [phone]   Fax:  [phone]  Name: GIZELLE WHETSEL MRN: [id number] Date of Birth: 13-Jun-1950

## 2018-02-28 ENCOUNTER — Ambulatory Visit: Payer: Medicare HMO | Admitting: Physical Therapy

## 2018-02-28 ENCOUNTER — Encounter: Payer: Self-pay | Admitting: Physical Therapy

## 2018-02-28 DIAGNOSIS — R262 Difficulty in walking, not elsewhere classified: Secondary | ICD-10-CM

## 2018-02-28 DIAGNOSIS — M25562 Pain in left knee: Secondary | ICD-10-CM | POA: Diagnosis not present

## 2018-02-28 DIAGNOSIS — M25662 Stiffness of left knee, not elsewhere classified: Secondary | ICD-10-CM

## 2018-02-28 DIAGNOSIS — R600 Localized edema: Secondary | ICD-10-CM | POA: Diagnosis not present

## 2018-02-28 NOTE — Therapy (Signed)
[place], [place], [address] Phone: [phone]   Fax:  [phone]  Physical Therapy Treatment  Patient Details  Name: Cheryl Lane MRN: [id number] Date of Birth: [birth year] Referring Provider (PT): Dr. Delfino Lovett   Encounter Date: 02/28/2018  PT End of Session - 02/28/18 1516    Visit Number  3    Date for PT Re-Evaluation  04/22/18    PT Start Time  1430    PT Stop Time  9833    PT Time Calculation (min)  60 min    Activity Tolerance  Patient limited by pain;Patient tolerated treatment well    Behavior During Therapy  [facility] for tasks assessed/performed;Anxious       Past Medical History:  Diagnosis Date  . Acute respiratory failure with hypoxia ([place]) 06/07/2016   RESOLVED   . Anemia    as a child  . Anxiety 06/07/2016  . Aortic atherosclerosis ([place])   . Arthritis   . Asthma    bronchitis  . CAP (community acquired pneumonia) 12/21/2015  . Cervical radiculopathy   . Chronic kidney disease (CKD), stage IV (severe) ([place]) 05/30/2016  . Chronic pain in left shoulder   . CKD (chronic kidney disease)    pt not aware of this  . CKD (chronic kidney disease) stage 3, GFR 30-59 ml/min (HCC)   . Depression   . Grade I diastolic dysfunction 82/50/5397   Noted on ECHO  . History of hiatal hernia 05/29/2016   Moderate, noted on CXR  . Humerus fracture 02/24/2017   Left  . Hypertension   . Hypothyroidism   . LVH (left ventricular hypertrophy) 12/23/2015   Mild, noted on ECHO  . Pneumonia   . Pre-diabetes   . PVC's (premature ventricular contractions) 12/22/2015  . 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 APPENDECTOMY    . KNEE ARTHROPLASTY Right 11/15/2017   Procedure: RIGHT TOTAL KNEE ARTHROPLASTY WITH COMPUTER NAVIGATION;  Surgeon: Rod Can, MD;  [facility];  Service: Orthopedics;  Laterality:  Right;  Needs RNFA  . KNEE ARTHROPLASTY Left 02/14/2018   Procedure: LEFT TOTAL KNEE ARTHROPLASTY WITH COMPUTER NAVIGATION;  Surgeon: Rod Can, MD;  [facility];  Service: Orthopedics;  Laterality: Left;  Adductor Block  . REVERSE SHOULDER ARTHROPLASTY Left 03/01/2017   Procedure: REVERSE SHOULDER ARTHROPLASTY;  Surgeon: Justice Britain, MD;  Location: [facility];  Service: Orthopedics;  Laterality: Left;  . TUBAL LIGATION      There were no vitals filed for this visit.  Subjective Assessment - 02/28/18 1436    Subjective  "I am in just in a lot of pain on this knee today, but I am good"    Pain Score  4     Pain Location  Knee    Pain Orientation  Left         [facility] PT Assessment - 02/28/18 0001      AROM   Left Knee Extension  6    Left Knee Flexion  80                   [facility] Adult PT Treatment/Exercise - 02/28/18 0001      Knee/Hip Exercises: Aerobic   Nustep  L3 x 7 min       Knee/Hip Exercises: Standing   Other Standing Knee Exercises  small marches x5 HHA x2  Knee/Hip Exercises: Seated   Long Arc Quad  AROM;Left;10 reps;2 sets    Long Arc Quad Weight  2 lbs.    Other Seated Knee/Hip Exercises  Fitter presses 2 blue 1black LLE 2x15     Hamstring Curl  Left;2 sets;10 reps    Hamstring Limitations  grd tband    Sit to Sand  without UE support;10 reps;2 sets   Low UBE seat     Vasopneumatic   Number Minutes Vasopneumatic   15 minutes    Vasopnuematic Location   Knee    Vasopneumatic Pressure  Low;Medium    Vasopneumatic Temperature   34      Manual Therapy   Manual Therapy  Passive ROM    Passive ROM  passive ROM to the L  knee flexion               PT Short Term Goals - 02/25/18 1050      PT SHORT TERM GOAL #1   Title  independent iwth initial HEP    Status  Achieved        PT Long Term Goals - 02/25/18 1050      PT LONG TERM GOAL #1   Title  decrease TUG time to 27 seconds    Status  On-going            Plan  - 02/28/18 1517    Clinical Impression Statement  PT enters clinic reporting increase L knee pain and soreness today. Despite reports she has increased her L knee AROM. She did well with all exercises but reports pain when wt bearing on LLE. Some pain at the end rnge of passive knee flexion.    PT Treatment/Interventions  ADLs/Self Care Home Management;Cryotherapy;Gait training;Stair training;Functional mobility training;Therapeutic activities;Therapeutic exercise;Balance training;Neuromuscular re-education;Patient/family education;Vasopneumatic Device;Manual techniques;Electrical Stimulation    PT Next Visit Plan  L knee strength and ROM. Pt sees MD next week.       Patient will benefit from skilled therapeutic intervention in order to improve the following deficits and impairments:  Abnormal gait, Decreased range of motion, Difficulty walking, Decreased endurance, Cardiopulmonary status limiting activity, Decreased activity tolerance, Pain, Impaired flexibility, Decreased scar mobility, Decreased balance, Decreased mobility, Decreased strength, Increased edema  Visit Diagnosis: Acute pain of left knee  Stiffness of left knee, not elsewhere classified  Difficulty in walking, not elsewhere classified  Localized edema     Problem List Patient Active Problem List   Diagnosis Date Noted  . Osteoarthritis of left knee 02/14/2018  . Osteoarthritis of right knee 11/15/2017  . Humerus fracture 03/09/2017  . Asthma 03/09/2017  . Hypothyroidism 03/09/2017  . 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 Genetic testing 02/06/2017  . Acute respiratory failure with hypoxia ([place]) 06/07/2016  . RSV (acute bronchiolitis due to respiratory syncytial virus) 06/07/2016  . Anxiety 06/07/2016  . Arthritis   . Depression 05/30/2016  . Chronic kidney disease (CKD), stage IV (severe) ([place]) 05/30/2016  . Mild intermittent asthma with acute exacerbation   . Respiratory distress   .  CKD (chronic kidney disease) stage 3, GFR 30-59 ml/min (HCC)   . Chronic pain syndrome   . Bronchitis 05/29/2016  . Left shoulder pain   . Opacity of lung on imaging study   . Shoulder pain, acute   . Weakness generalized 12/22/2015  . Essential hypertension 12/22/2015  . Pneumonia 12/22/2015  . PVC's (premature ventricular contractions) 12/22/2015  . CAP (community acquired pneumonia) 12/21/2015    Jori Moll  Rex Kras, PTA 02/28/2018, 3:19 PM  [place], [place], [address] Phone: [phone]   Fax:  [phone]  Name: Cheryl Lane MRN: [id number] Date of Birth: 10/22/1950

## 2018-03-05 ENCOUNTER — Ambulatory Visit: Payer: Medicare HMO | Admitting: Physical Therapy

## 2018-03-07 ENCOUNTER — Ambulatory Visit: Payer: Medicare HMO | Admitting: Physical Therapy

## 2018-03-07 ENCOUNTER — Encounter: Payer: Self-pay | Admitting: Physical Therapy

## 2018-03-07 DIAGNOSIS — R262 Difficulty in walking, not elsewhere classified: Secondary | ICD-10-CM | POA: Diagnosis not present

## 2018-03-07 DIAGNOSIS — M25662 Stiffness of left knee, not elsewhere classified: Secondary | ICD-10-CM | POA: Diagnosis not present

## 2018-03-07 DIAGNOSIS — M25562 Pain in left knee: Secondary | ICD-10-CM

## 2018-03-07 DIAGNOSIS — R600 Localized edema: Secondary | ICD-10-CM

## 2018-03-07 NOTE — Therapy (Signed)
[address] [place], [place], [address] Phone: [phone]   Fax:  [phone]  Physical Therapy Treatment  Patient Details  Name: Cheryl Lane MRN: [id number] Date of Birth: [birth year] Referring Provider (PT): Dr. Delfino Lovett   Encounter Date: 03/07/2018  PT End of Session - 03/07/18 1010    Visit Number  4    Date for PT Re-Evaluation  04/22/18    PT Start Time  0928    PT Stop Time  1025    PT Time Calculation (min)  57 min    Activity Tolerance  Patient tolerated treatment well    Behavior During Therapy  [facility] for tasks assessed/performed       Past Medical History:  Diagnosis Date  . Acute respiratory failure with hypoxia ([place]) 06/07/2016   RESOLVED   . Anemia    as a child  . Anxiety 06/07/2016  . Aortic atherosclerosis ([place])   . Arthritis   . Asthma    bronchitis  . CAP (community acquired pneumonia) 12/21/2015  . Cervical radiculopathy   . Chronic kidney disease (CKD), stage IV (severe) ([place]) 05/30/2016  . Chronic pain in left shoulder   . CKD (chronic kidney disease)    pt not aware of this  . CKD (chronic kidney disease) stage 3, GFR 30-59 ml/min (HCC)   . Depression   . Grade I diastolic dysfunction 04/26/7251   Noted on ECHO  . History of hiatal hernia 05/29/2016   Moderate, noted on CXR  . Humerus fracture 02/24/2017   Left  . Hypertension   . Hypothyroidism   . LVH (left ventricular hypertrophy) 12/23/2015   Mild, noted on ECHO  . Pneumonia   . Pre-diabetes   . PVC's (premature ventricular contractions) 12/22/2015  . 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 APPENDECTOMY    . KNEE ARTHROPLASTY Right 11/15/2017   Procedure: RIGHT TOTAL KNEE ARTHROPLASTY WITH COMPUTER NAVIGATION;  Surgeon: Rod Can, MD;  Location: [facility];  Service: Orthopedics;  Laterality: Right;  Needs RNFA  . KNEE  ARTHROPLASTY Left 02/14/2018   Procedure: LEFT TOTAL KNEE ARTHROPLASTY WITH COMPUTER NAVIGATION;  Surgeon: Rod Can, MD;  Location: [facility];  Service: Orthopedics;  Laterality: Left;  Adductor Block  . REVERSE SHOULDER ARTHROPLASTY Left 03/01/2017   Procedure: REVERSE SHOULDER ARTHROPLASTY;  Surgeon: Justice Britain, MD;  Location: [facility];  Service: Orthopedics;  Laterality: Left;  . TUBAL LIGATION      There were no vitals filed for this visit.  Subjective Assessment - 03/07/18 0937    Subjective  Patient will see MD tomorrow to have the bandage removed    Currently in Pain?  Yes    Pain Score  4     Pain Location  Knee    Pain Orientation  Left    Aggravating Factors   bending                       [facility] Adult PT Treatment/Exercise - 03/07/18 0001      Knee/Hip Exercises: Aerobic   Nustep  L3 x 7 min       Knee/Hip Exercises: Machines for Strengthening   Cybex Knee Extension  5% both legs 3x10    Cybex Knee Flexion  20# 3x10      Knee/Hip Exercises: Standing   Gait Training  with SPC and HHA 2x100  feet    Other Standing Knee Exercises  6" toe touches using a SPC      Vasopneumatic   Number Minutes Vasopneumatic   10 minutes    Vasopnuematic Location   Knee    Vasopneumatic Pressure  Medium    Vasopneumatic Temperature   34      Manual Therapy   Manual Therapy  Passive ROM    Passive ROM  passive ROM to the L  knee flexion, some gentle contract relax               PT Short Term Goals - 02/25/18 1050      PT SHORT TERM GOAL #1   Title  independent iwth initial HEP    Status  Achieved        PT Long Term Goals - 03/07/18 1011      PT LONG TERM GOAL #3   Title  walk with SPC in the home    Status  On-going            Plan - 03/07/18 1010    Clinical Impression Statement  Patient reports that she is starting to feel better, having some issues with her BP and cancelled earlier in the week.  PROM is still very painful, she will  get bandage off tomorrow    PT Next Visit Plan  work on ROM and functional gait and confidence    Consulted and Agree with Plan of Care  Patient       Patient will benefit from skilled therapeutic intervention in order to improve the following deficits and impairments:  Abnormal gait, Decreased range of motion, Difficulty walking, Decreased endurance, Cardiopulmonary status limiting activity, Decreased activity tolerance, Pain, Impaired flexibility, Decreased scar mobility, Decreased balance, Decreased mobility, Decreased strength, Increased edema  Visit Diagnosis: Acute pain of left knee  Stiffness of left knee, not elsewhere classified  Difficulty in walking, not elsewhere classified  Localized edema     Problem List Patient Active Problem List   Diagnosis Date Noted  . Osteoarthritis of left knee 02/14/2018  . Osteoarthritis of right knee 11/15/2017  . Humerus fracture 03/09/2017  . Asthma 03/09/2017  . Hypothyroidism 03/09/2017  . 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 Genetic testing 02/06/2017  . Acute respiratory failure with hypoxia ([place]) 06/07/2016  . RSV (acute bronchiolitis due to respiratory syncytial virus) 06/07/2016  . Anxiety 06/07/2016  . Arthritis   . Depression 05/30/2016  . Chronic kidney disease (CKD), stage IV (severe) ([place]) 05/30/2016  . Mild intermittent asthma with acute exacerbation   . Respiratory distress   . CKD (chronic kidney disease) stage 3, GFR 30-59 ml/min (HCC)   . Chronic pain syndrome   . Bronchitis 05/29/2016  . Left shoulder pain   . Opacity of lung on imaging study   . Shoulder pain, acute   . Weakness generalized 12/22/2015  . Essential hypertension 12/22/2015  . Pneumonia 12/22/2015  . PVC's (premature ventricular contractions) 12/22/2015  . CAP (community acquired pneumonia) 12/21/2015    Sumner Boast., PT 03/07/2018, 10:12 AM  [place], [place], [address] Phone: [phone]   Fax:  [phone]  Name: Cheryl Lane MRN: [id number] Date of Birth: 1950-07-20

## 2018-03-08 DIAGNOSIS — Z96652 Presence of left artificial knee joint: Secondary | ICD-10-CM | POA: Diagnosis not present

## 2018-03-08 DIAGNOSIS — Z471 Aftercare following joint replacement surgery: Secondary | ICD-10-CM | POA: Diagnosis not present

## 2018-03-08 MED FILL — HYDROCODON-APAP 5-325: 5-325 | 7 days supply | Qty: 30 | Fill #0

## 2018-03-13 ENCOUNTER — Ambulatory Visit: Payer: Medicare HMO | Admitting: Physical Therapy

## 2018-03-18 ENCOUNTER — Ambulatory Visit: Payer: Medicare HMO | Admitting: Physical Therapy

## 2018-03-18 DIAGNOSIS — R600 Localized edema: Secondary | ICD-10-CM | POA: Diagnosis not present

## 2018-03-18 DIAGNOSIS — R262 Difficulty in walking, not elsewhere classified: Secondary | ICD-10-CM

## 2018-03-18 DIAGNOSIS — M25662 Stiffness of left knee, not elsewhere classified: Secondary | ICD-10-CM | POA: Diagnosis not present

## 2018-03-18 DIAGNOSIS — M25562 Pain in left knee: Secondary | ICD-10-CM | POA: Diagnosis not present

## 2018-03-18 NOTE — Therapy (Signed)
[place], [place], [address] Phone: [phone]   Fax:  [phone]  Physical Therapy Treatment  Patient Details  Name: Cheryl Lane MRN: [id number] Date of Birth: 19-Aug-1950 Referring Provider (PT): Dr. Delfino Lovett   Encounter Date: 03/18/2018  PT End of Session - 03/18/18 0928    Visit Number  5    Date for PT Re-Evaluation  04/22/18    Authorization Type  KX modifier    PT Start Time  0927    PT Stop Time  1027    PT Time Calculation (min)  60 min    Activity Tolerance  Patient tolerated treatment well    Behavior During Therapy  [facility] for tasks assessed/performed       Past Medical History:  Diagnosis Date  . Acute respiratory failure with hypoxia ([place]) 06/07/2016   RESOLVED   . Anemia    as a child  . Anxiety 06/07/2016  . Aortic atherosclerosis ([place])   . Arthritis   . Asthma    bronchitis  . CAP (community acquired pneumonia) 12/21/2015  . Cervical radiculopathy   . Chronic kidney disease (CKD), stage IV (severe) ([place]) 05/30/2016  . Chronic pain in left shoulder   . CKD (chronic kidney disease)    pt not aware of this  . CKD (chronic kidney disease) stage 3, GFR 30-59 ml/min (HCC)   . Depression   . Grade I diastolic dysfunction 32/44/0102   Noted on ECHO  . History of hiatal hernia 05/29/2016   Moderate, noted on CXR  . Humerus fracture 02/24/2017   Left  . Hypertension   . Hypothyroidism   . LVH (left ventricular hypertrophy) 12/23/2015   Mild, noted on ECHO  . Pneumonia   . Pre-diabetes   . PVC's (premature ventricular contractions) 12/22/2015  . 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 APPENDECTOMY    . KNEE ARTHROPLASTY Right 11/15/2017   Procedure: RIGHT TOTAL KNEE ARTHROPLASTY WITH COMPUTER NAVIGATION;  Surgeon: Rod Can, MD;  [facility];  Service: Orthopedics;  Laterality:  Right;  Needs RNFA  . KNEE ARTHROPLASTY Left 02/14/2018   Procedure: LEFT TOTAL KNEE ARTHROPLASTY WITH COMPUTER NAVIGATION;  Surgeon: Rod Can, MD;  [facility];  Service: Orthopedics;  Laterality: Left;  Adductor Block  . REVERSE SHOULDER ARTHROPLASTY Left 03/01/2017   Procedure: REVERSE SHOULDER ARTHROPLASTY;  Surgeon: Justice Britain, MD;  Location: [facility];  Service: Orthopedics;  Laterality: Left;  . TUBAL LIGATION      There were no vitals filed for this visit.  Subjective Assessment - 03/18/18 0929    Subjective  I'm a little stiff    Limitations  Lifting;Standing;Walking;House hold activities    Patient Stated Goals  walk without pain, have good ROM    Currently in Pain?  Yes    Pain Score  4     Pain Location  Knee    Pain Descriptors / Indicators  Aching;Sore         [facility] PT Assessment - 03/18/18 0001      AROM   Left Knee Flexion  80      PROM   Left Knee Flexion  95                   [facility] Adult PT Treatment/Exercise - 03/18/18 0001      Exercises   Exercises  Knee/Hip  Knee/Hip Exercises: Aerobic   Nustep  L4 x 7 min    moving seat up x 2 to 8      Knee/Hip Exercises: Machines for Strengthening   Cybex Knee Extension  5% both legs 3x10    Cybex Knee Flexion  20# 3x10      Knee/Hip Exercises: Standing   Gait Training  with SPC and HHA 2x100 feet    Other Standing Knee Exercises  6" toe touches using a SPC    Other Standing Knee Exercises  hip flexion 2x10 bil    against wall     Knee/Hip Exercises: Supine   Straight Leg Raises  Strengthening;Left;10 reps   good quad control     Modalities   Modalities  Vasopneumatic      Vasopneumatic   Number Minutes Vasopneumatic   15 minutes    Vasopnuematic Location   Knee    Vasopneumatic Pressure  Medium    Vasopneumatic Temperature   34      Manual Therapy   Manual Therapy  Soft tissue mobilization;Passive ROM    Soft tissue mobilization  to left lateral quads and ITB     Passive ROM  to Left knee into flexion supine and seated               PT Short Term Goals - 02/25/18 1050      PT SHORT TERM GOAL #1   Title  independent iwth initial HEP    Status  Achieved        PT Long Term Goals - 03/07/18 1011      PT LONG TERM GOAL #3   Title  walk with SPC in the home    Status  On-going            Plan - 03/18/18 1035    Clinical Impression Statement  Patient did well today. She experienced some LOB with ambulation which may have been due to her not eating this morning. She requires VCs to use heel strike on LLE with gait. Her BP was 135/80 at end of session. She is progressing with left knee flexion which measured 80 and 95 deg actively/passively.    PT Frequency  3x / week    PT Duration  8 weeks    PT Treatment/Interventions  ADLs/Self Care Home Management;Cryotherapy;Gait training;Stair training;Functional mobility training;Therapeutic activities;Therapeutic exercise;Balance training;Neuromuscular re-education;Patient/family education;Vasopneumatic Device;Manual techniques;Electrical Stimulation    PT Next Visit Plan  work on ROM and functional gait and confidence    Consulted and Agree with Plan of Care  Patient       Patient will benefit from skilled therapeutic intervention in order to improve the following deficits and impairments:  Abnormal gait, Decreased range of motion, Difficulty walking, Decreased endurance, Cardiopulmonary status limiting activity, Decreased activity tolerance, Pain, Impaired flexibility, Decreased scar mobility, Decreased balance, Decreased mobility, Decreased strength, Increased edema  Visit Diagnosis: Acute pain of left knee  Stiffness of left knee, not elsewhere classified  Difficulty in walking, not elsewhere classified  Localized edema     Problem List Patient Active Problem List   Diagnosis Date Noted  . Osteoarthritis of left knee 02/14/2018  . Osteoarthritis of right knee 11/15/2017  .  Humerus fracture 03/09/2017  . Asthma 03/09/2017  . Hypothyroidism 03/09/2017  . 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 Genetic testing 02/06/2017  . Acute respiratory failure with hypoxia ([place]) 06/07/2016  . RSV (acute bronchiolitis due to respiratory syncytial virus) 06/07/2016  .  Anxiety 06/07/2016  . Arthritis   . Depression 05/30/2016  . Chronic kidney disease (CKD), stage IV (severe) ([place]) 05/30/2016  . Mild intermittent asthma with acute exacerbation   . Respiratory distress   . CKD (chronic kidney disease) stage 3, GFR 30-59 ml/min (HCC)   . Chronic pain syndrome   . Bronchitis 05/29/2016  . Left shoulder pain   . Opacity of lung on imaging study   . Shoulder pain, acute   . Weakness generalized 12/22/2015  . Essential hypertension 12/22/2015  . Pneumonia 12/22/2015  . PVC's (premature ventricular contractions) 12/22/2015  . CAP (community acquired pneumonia) 12/21/2015    Garner Dullea PT 03/18/2018, 10:49 AM  [place], [place], [address] Phone: [phone]   Fax:  [phone]  Name: Cheryl Lane MRN: [id number] Date of Birth: [birth year]

## 2018-03-20 ENCOUNTER — Encounter: Payer: Medicare HMO | Admitting: Physical Therapy

## 2018-03-26 ENCOUNTER — Ambulatory Visit: Payer: Medicare HMO | Attending: Orthopedic Surgery | Admitting: Physical Therapy

## 2018-03-26 ENCOUNTER — Encounter: Payer: Self-pay | Admitting: Physical Therapy

## 2018-03-26 DIAGNOSIS — M25662 Stiffness of left knee, not elsewhere classified: Secondary | ICD-10-CM | POA: Diagnosis not present

## 2018-03-26 DIAGNOSIS — R262 Difficulty in walking, not elsewhere classified: Secondary | ICD-10-CM | POA: Diagnosis not present

## 2018-03-26 DIAGNOSIS — M25562 Pain in left knee: Secondary | ICD-10-CM | POA: Diagnosis not present

## 2018-03-26 DIAGNOSIS — R600 Localized edema: Secondary | ICD-10-CM | POA: Diagnosis not present

## 2018-03-26 NOTE — Therapy (Signed)
[place], [place], [address] Phone: [phone]   Fax:  [phone]  Physical Therapy Treatment  Patient Details  Name: Cheryl Lane MRN: [id number] Date of Birth: 01-26-1951 Referring Provider (PT): Dr. Delfino Lovett   Encounter Date: 03/26/2018  PT End of Session - 03/26/18 1018    Visit Number  6    Date for PT Re-Evaluation  04/22/18    Authorization Type  KX modifier    PT Start Time  0927    PT Stop Time  1031    PT Time Calculation (min)  64 min    Activity Tolerance  Patient tolerated treatment well    Behavior During Therapy  [facility] for tasks assessed/performed       Past Medical History:  Diagnosis Date  . Acute respiratory failure with hypoxia ([place]) 06/07/2016   RESOLVED   . Anemia    as a child  . Anxiety 06/07/2016  . Aortic atherosclerosis ([place])   . Arthritis   . Asthma    bronchitis  . CAP (community acquired pneumonia) 12/21/2015  . Cervical radiculopathy   . Chronic kidney disease (CKD), stage IV (severe) ([place]) 05/30/2016  . Chronic pain in left shoulder   . CKD (chronic kidney disease)    pt not aware of this  . CKD (chronic kidney disease) stage 3, GFR 30-59 ml/min (HCC)   . Depression   . Grade I diastolic dysfunction 94/85/4627   Noted on ECHO  . History of hiatal hernia 05/29/2016   Moderate, noted on CXR  . Humerus fracture 02/24/2017   Left  . Hypertension   . Hypothyroidism   . LVH (left ventricular hypertrophy) 12/23/2015   Mild, noted on ECHO  . Pneumonia   . Pre-diabetes   . PVC's (premature ventricular contractions) 12/22/2015  . 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 APPENDECTOMY    . KNEE ARTHROPLASTY Right 11/15/2017   Procedure: RIGHT TOTAL KNEE ARTHROPLASTY WITH COMPUTER NAVIGATION;  Surgeon: Rod Can, MD;  [facility];  Service: Orthopedics;  Laterality:  Right;  Needs RNFA  . KNEE ARTHROPLASTY Left 02/14/2018   Procedure: LEFT TOTAL KNEE ARTHROPLASTY WITH COMPUTER NAVIGATION;  Surgeon: Rod Can, MD;  [facility];  Service: Orthopedics;  Laterality: Left;  Adductor Block  . REVERSE SHOULDER ARTHROPLASTY Left 03/01/2017   Procedure: REVERSE SHOULDER ARTHROPLASTY;  Surgeon: Justice Britain, MD;  Location: [facility];  Service: Orthopedics;  Laterality: Left;  . TUBAL LIGATION      There were no vitals filed for this visit.  Subjective Assessment - 03/26/18 0932    Subjective  "Im just great, a little stiff"    Currently in Pain?  Yes    Pain Score  2     Pain Location  Knee    Pain Orientation  Left         [facility] PT Assessment - 03/26/18 0001      AROM   Left Knee Flexion  83                   [facility] Adult PT Treatment/Exercise - 03/26/18 0001      Knee/Hip Exercises: Aerobic   Nustep  L4 x 7 min       Knee/Hip Exercises: Machines for Strengthening   Cybex Knee Extension  5lb 3x10     Cybex Knee Flexion  25# 3x10  Knee/Hip Exercises: Standing   Forward Step Up  Left;2 sets;10 reps;5 reps;Hand Hold: 1;Step Height: 4"    Gait Training  no AD 3x16ft     Other Standing Knee Exercises  L hup and knee flexion 2x10      Knee/Hip Exercises: Seated   Long Arc Quad  AROM;Left;10 reps;2 sets    Long Arc Quad Weight  2 lbs.    Hamstring Curl  Left;2 sets;15 reps    Hamstring Limitations  green     Sit to Sand  2 sets;10 reps;without UE support      Modalities   Modalities  Vasopneumatic      Vasopneumatic   Number Minutes Vasopneumatic   15 minutes    Vasopnuematic Location   Knee    Vasopneumatic Pressure  Medium    Vasopneumatic Temperature   34      Manual Therapy   Manual Therapy  Soft tissue mobilization;Passive ROM    Passive ROM  L knee PROM               PT Short Term Goals - 02/25/18 1050      PT SHORT TERM GOAL #1   Title  independent iwth initial HEP    Status  Achieved         PT Long Term Goals - 03/07/18 1011      PT LONG TERM GOAL #3   Title  walk with SPC in the home    Status  On-going            Plan - 03/26/18 1018    Clinical Impression Statement  Pt reports going to church and ambulating in without AD. Ambulated around clinic today's between machines without AD. L foot tends to externally rotate when ambulating. Cues not to circumduct when stepping up on 4 in box. Slight increase in active L knee flexion.     Rehab Potential  Good    PT Frequency  3x / week    PT Duration  8 weeks    PT Treatment/Interventions  ADLs/Self Care Home Management;Cryotherapy;Gait training;Stair training;Functional mobility training;Therapeutic activities;Therapeutic exercise;Balance training;Neuromuscular re-education;Patient/family education;Vasopneumatic Device;Manual techniques;Electrical Stimulation    PT Next Visit Plan  work on ROM and functional gait and confidence       Patient will benefit from skilled therapeutic intervention in order to improve the following deficits and impairments:  Abnormal gait, Decreased range of motion, Difficulty walking, Decreased endurance, Cardiopulmonary status limiting activity, Decreased activity tolerance, Pain, Impaired flexibility, Decreased scar mobility, Decreased balance, Decreased mobility, Decreased strength, Increased edema  Visit Diagnosis: Acute pain of left knee  Stiffness of left knee, not elsewhere classified  Difficulty in walking, not elsewhere classified  Localized edema     Problem List Patient Active Problem List   Diagnosis Date Noted  . Osteoarthritis of left knee 02/14/2018  . Osteoarthritis of right knee 11/15/2017  . Humerus fracture 03/09/2017  . Asthma 03/09/2017  . Hypothyroidism 03/09/2017  . 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 Genetic testing 02/06/2017  . Acute respiratory failure with hypoxia ([place]) 06/07/2016  . RSV (acute bronchiolitis due to  respiratory syncytial virus) 06/07/2016  . Anxiety 06/07/2016  . Arthritis   . Depression 05/30/2016  . Chronic kidney disease (CKD), stage IV (severe) ([place]) 05/30/2016  . Mild intermittent asthma with acute exacerbation   . Respiratory distress   . CKD (chronic kidney disease) stage 3, GFR 30-59 ml/min (HCC)   . Chronic pain syndrome   .  Bronchitis 05/29/2016  . Left shoulder pain   . Opacity of lung on imaging study   . Shoulder pain, acute   . Weakness generalized 12/22/2015  . Essential hypertension 12/22/2015  . Pneumonia 12/22/2015  . PVC's (premature ventricular contractions) 12/22/2015  . CAP (community acquired pneumonia) 12/21/2015    Scot Jun,  PTA 03/26/2018, 10:40 AM  [place], [place], [address] Phone: [phone]   Fax:  [phone]  Name: Cheryl Lane MRN: [id number] Date of Birth: 10/08/1950

## 2018-03-27 DIAGNOSIS — S42292D Other displaced fracture of upper end of left humerus, subsequent encounter for fracture with routine healing: Secondary | ICD-10-CM | POA: Diagnosis not present

## 2018-03-27 DIAGNOSIS — Z96612 Presence of left artificial shoulder joint: Secondary | ICD-10-CM | POA: Diagnosis not present

## 2018-03-27 DIAGNOSIS — M5412 Radiculopathy, cervical region: Secondary | ICD-10-CM | POA: Diagnosis not present

## 2018-03-27 DIAGNOSIS — M1990 Unspecified osteoarthritis, unspecified site: Secondary | ICD-10-CM | POA: Diagnosis not present

## 2018-03-27 DIAGNOSIS — M5136 Other intervertebral disc degeneration, lumbar region: Secondary | ICD-10-CM | POA: Diagnosis not present

## 2018-03-27 DIAGNOSIS — N184 Chronic kidney disease, stage 4 (severe): Secondary | ICD-10-CM | POA: Diagnosis not present

## 2018-03-27 DIAGNOSIS — M9983 Other biomechanical lesions of lumbar region: Secondary | ICD-10-CM | POA: Diagnosis not present

## 2018-03-27 DIAGNOSIS — M170 Bilateral primary osteoarthritis of knee: Secondary | ICD-10-CM | POA: Diagnosis not present

## 2018-03-28 ENCOUNTER — Ambulatory Visit: Payer: Medicare HMO | Admitting: Physical Therapy

## 2018-04-09 DIAGNOSIS — Z471 Aftercare following joint replacement surgery: Secondary | ICD-10-CM | POA: Diagnosis not present

## 2018-04-09 DIAGNOSIS — Z96652 Presence of left artificial knee joint: Secondary | ICD-10-CM | POA: Diagnosis not present

## 2018-04-10 MED FILL — HYDROCODON-APAP 5-325: 5-325 | 7 days supply | Qty: 30 | Fill #0

## 2018-04-27 DIAGNOSIS — S42292D Other displaced fracture of upper end of left humerus, subsequent encounter for fracture with routine healing: Secondary | ICD-10-CM | POA: Diagnosis not present

## 2018-04-27 DIAGNOSIS — M5136 Other intervertebral disc degeneration, lumbar region: Secondary | ICD-10-CM | POA: Diagnosis not present

## 2018-04-27 DIAGNOSIS — M1990 Unspecified osteoarthritis, unspecified site: Secondary | ICD-10-CM | POA: Diagnosis not present

## 2018-04-27 DIAGNOSIS — N184 Chronic kidney disease, stage 4 (severe): Secondary | ICD-10-CM | POA: Diagnosis not present

## 2018-04-27 DIAGNOSIS — M9983 Other biomechanical lesions of lumbar region: Secondary | ICD-10-CM | POA: Diagnosis not present

## 2018-04-27 DIAGNOSIS — M5412 Radiculopathy, cervical region: Secondary | ICD-10-CM | POA: Diagnosis not present

## 2018-04-27 DIAGNOSIS — M170 Bilateral primary osteoarthritis of knee: Secondary | ICD-10-CM | POA: Diagnosis not present

## 2018-04-27 DIAGNOSIS — Z96612 Presence of left artificial shoulder joint: Secondary | ICD-10-CM | POA: Diagnosis not present

## 2018-06-11 DIAGNOSIS — F329 Major depressive disorder, single episode, unspecified: Secondary | ICD-10-CM | POA: Diagnosis not present

## 2018-06-11 DIAGNOSIS — Z0000 Encounter for general adult medical examination without abnormal findings: Secondary | ICD-10-CM | POA: Diagnosis not present

## 2018-06-11 DIAGNOSIS — L508 Other urticaria: Secondary | ICD-10-CM | POA: Diagnosis not present

## 2018-06-11 DIAGNOSIS — E785 Hyperlipidemia, unspecified: Secondary | ICD-10-CM | POA: Diagnosis not present

## 2018-06-11 DIAGNOSIS — Z1231 Encounter for screening mammogram for malignant neoplasm of breast: Secondary | ICD-10-CM | POA: Diagnosis not present

## 2018-06-11 DIAGNOSIS — Z9181 History of falling: Secondary | ICD-10-CM | POA: Diagnosis not present

## 2018-06-11 DIAGNOSIS — Z136 Encounter for screening for cardiovascular disorders: Secondary | ICD-10-CM | POA: Diagnosis not present

## 2018-06-11 DIAGNOSIS — Z1331 Encounter for screening for depression: Secondary | ICD-10-CM | POA: Diagnosis not present

## 2018-06-11 DIAGNOSIS — Z1211 Encounter for screening for malignant neoplasm of colon: Secondary | ICD-10-CM | POA: Diagnosis not present

## 2018-08-13 DIAGNOSIS — R600 Localized edema: Secondary | ICD-10-CM | POA: Diagnosis not present

## 2018-08-13 DIAGNOSIS — E039 Hypothyroidism, unspecified: Secondary | ICD-10-CM | POA: Diagnosis not present

## 2018-08-13 DIAGNOSIS — Z1231 Encounter for screening mammogram for malignant neoplasm of breast: Secondary | ICD-10-CM | POA: Diagnosis not present

## 2018-08-13 DIAGNOSIS — E785 Hyperlipidemia, unspecified: Secondary | ICD-10-CM | POA: Diagnosis not present

## 2018-08-13 DIAGNOSIS — I129 Hypertensive chronic kidney disease with stage 1 through stage 4 chronic kidney disease, or unspecified chronic kidney disease: Secondary | ICD-10-CM | POA: Diagnosis not present

## 2018-08-13 DIAGNOSIS — N183 Chronic kidney disease, stage 3 (moderate): Secondary | ICD-10-CM | POA: Diagnosis not present

## 2018-08-13 DIAGNOSIS — E1169 Type 2 diabetes mellitus with other specified complication: Secondary | ICD-10-CM | POA: Diagnosis not present

## 2018-08-13 DIAGNOSIS — F329 Major depressive disorder, single episode, unspecified: Secondary | ICD-10-CM | POA: Diagnosis not present

## 2018-10-08 ENCOUNTER — Emergency Department (HOSPITAL_BASED_OUTPATIENT_CLINIC_OR_DEPARTMENT_OTHER): Payer: Medicare HMO

## 2018-10-08 ENCOUNTER — Emergency Department (HOSPITAL_BASED_OUTPATIENT_CLINIC_OR_DEPARTMENT_OTHER)
Admission: EM | Admit: 2018-10-08 | Discharge: 2018-10-08 | Disposition: A | Discharge status: Home / Self Care | Payer: Medicare HMO | Attending: Emergency Medicine | Admitting: Emergency Medicine

## 2018-10-08 ENCOUNTER — Encounter (HOSPITAL_BASED_OUTPATIENT_CLINIC_OR_DEPARTMENT_OTHER): Payer: Self-pay | Admitting: *Deleted

## 2018-10-08 ENCOUNTER — Other Ambulatory Visit: Payer: Self-pay

## 2018-10-08 DIAGNOSIS — I129 Hypertensive chronic kidney disease with stage 1 through stage 4 chronic kidney disease, or unspecified chronic kidney disease: Secondary | ICD-10-CM | POA: Diagnosis not present

## 2018-10-08 DIAGNOSIS — Z79899 Other long term (current) drug therapy: Secondary | ICD-10-CM | POA: Insufficient documentation

## 2018-10-08 DIAGNOSIS — N183 Chronic kidney disease, stage 3 (moderate): Secondary | ICD-10-CM | POA: Insufficient documentation

## 2018-10-08 DIAGNOSIS — M5441 Lumbago with sciatica, right side: Secondary | ICD-10-CM | POA: Diagnosis not present

## 2018-10-08 DIAGNOSIS — M545 Low back pain: Secondary | ICD-10-CM | POA: Diagnosis not present

## 2018-10-08 MED ORDER — HYDROMORPHONE HCL 1 MG/ML IJ SOLN
1.0000 mg | Freq: Once | INTRAMUSCULAR | Status: AC
  Administered 2018-10-08: 17:00:00 1 mg via INTRAMUSCULAR
  Filled 2018-10-08: qty 1

## 2018-10-08 MED ORDER — IBUPROFEN 400 MG PO TABS
400.0000 mg | ORAL_TABLET | Freq: Once | ORAL | Status: AC
  Administered 2018-10-08: 400 mg via ORAL
  Filled 2018-10-08: qty 1

## 2018-10-08 MED ORDER — DEXAMETHASONE 4 MG PO TABS
8.0000 mg | ORAL_TABLET | Freq: Once | ORAL | Status: AC
  Administered 2018-10-08: 8 mg via ORAL
  Filled 2018-10-08: qty 2

## 2018-10-08 MED ORDER — PREDNISONE 20 MG PO TABS: 20.0000 mg | ORAL_TABLET | Freq: Every day | ORAL | 0 refills | Status: DC

## 2018-10-08 MED ORDER — PREDNISONE 20 MG PO TABS
40.0000 mg | ORAL_TABLET | Freq: Once | ORAL | Status: AC
  Administered 2018-10-08: 17:00:00 40 mg via ORAL
  Filled 2018-10-08: qty 2

## 2018-10-08 MED ORDER — GABAPENTIN 300 MG PO CAPS: 300.0000 mg | ORAL_CAPSULE | Freq: Four times a day (QID) | ORAL | 0 refills | Status: AC

## 2018-10-08 MED ORDER — HYDROCODONE-ACETAMINOPHEN 5-325 MG PO TABS: 2.0000 | ORAL_TABLET | Freq: Four times a day (QID) | ORAL | 0 refills | Status: AC | PRN

## 2018-10-08 NOTE — ED Triage Notes (Signed)
Lower back pain on and off for a month. Pain has recently started going down her right leg.

## 2018-10-08 NOTE — ED Notes (Signed)
ED Provider at bedside. Dr Wilson Singer

## 2018-10-08 NOTE — ED Notes (Signed)
Pt teaching provided on medications that may cause drowsiness. Pt instructed not to drive or operate heavy machinery while taking the prescribed medication. Pt verbalized understanding.   

## 2018-10-09 NOTE — ED Provider Notes (Signed)
[facility] EMERGENCY DEPARTMENT Provider Note   CSN: [id number] Arrival date & time: 10/08/18  1619     History   Chief Complaint Chief Complaint  Patient presents with  . Back Pain    HPI [place] is a 68 y.o. female.     HPI   68yf with back pain. Atraumatic. Lower back with radiation into RLE. Worse with movement. Hasn't tried taking anything for symptoms. No urinary complaints. Previous   Past Medical History:  Diagnosis Date  . Acute respiratory failure with hypoxia ([place]) 06/07/2016   RESOLVED   . Anemia    as a child  . Anxiety 06/07/2016  . Aortic atherosclerosis ([place])   . Arthritis   . Asthma    bronchitis  . CAP (community acquired pneumonia) 12/21/2015  . Cervical radiculopathy   . Chronic kidney disease (CKD), stage IV (severe) ([place]) 05/30/2016  . Chronic pain in left shoulder   . CKD (chronic kidney disease)    pt not aware of this  . CKD (chronic kidney disease) stage 3, GFR 30-59 ml/min (HCC)   . Depression   . Grade I diastolic dysfunction 40/98/1191   Noted on ECHO  . History of hiatal hernia 05/29/2016   Moderate, noted on CXR  . Humerus fracture 02/24/2017   Left  . Hypertension   . Hypothyroidism   . LVH (left ventricular hypertrophy) 12/23/2015   Mild, noted on ECHO  . Pneumonia   . Pre-diabetes   . PVC's (premature ventricular contractions) 12/22/2015  . 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 Osteoarthritis of left knee 02/14/2018  . Osteoarthritis of right knee 11/15/2017  . Humerus fracture 03/09/2017  . Asthma 03/09/2017  . Hypothyroidism 03/09/2017  . 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 Genetic testing 02/06/2017  . Acute respiratory failure with hypoxia ([place]) 06/07/2016  . RSV (acute bronchiolitis due to respiratory syncytial virus) 06/07/2016  . Anxiety 06/07/2016  . Arthritis   . Depression  05/30/2016  . Chronic kidney disease (CKD), stage IV (severe) ([place]) 05/30/2016  . Mild intermittent asthma with acute exacerbation   . Respiratory distress   . CKD (chronic kidney disease) stage 3, GFR 30-59 ml/min (HCC)   . Chronic pain syndrome   . Bronchitis 05/29/2016  . Left shoulder pain   . Opacity of lung on imaging study   . Shoulder pain, acute   . Weakness generalized 12/22/2015  . Essential hypertension 12/22/2015  . Pneumonia 12/22/2015  . PVC's (premature ventricular contractions) 12/22/2015  . CAP (community acquired pneumonia) 12/21/2015    Past Surgical History:  Procedure Laterality Date  . APPENDECTOMY    . KNEE ARTHROPLASTY Right 11/15/2017   Procedure: RIGHT TOTAL KNEE ARTHROPLASTY WITH COMPUTER NAVIGATION;  Surgeon: Rod Can, MD;  Location: [facility];  Service: Orthopedics;  Laterality: Right;  Needs RNFA  . KNEE ARTHROPLASTY Left 02/14/2018   Procedure: LEFT TOTAL KNEE ARTHROPLASTY WITH COMPUTER NAVIGATION;  Surgeon: Rod Can, MD;  Location: [facility];  Service: Orthopedics;  Laterality: Left;  Adductor Block  . REVERSE SHOULDER ARTHROPLASTY Left 03/01/2017   Procedure: REVERSE SHOULDER ARTHROPLASTY;  Surgeon: Justice Britain, MD;  Location: [facility];  Service: Orthopedics;  Laterality: Left;  . TUBAL LIGATION       OB History   No obstetric history on file.      Home Medications    Prior to Admission medications  Medication Sig Start Date End Date Taking? Authorizing Provider  docusate sodium (COLACE) 100 MG capsule Take 1 capsule (100 mg total) by mouth 2 (two) times daily. Patient taking differently: Take 100 mg by mouth daily.  11/16/17  Yes Swinteck, Aaron Edelman, MD  escitalopram (LEXAPRO) 10 MG tablet Take 10 mg daily by mouth.   Yes [provider]  levothyroxine (SYNTHROID, LEVOTHROID) 100 MCG tablet Take 100 mcg by mouth daily before breakfast.  01/02/18  Yes [provider]  lisinopril (PRINIVIL,ZESTRIL) 20 MG tablet  01/02/18   Yes [provider]  pravastatin (PRAVACHOL) 20 MG tablet Take 20 mg by mouth 2 (two) times a week. Twice a week   Yes [provider]  aspirin 81 MG chewable tablet Chew 1 tablet (81 mg total) by mouth 2 (two) times daily. Patient not taking: Reported on 02/04/2018 11/16/17   Rod Can, MD  aspirin EC 81 MG tablet Take 81 mg by mouth daily.    [provider]  gabapentin (NEURONTIN) 300 MG capsule Take 1 capsule (300 mg total) by mouth 4 (four) times daily. 10/08/18   Virgel Manifold, MD  HYDROcodone-acetaminophen (NORCO/VICODIN) 5-325 MG tablet Take 2 tablets by mouth every 6 (six) hours as needed. 10/08/18   Virgel Manifold, MD  hydrocortisone (ANUSOL-HC) 2.5 % rectal cream Apply rectally 2 times daily 02/21/18   Couture, Cortni S, PA-C  lisinopril (PRINIVIL,ZESTRIL) 10 MG tablet Take 2 tablets (20 mg total) by mouth daily. 06/03/16   Ghimire, Henreitta Leber, MD  methocarbamol (ROBAXIN) 500 MG tablet Take 1 tablet (500 mg total) by mouth every 6 (six) hours as needed for muscle spasms. 02/15/18   Swinteck, Aaron Edelman, MD  ondansetron (ZOFRAN) 4 MG tablet Take 1 tablet (4 mg total) by mouth every 6 (six) hours as needed for nausea. 02/15/18   Swinteck, Aaron Edelman, MD  polyethylene glycol [facility]) packet Take 17 g by mouth daily. Dissolve one cap full in solution (water, gatorade, etc.) and administer once cap-full daily. You may titrate up daily by 1 cap-full until the patient is having pudding consistency of stools. After the patient is able to start passing softer stools they will need to be on 1/2 cap-full daily for 2 weeks. 02/21/18   Couture, Cortni S, PA-C  predniSONE (DELTASONE) 20 MG tablet Take 1 tablet (20 mg total) by mouth daily. 10/08/18   Virgel Manifold, MD  senna (SENOKOT) 8.6 MG TABS tablet Take 1 tablet (8.6 mg total) by mouth 2 (two) times daily. Patient taking differently: Take 1 tablet by mouth daily as needed for moderate constipation.  11/16/17   Rod Can, MD     Family History Family History  Problem Relation Age of Onset  . Hypertension Other   . Breast cancer Mother 40  . Breast cancer Sister 56  . Lung cancer Brother   . Cancer Sister 76       "female" cancer  . Cancer Sister        unknown cancer  . Breast cancer Sister 40    Social History Social History   Tobacco Use  . Smoking status: Never Smoker  . Smokeless tobacco: Never Used  Substance Use Topics  . Alcohol use: No  . Drug use: No     Allergies   Patient has no known allergies.   Review of Systems Review of Systems  All systems reviewed and negative, other than as noted in HPI.  Physical Exam Updated Vital Signs BP (!) 175/80 (BP Location: Right Arm)  Pulse 75   Temp 98.5 F (36.9 C) (Oral)   Resp 16   Ht 5\' 4"  (1.626 m)   Wt 122.9 kg   SpO2 96%   BMI 46.52 kg/m   Physical Exam Vitals signs and nursing note reviewed.  Constitutional:      General: She is not in acute distress.    Appearance: She is well-developed.  HENT:     Head: Normocephalic and atraumatic.  Eyes:     General:        Right eye: No discharge.        Left eye: No discharge.     Conjunctiva/sclera: Conjunctivae normal.  Neck:     Musculoskeletal: Neck supple.  Cardiovascular:     Rate and Rhythm: Normal rate and regular rhythm.     Heart sounds: Normal heart sounds. No murmur. No friction rub. No gallop.   Pulmonary:     Effort: Pulmonary effort is normal. No respiratory distress.     Breath sounds: Normal breath sounds.  Abdominal:     General: There is no distension.     Palpations: Abdomen is soft.     Tenderness: There is no abdominal tenderness.  Musculoskeletal:        General: No tenderness.     Comments: TTP to lower back and into R buttock. Positive straight leg test. Movement in RLE limited by pain but doesn't seem to have true deficit. Sensation intact to light touch.   Skin:    General: Skin is warm and dry.  Neurological:     Mental Status: She is  alert.  Psychiatric:        Behavior: Behavior normal.        Thought Content: Thought content normal.    ED Treatments / Results  Labs (all labs ordered are listed, but only abnormal results are displayed) Labs Reviewed - No data to display  EKG    Radiology Dg Lumbar Spine Complete  Result Date: 10/08/2018 CLINICAL DATA:  Intermittent low back pain for months now extending down the right leg. EXAM: LUMBAR SPINE - COMPLETE 4+ VIEW COMPARISON:  02/21/2018 FINDINGS: 11 degrees of levoconvex lumbar scoliosis as measured between T12 and L3. There is considerable loss of intervertebral disc height at all levels between L1 and L5 with 4 mm of degenerative anterolisthesis at L4-5. No definite pars defects. No compression fracture is identified. Intervertebral spurring is noted at all levels between L2 and L5. IMPRESSION: 1. Considerable lumbar spondylosis and degenerative disc disease with multilevel loss of disc height and spurring. 2. Grade 1 degenerative anterolisthesis at L4-5, not appreciably changed from 02/21/2018. 3. Mild levoconvex upper lumbar scoliosis. Electronically Signed   By: Van Clines M.D.   On: 10/08/2018 18:34    Procedures Procedures (including critical care time)  Medications Ordered in ED Medications  dexamethasone (DECADRON) tablet 8 mg (8 mg Oral Given 10/08/18 1710)  predniSONE (DELTASONE) tablet 40 mg (40 mg Oral Given 10/08/18 1712)  ibuprofen (ADVIL) tablet 400 mg (400 mg Oral Given 10/08/18 1713)  HYDROmorphone (DILAUDID) injection 1 mg (1 mg Intramuscular Given 10/08/18 1714)     Initial Impression / Assessment and Plan / ED Course  I have reviewed the triage vital signs and the nursing notes.  Pertinent labs & imaging results that were available during my care of the patient were reviewed by me and considered in my medical decision making (see chart for details).  68yF with lower back pain. Radicular pain into lower extremities. No  motor findings.    Final Clinical Impressions(s) / ED Diagnoses   Final diagnoses:  Acute midline low back pain with right-sided sciatica    ED Discharge Orders         Ordered    HYDROcodone-acetaminophen (NORCO/VICODIN) 5-325 MG tablet  Every 6 hours PRN     10/08/18 1925    predniSONE (DELTASONE) 20 MG tablet  Daily     10/08/18 1925    gabapentin (NEURONTIN) 300 MG capsule  4 times daily     10/08/18 1925           Virgel Manifold, MD 10/09/18 1102

## 2019-01-16 DIAGNOSIS — H524 Presbyopia: Secondary | ICD-10-CM | POA: Diagnosis not present

## 2019-01-16 DIAGNOSIS — H5203 Hypermetropia, bilateral: Secondary | ICD-10-CM | POA: Diagnosis not present

## 2019-02-24 ENCOUNTER — Emergency Department (HOSPITAL_BASED_OUTPATIENT_CLINIC_OR_DEPARTMENT_OTHER): Payer: Medicare HMO

## 2019-02-24 ENCOUNTER — Emergency Department (HOSPITAL_BASED_OUTPATIENT_CLINIC_OR_DEPARTMENT_OTHER)
Admission: EM | Admit: 2019-02-24 | Discharge: 2019-02-25 | Disposition: A | Discharge status: Home / Self Care | Payer: Medicare HMO | Attending: Emergency Medicine | Admitting: Emergency Medicine

## 2019-02-24 ENCOUNTER — Encounter (HOSPITAL_BASED_OUTPATIENT_CLINIC_OR_DEPARTMENT_OTHER): Payer: Self-pay | Admitting: *Deleted

## 2019-02-24 ENCOUNTER — Other Ambulatory Visit: Payer: Self-pay

## 2019-02-24 DIAGNOSIS — K8020 Calculus of gallbladder without cholecystitis without obstruction: Secondary | ICD-10-CM | POA: Diagnosis not present

## 2019-02-24 DIAGNOSIS — R1031 Right lower quadrant pain: Secondary | ICD-10-CM

## 2019-02-24 DIAGNOSIS — K5732 Diverticulitis of large intestine without perforation or abscess without bleeding: Secondary | ICD-10-CM

## 2019-02-24 DIAGNOSIS — N184 Chronic kidney disease, stage 4 (severe): Secondary | ICD-10-CM | POA: Insufficient documentation

## 2019-02-24 DIAGNOSIS — J45909 Unspecified asthma, uncomplicated: Secondary | ICD-10-CM | POA: Diagnosis not present

## 2019-02-24 DIAGNOSIS — N189 Chronic kidney disease, unspecified: Secondary | ICD-10-CM | POA: Insufficient documentation

## 2019-02-24 DIAGNOSIS — Z7982 Long term (current) use of aspirin: Secondary | ICD-10-CM | POA: Diagnosis not present

## 2019-02-24 DIAGNOSIS — R509 Fever, unspecified: Secondary | ICD-10-CM | POA: Diagnosis not present

## 2019-02-24 DIAGNOSIS — K529 Noninfective gastroenteritis and colitis, unspecified: Secondary | ICD-10-CM | POA: Diagnosis not present

## 2019-02-24 DIAGNOSIS — I129 Hypertensive chronic kidney disease with stage 1 through stage 4 chronic kidney disease, or unspecified chronic kidney disease: Secondary | ICD-10-CM | POA: Diagnosis not present

## 2019-02-24 DIAGNOSIS — K5730 Diverticulosis of large intestine without perforation or abscess without bleeding: Secondary | ICD-10-CM | POA: Diagnosis not present

## 2019-02-24 DIAGNOSIS — K5792 Diverticulitis of intestine, part unspecified, without perforation or abscess without bleeding: Secondary | ICD-10-CM | POA: Insufficient documentation

## 2019-02-24 DIAGNOSIS — N289 Disorder of kidney and ureter, unspecified: Secondary | ICD-10-CM

## 2019-02-24 DIAGNOSIS — Z79899 Other long term (current) drug therapy: Secondary | ICD-10-CM | POA: Diagnosis not present

## 2019-02-24 DIAGNOSIS — E039 Hypothyroidism, unspecified: Secondary | ICD-10-CM | POA: Insufficient documentation

## 2019-02-24 LAB — CBC WITH DIFFERENTIAL/PLATELET
Abs Immature Granulocytes: 0.03 10*3/uL (ref 0.00–0.07)
Basophils Absolute: 0.1 10*3/uL (ref 0.0–0.1)
Basophils Relative: 1 %
Eosinophils Absolute: 0.2 10*3/uL (ref 0.0–0.5)
Eosinophils Relative: 4 %
HCT: 41.4 % (ref 36.0–46.0)
Hemoglobin: 12.7 g/dL (ref 12.0–15.0)
Immature Granulocytes: 1 %
Lymphocytes Relative: 26 %
Lymphs Abs: 1.6 10*3/uL (ref 0.7–4.0)
MCH: 28.1 pg (ref 26.0–34.0)
MCHC: 30.7 g/dL (ref 30.0–36.0)
MCV: 91.6 fL (ref 80.0–100.0)
Monocytes Absolute: 0.6 10*3/uL (ref 0.1–1.0)
Monocytes Relative: 10 %
Neutro Abs: 3.6 10*3/uL (ref 1.7–7.7)
Neutrophils Relative %: 58 %
Platelets: 316 10*3/uL (ref 150–400)
RBC: 4.52 MIL/uL (ref 3.87–5.11)
RDW: 15.4 % (ref 11.5–15.5)
WBC: 6.1 10*3/uL (ref 4.0–10.5)
nRBC: 0 % (ref 0.0–0.2)

## 2019-02-24 MED ORDER — SODIUM CHLORIDE 0.9 % IV SOLN
INTRAVENOUS | Status: DC
  Administered 2019-02-24: via INTRAVENOUS

## 2019-02-24 MED ORDER — ONDANSETRON HCL 4 MG/2ML IJ SOLN
4.0000 mg | Freq: Once | INTRAMUSCULAR | Status: AC
  Administered 2019-02-24: 4 mg via INTRAVENOUS
  Filled 2019-02-24: qty 2

## 2019-02-24 MED ORDER — SODIUM CHLORIDE 0.9 % IV BOLUS
500.0000 mL | Freq: Once | INTRAVENOUS | Status: AC
  Administered 2019-02-24: 23:00:00 500 mL via INTRAVENOUS

## 2019-02-24 NOTE — ED Triage Notes (Signed)
Fatigue x 3 days. Fever, vomiting, and diarrhea today. Her grand daughter had the same symptoms last week.

## 2019-02-24 NOTE — ED Provider Notes (Signed)
[facility] Provider Note   CSN: [id number] Arrival date & time: 02/24/19  2157     History   Chief Complaint Chief Complaint  Patient presents with   Fever   Emesis    HPI [place] is a 68 y.o. female.     Patient with a complaint of fatigue fever vomiting and diarrhea and some abdominal pain mostly right lower quadrant.  Symptoms started on Friday.  No blood in the vomit no blood in the diarrhea.  Vomited twice today.  3 episodes of diarrhea yesterday.  Denies any cough or congestion.  Patient's granddaughter had similar symptoms and was evaluated on Friday had Covid testing done but does not know the results yet.  Patient not on any blood thinners.  Past medical history significant for hypertension chronic kidney disease acute respiratory failure with hypoxia in 2018.  Patient's had an appendectomy in the past.     Past Medical History:  Diagnosis Date   Acute respiratory failure with hypoxia ([place]) 06/07/2016   RESOLVED    Anemia    as a child   Anxiety 06/07/2016   Aortic atherosclerosis ([place])    Arthritis    Asthma    bronchitis   CAP (community acquired pneumonia) 12/21/2015   Cervical radiculopathy    Chronic kidney disease (CKD), stage IV (severe) ([place]) 05/30/2016   Chronic pain in left shoulder    CKD (chronic kidney disease)    pt not aware of this   CKD (chronic kidney disease) stage 3, GFR 30-59 ml/min    Depression    Grade I diastolic dysfunction 123456   Noted on ECHO   History of hiatal hernia 05/29/2016   Moderate, noted on CXR   Humerus fracture 02/24/2017   Left   Hypertension    Hypothyroidism    LVH (left ventricular hypertrophy) 12/23/2015   Mild, noted on ECHO   Pneumonia    Pre-diabetes    PVC's (premature ventricular contractions) 12/22/2015   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    Patient Active Problem List   Diagnosis Date Noted    Osteoarthritis of left knee 02/14/2018   Osteoarthritis of right knee 11/15/2017   Humerus fracture 03/09/2017   Asthma 03/09/2017   Hypothyroidism 03/09/2017   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   Genetic testing 02/06/2017   Acute respiratory failure with hypoxia ([place]) 06/07/2016   RSV (acute bronchiolitis due to respiratory syncytial virus) 06/07/2016   Anxiety 06/07/2016   Arthritis    Depression 05/30/2016   Chronic kidney disease (CKD), stage IV (severe) ([place]) 05/30/2016   Mild intermittent asthma with acute exacerbation    Respiratory distress    CKD (chronic kidney disease) stage 3, GFR 30-59 ml/min    Chronic pain syndrome    Bronchitis 05/29/2016   Left shoulder pain    Opacity of lung on imaging study    Shoulder pain, acute    Weakness generalized 12/22/2015   Essential hypertension 12/22/2015   Pneumonia 12/22/2015   PVC's (premature ventricular contractions) 12/22/2015   CAP (community acquired pneumonia) 12/21/2015    Past Surgical History:  Procedure Laterality Date   APPENDECTOMY     KNEE ARTHROPLASTY Right 11/15/2017   Procedure: RIGHT TOTAL KNEE ARTHROPLASTY WITH COMPUTER NAVIGATION;  Surgeon: Rod Can, MD;  [facility];  Service: Orthopedics;  Laterality: Right;  Needs RNFA   KNEE ARTHROPLASTY Left 02/14/2018   Procedure: LEFT TOTAL KNEE ARTHROPLASTY WITH COMPUTER NAVIGATION;  Surgeon: Rod Can, MD;  [facility];  Service: Orthopedics;  Laterality: Left;  Adductor Block   REVERSE SHOULDER ARTHROPLASTY Left 03/01/2017   Procedure: REVERSE SHOULDER ARTHROPLASTY;  Surgeon: Justice Britain, MD;  Location: [facility];  Service: Orthopedics;  Laterality: Left;   TUBAL LIGATION       OB History   No obstetric history on file.      Home Medications    Prior to Admission medications   Medication Sig Start Date End Date Taking? Authorizing Provider  aspirin 81 MG chewable tablet Chew 1  tablet (81 mg total) by mouth 2 (two) times daily. Patient not taking: Reported on 02/04/2018 11/16/17   Rod Can, MD  aspirin EC 81 MG tablet Take 81 mg by mouth daily.    [provider]  docusate sodium (COLACE) 100 MG capsule Take 1 capsule (100 mg total) by mouth 2 (two) times daily. Patient taking differently: Take 100 mg by mouth daily.  11/16/17   Swinteck, Aaron Edelman, MD  escitalopram (LEXAPRO) 10 MG tablet Take 10 mg daily by mouth.    [provider]  gabapentin (NEURONTIN) 300 MG capsule Take 1 capsule (300 mg total) by mouth 4 (four) times daily. 10/08/18   Virgel Manifold, MD  HYDROcodone-acetaminophen (NORCO/VICODIN) 5-325 MG tablet Take 2 tablets by mouth every 6 (six) hours as needed. 10/08/18   Virgel Manifold, MD  hydrocortisone (ANUSOL-HC) 2.5 % rectal cream Apply rectally 2 times daily 02/21/18   Couture, Cortni S, PA-C  levothyroxine (SYNTHROID, LEVOTHROID) 100 MCG tablet Take 100 mcg by mouth daily before breakfast.  01/02/18   [provider]  lisinopril (PRINIVIL,ZESTRIL) 10 MG tablet Take 2 tablets (20 mg total) by mouth daily. 06/03/16   Ghimire, Henreitta Leber, MD  lisinopril (PRINIVIL,ZESTRIL) 20 MG tablet  01/02/18   [provider]  methocarbamol (ROBAXIN) 500 MG tablet Take 1 tablet (500 mg total) by mouth every 6 (six) hours as needed for muscle spasms. 02/15/18   Swinteck, Aaron Edelman, MD  ondansetron (ZOFRAN) 4 MG tablet Take 1 tablet (4 mg total) by mouth every 6 (six) hours as needed for nausea. 02/15/18   Swinteck, Aaron Edelman, MD  polyethylene glycol [facility]) packet Take 17 g by mouth daily. Dissolve one cap full in solution (water, gatorade, etc.) and administer once cap-full daily. You may titrate up daily by 1 cap-full until the patient is having pudding consistency of stools. After the patient is able to start passing softer stools they will need to be on 1/2 cap-full daily for 2 weeks. 02/21/18   Couture, Cortni S, PA-C  pravastatin (PRAVACHOL)  20 MG tablet Take 20 mg by mouth 2 (two) times a week. Twice a week    [provider]  predniSONE (DELTASONE) 20 MG tablet Take 1 tablet (20 mg total) by mouth daily. 10/08/18   Virgel Manifold, MD  senna (SENOKOT) 8.6 MG TABS tablet Take 1 tablet (8.6 mg total) by mouth 2 (two) times daily. Patient taking differently: Take 1 tablet by mouth daily as needed for moderate constipation.  11/16/17   Swinteck, Aaron Edelman, MD    Family History Family History  Problem Relation Age of Onset   Hypertension Other    Breast cancer Mother 61   Breast cancer Sister 90   Lung cancer Brother    Cancer Sister 57       "female" cancer   Cancer Sister        unknown cancer   Breast cancer Sister 1  Social History Social History   Tobacco Use   Smoking status: Never Smoker   Smokeless tobacco: Never Used  Substance Use Topics   Alcohol use: No   Drug use: No     Allergies   Patient has no known allergies.   Review of Systems Review of Systems  Constitutional: Positive for fatigue and fever. Negative for chills.  HENT: Negative for congestion, rhinorrhea and sore throat.   Eyes: Negative for visual disturbance.  Respiratory: Negative for cough and shortness of breath.   Cardiovascular: Negative for chest pain and leg swelling.  Gastrointestinal: Positive for abdominal pain, diarrhea, nausea and vomiting.  Genitourinary: Negative for dysuria.  Musculoskeletal: Negative for back pain and neck pain.  Skin: Negative for rash.  Neurological: Negative for dizziness, light-headedness and headaches.  Hematological: Does not bruise/bleed easily.  Psychiatric/Behavioral: Negative for confusion.     Physical Exam Updated Vital Signs BP 111/74    Pulse 83    Temp 98.4 F (36.9 C) (Oral)    Resp 20    Ht 1.702 m (5\' 7" )    Wt 122.9 kg    SpO2 99%    BMI 42.44 kg/m   Physical Exam Vitals signs and nursing note reviewed.  Constitutional:      General: She is not in acute  distress.    Appearance: Normal appearance. She is well-developed. She is obese.  HENT:     Head: Normocephalic and atraumatic.     Mouth/Throat:     Mouth: Mucous membranes are dry.     Comments: Mucous membranes slightly dry. Eyes:     Extraocular Movements: Extraocular movements intact.     Conjunctiva/sclera: Conjunctivae normal.     Pupils: Pupils are equal, round, and reactive to light.  Neck:     Musculoskeletal: Normal range of motion and neck supple.  Cardiovascular:     Rate and Rhythm: Normal rate and regular rhythm.     Heart sounds: No murmur.  Pulmonary:     Effort: Pulmonary effort is normal. No respiratory distress.     Breath sounds: Normal breath sounds.  Abdominal:     Palpations: Abdomen is soft. There is no mass.     Tenderness: There is no abdominal tenderness. There is no guarding.  Musculoskeletal: Normal range of motion.  Skin:    General: Skin is warm and dry.  Neurological:     General: No focal deficit present.     Mental Status: She is alert and oriented to person, place, and time.      ED Treatments / Results  Labs (all labs ordered are listed, but only abnormal results are displayed) Labs Reviewed  COMPREHENSIVE METABOLIC PANEL  CBC WITH DIFFERENTIAL/PLATELET  LIPASE, BLOOD  URINALYSIS, ROUTINE W REFLEX MICROSCOPIC    EKG None  Radiology Dg Chest Port 1 View  Result Date: 02/24/2019 CLINICAL DATA:  Fatigue, fever, vomiting EXAM: PORTABLE CHEST 1 VIEW COMPARISON:  05/31/2016 FINDINGS: Heart and mediastinal contours are within normal limits. No focal opacities or effusions. No acute bony abnormality. Prior left shoulder replacement. IMPRESSION: No active cardiopulmonary disease. Electronically Signed   By: Rolm Baptise M.D.   On: 02/24/2019 22:33    Procedures Procedures (including critical care time)  Medications Ordered in ED Medications  0.9 %  sodium chloride infusion (has no administration in time range)  sodium chloride 0.9 %  bolus 500 mL (has no administration in time range)  ondansetron (ZOFRAN) injection 4 mg (has no administration in time range)  Initial Impression / Assessment and Plan / ED Course  I have reviewed the triage vital signs and the nursing notes.  Pertinent labs & imaging results that were available during my care of the patient were reviewed by me and considered in my medical decision making (see chart for details).        Patient symptoms could be consistent with a gastroenteritis.  Has had her appendix removed.  With diverticulitis possibility as well.  Do the abdominal pain and her age will get CT scan abdomen pelvis.  Patient had history of renal insufficiency.  Also do not know whether she will be able to have it with IV contrast or not.  Chest x-ray negative for any signs of pneumonia.  Patient's granddaughter with similar symptoms.  Was tested for Covid results not known.  Covid is a possible etiology for patient's symptoms since she has had fever and fatigue.  Although no upper respiratory symptoms.  Disposition will be based on CT scan and labs.  Would do outpatient Covid testing if patient is eligible to go home.  Final Clinical Impressions(s) / ED Diagnoses   Final diagnoses:  Right lower quadrant abdominal pain  Gastroenteritis  Fever, unspecified fever cause    ED Discharge Orders    None       Fredia Sorrow, MD 02/24/19 2310

## 2019-02-25 DIAGNOSIS — K8020 Calculus of gallbladder without cholecystitis without obstruction: Secondary | ICD-10-CM | POA: Diagnosis not present

## 2019-02-25 DIAGNOSIS — K5730 Diverticulosis of large intestine without perforation or abscess without bleeding: Secondary | ICD-10-CM | POA: Diagnosis not present

## 2019-02-25 LAB — COMPREHENSIVE METABOLIC PANEL
ALT: 10 U/L (ref 0–44)
AST: 16 U/L (ref 15–41)
Albumin: 3.7 g/dL (ref 3.5–5.0)
Alkaline Phosphatase: 78 U/L (ref 38–126)
Anion gap: 10 (ref 5–15)
BUN: 24 mg/dL — ABNORMAL HIGH (ref 8–23)
CO2: 28 mmol/L (ref 22–32)
Calcium: 8.9 mg/dL (ref 8.9–10.3)
Chloride: 100 mmol/L (ref 98–111)
Creatinine, Ser: 1.29 mg/dL — ABNORMAL HIGH (ref 0.44–1.00)
GFR calc Af Amer: 49 mL/min — ABNORMAL LOW (ref >60)
GFR calc non Af Amer: 42 mL/min — ABNORMAL LOW (ref >60)
Glucose, Bld: 160 mg/dL — ABNORMAL HIGH (ref 70–99)
Potassium: 3.6 mmol/L (ref 3.5–5.1)
Sodium: 138 mmol/L (ref 135–145)
Total Bilirubin: 0.2 mg/dL — ABNORMAL LOW (ref 0.3–1.2)
Total Protein: 7.3 g/dL (ref 6.5–8.1)

## 2019-02-25 LAB — URINALYSIS, ROUTINE W REFLEX MICROSCOPIC
Bilirubin Urine: NEGATIVE
Glucose, UA: NEGATIVE mg/dL
Hgb urine dipstick: NEGATIVE
Ketones, ur: NEGATIVE mg/dL
Leukocytes,Ua: NEGATIVE
Nitrite: NEGATIVE
Protein, ur: NEGATIVE mg/dL
Specific Gravity, Urine: 1.02 (ref 1.005–1.030)
pH: 5.5 (ref 5.0–8.0)

## 2019-02-25 LAB — LIPASE, BLOOD: Lipase: 41 U/L (ref 11–51)

## 2019-02-25 MED ORDER — METRONIDAZOLE IN NACL 5-0.79 MG/ML-% IV SOLN
500.0000 mg | Freq: Once | INTRAVENOUS | Status: AC
  Administered 2019-02-25: 02:00:00 500 mg via INTRAVENOUS
  Filled 2019-02-25: qty 100

## 2019-02-25 MED ORDER — CIPROFLOXACIN IN D5W 400 MG/200ML IV SOLN
400.0000 mg | Freq: Once | INTRAVENOUS | Status: AC
  Administered 2019-02-25: 01:00:00 400 mg via INTRAVENOUS
  Filled 2019-02-25: qty 200

## 2019-02-25 MED ORDER — METRONIDAZOLE 500 MG PO TABS: 500.0000 mg | ORAL_TABLET | Freq: Three times a day (TID) | ORAL | 0 refills | Status: AC

## 2019-02-25 MED ORDER — ONDANSETRON HCL 4 MG PO TABS: 4.0000 mg | ORAL_TABLET | Freq: Four times a day (QID) | ORAL | 0 refills | Status: AC | PRN

## 2019-02-25 MED ORDER — CIPROFLOXACIN HCL 500 MG PO TABS: 500.0000 mg | ORAL_TABLET | Freq: Two times a day (BID) | ORAL | 0 refills | Status: AC

## 2019-02-25 MED ORDER — IOHEXOL 300 MG/ML  SOLN
100.0000 mL | Freq: Once | INTRAMUSCULAR | Status: AC | PRN
  Administered 2019-02-25: 100 mL via INTRAVENOUS

## 2019-02-25 MED FILL — METRONIDAZOLE 500 MG TABS: 500 | 10 days supply | Qty: 30 | Fill #0

## 2019-02-25 MED FILL — ONDANSETRON HCL 4 MG TABLET: 4 | 6 days supply | Qty: 20 | Fill #0

## 2019-02-25 MED FILL — CIPROFLOXACIN HCL 500 MG TA: 500 | 10 days supply | Qty: 20 | Fill #0

## 2019-02-25 NOTE — Discharge Instructions (Addendum)
Take loperamide (Imodium AD) as needed for diarrhea.  Your CT scan showed a nodule in your right breast or armpit. Please schedule a mammogram to look at that in more detail.

## 2019-02-25 NOTE — ED Provider Notes (Signed)
Care assumed from Dr. Deatra Ina, patient presenting with fever, abdominal pain, nausea and vomiting and diarrhea.  Labs showed renal insufficiency not significantly changed from baseline.  CT scan showed focal area of sigmoid diverticulitis, gallstones which were apparently asymptomatic.  Also, identified nodule either in the right breast or right axilla with recommendation to obtain follow-up mammogram.  This is discussed with patient and with her daughter.  Urinalysis shows no evidence of UTI.  She is started on antibiotics of ciprofloxacin and metronidazole and sent home with prescriptions for both as well as prescription for ondansetron.  Results for orders placed or performed during the hospital encounter of 02/24/19  Comprehensive metabolic panel  Result Value Ref Range   Sodium 138 135 - 145 mmol/L   Potassium 3.6 3.5 - 5.1 mmol/L   Chloride 100 98 - 111 mmol/L   CO2 28 22 - 32 mmol/L   Glucose, Bld 160 (H) 70 - 99 mg/dL   BUN 24 (H) 8 - 23 mg/dL   Creatinine, Ser 1.29 (H) 0.44 - 1.00 mg/dL   Calcium 8.9 8.9 - 10.3 mg/dL   Total Protein 7.3 6.5 - 8.1 g/dL   Albumin 3.7 3.5 - 5.0 g/dL   AST 16 15 - 41 U/L   ALT 10 0 - 44 U/L   Alkaline Phosphatase 78 38 - 126 U/L   Total Bilirubin 0.2 (L) 0.3 - 1.2 mg/dL   GFR calc non Af Amer 42 (L) >60 mL/min   GFR calc Af Amer 49 (L) >60 mL/min   Anion gap 10 5 - 15  CBC with Differential/Platelet  Result Value Ref Range   WBC 6.1 4.0 - 10.5 K/uL   RBC 4.52 3.87 - 5.11 MIL/uL   Hemoglobin 12.7 12.0 - 15.0 g/dL   HCT 41.4 36.0 - 46.0 %   MCV 91.6 80.0 - 100.0 fL   MCH 28.1 26.0 - 34.0 pg   MCHC 30.7 30.0 - 36.0 g/dL   RDW 15.4 11.5 - 15.5 %   Platelets 316 150 - 400 K/uL   nRBC 0.0 0.0 - 0.2 %   Neutrophils Relative % 58 %   Neutro Abs 3.6 1.7 - 7.7 K/uL   Lymphocytes Relative 26 %   Lymphs Abs 1.6 0.7 - 4.0 K/uL   Monocytes Relative 10 %   Monocytes Absolute 0.6 0.1 - 1.0 K/uL   Eosinophils Relative 4 %   Eosinophils Absolute 0.2  0.0 - 0.5 K/uL   Basophils Relative 1 %   Basophils Absolute 0.1 0.0 - 0.1 K/uL   Immature Granulocytes 1 %   Abs Immature Granulocytes 0.03 0.00 - 0.07 K/uL  Lipase, blood  Result Value Ref Range   Lipase 41 11 - 51 U/L  Urinalysis, Routine w reflex microscopic  Result Value Ref Range   Color, Urine YELLOW YELLOW   APPearance CLEAR CLEAR   Specific Gravity, Urine 1.020 1.005 - 1.030   pH 5.5 5.0 - 8.0   Glucose, UA NEGATIVE NEGATIVE mg/dL   Hgb urine dipstick NEGATIVE NEGATIVE   Bilirubin Urine NEGATIVE NEGATIVE   Ketones, ur NEGATIVE NEGATIVE mg/dL   Protein, ur NEGATIVE NEGATIVE mg/dL   Nitrite NEGATIVE NEGATIVE   Leukocytes,Ua NEGATIVE NEGATIVE   Ct Abdomen Pelvis W Contrast  Result Date: 02/25/2019 CLINICAL DATA:  Abdominal pain with diverticulitis suspected. EXAM: CT ABDOMEN AND PELVIS WITH CONTRAST TECHNIQUE: Multidetector CT imaging of the abdomen and pelvis was performed using the standard protocol following bolus administration of intravenous contrast. CONTRAST:  117mL OMNIPAQUE IOHEXOL 300 MG/ML  SOLN COMPARISON:  02/21/2018 FINDINGS: Lower chest: The lung bases are clear. The heart size is normal. There is a possible mildly enlarged 1 cm right axillary lymph node versus a right-sided breast nodule. This is only partially visualized and not well characterized on this exam. Hepatobiliary: There is a nodular appearance of the liver. Cholelithiasis without acute inflammation.There is no biliary ductal dilation. Pancreas: Normal contours without ductal dilatation. No peripancreatic fluid collection. Spleen: No splenic laceration or hematoma. Adrenals/Urinary Tract: --Adrenal glands: No adrenal hemorrhage. --Right kidney/ureter: No hydronephrosis or perinephric hematoma. --Left kidney/ureter: No hydronephrosis or perinephric hematoma. --Urinary bladder: Unremarkable. Stomach/Bowel: --Stomach/Duodenum: There is a large hiatal hernia. --Small bowel: There are few loops of small bowel in  the left mid abdomen the demonstrate wall thickening and adjacent mesenteric edema. --Colon: There is sigmoid diverticulosis with trace fat stranding in the left lower quadrant (axial series 2, image 77 and coronal series 5, image 40). --Appendix: Not visualized. No right lower quadrant inflammation or free fluid. Vascular/Lymphatic: Atherosclerotic calcification is present within the non-aneurysmal abdominal aorta, without hemodynamically significant stenosis. --No retroperitoneal lymphadenopathy. --No mesenteric lymphadenopathy. --No pelvic or inguinal lymphadenopathy. Reproductive: There is a fibroid uterus. Other: No ascites or free air. There is a fat containing periumbilical hernia. Musculoskeletal. No acute displaced fractures. IMPRESSION: 1. Sigmoid diverticulosis with trace adjacent fat stranding concerning for early uncomplicated diverticulitis. 2. Wall thickening of several loops of small bowel in the left midline abdomen may represent enteritis. 3. There is cholelithiasis without secondary signs of acute cholecystitis. 4. Mildly nodular liver surface contour concerning for underlying cirrhosis. 5. Fibroid uterus. 6. Mildly enlarged right axillary lymph node versus right-sided breast nodule. Follow-up with outpatient mammography is recommended. 7. Large hiatal hernia. 8.  Aortic Atherosclerosis (ICD10-I70.0). Electronically Signed   By: Constance Holster M.D.   On: 02/25/2019 00:36   Dg Chest Port 1 View  Result Date: 02/24/2019 CLINICAL DATA:  Fatigue, fever, vomiting EXAM: PORTABLE CHEST 1 VIEW COMPARISON:  05/31/2016 FINDINGS: Heart and mediastinal contours are within normal limits. No focal opacities or effusions. No acute bony abnormality. Prior left shoulder replacement. IMPRESSION: No active cardiopulmonary disease. Electronically Signed   By: Rolm Baptise M.D.   On: 02/24/2019 22:33   Images viewed by me.    Delora Fuel, MD [id number]

## 2019-02-25 NOTE — ED Notes (Signed)
Pt. Urinated no need for In and Out Cath

## 2019-02-28 ENCOUNTER — Other Ambulatory Visit: Payer: Self-pay | Admitting: Internal Medicine

## 2019-03-03 ENCOUNTER — Other Ambulatory Visit: Payer: Self-pay | Admitting: Internal Medicine

## 2019-03-03 DIAGNOSIS — N6310 Unspecified lump in the right breast, unspecified quadrant: Secondary | ICD-10-CM

## 2019-03-11 ENCOUNTER — Ambulatory Visit
Admission: RE | Admit: 2019-03-11 | Discharge: 2019-03-11 | Disposition: A | Discharge status: Home / Self Care | Payer: Medicare HMO | Source: Ambulatory Visit | Attending: Internal Medicine | Admitting: Internal Medicine

## 2019-03-11 ENCOUNTER — Ambulatory Visit: Payer: Medicare HMO

## 2019-03-11 ENCOUNTER — Other Ambulatory Visit: Payer: Self-pay

## 2019-03-11 DIAGNOSIS — N6310 Unspecified lump in the right breast, unspecified quadrant: Secondary | ICD-10-CM | POA: Diagnosis not present

## 2019-03-11 DIAGNOSIS — N6489 Other specified disorders of breast: Secondary | ICD-10-CM | POA: Diagnosis not present

## 2019-06-21 IMAGING — DX DG SHOULDER 2+V*L*
2 series · 2 of 2 positions shown · non-contrast
Comparison: None.

CLINICAL DATA: Pt fell outside while at the salon today. Pt states
she lost her balance. Left shoulder pain.

EXAM:
LEFT SHOULDER - 2+ VIEW

[shoulder grashey]
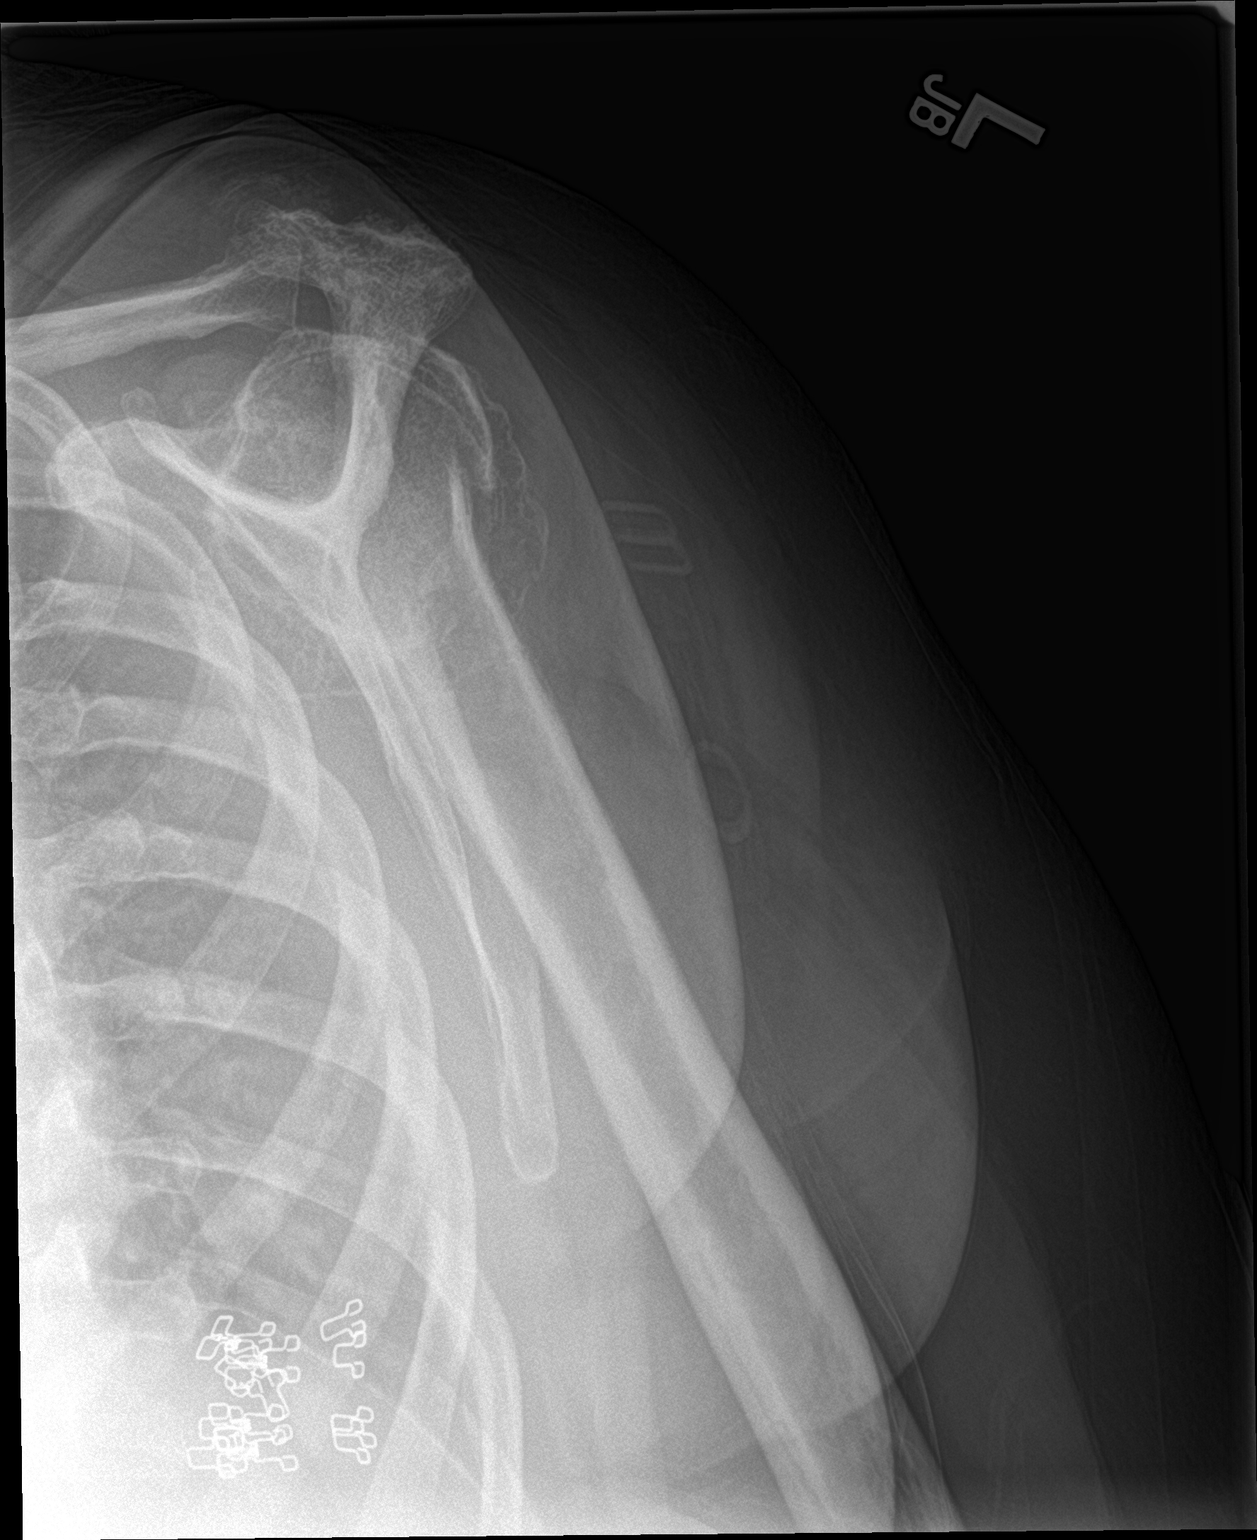

[shoulder y view]
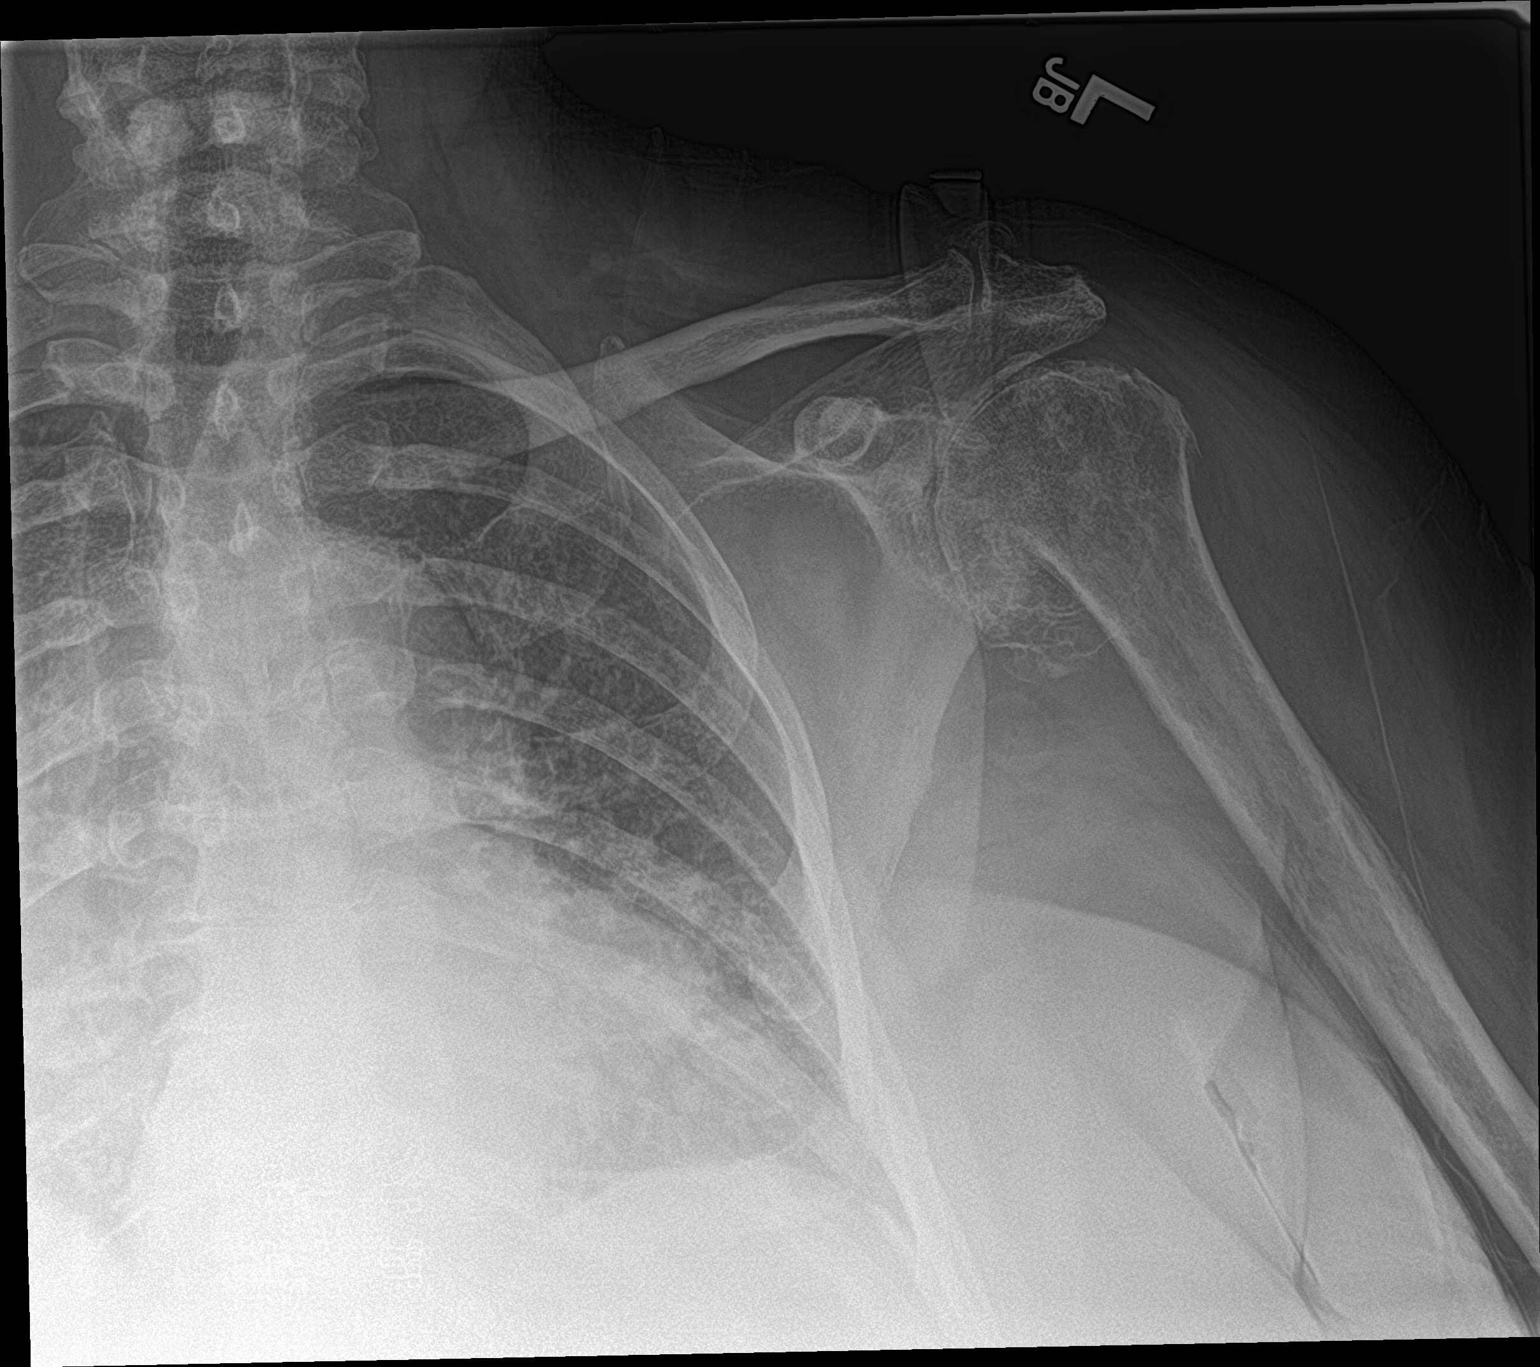

[2 of 2 positions shown; findings below may reference images not displayed]

FINDINGS: Fracture across the proximal humeral metaphysis which is not
well-defined. The shaft component has migrated superiorly in
relation to the humeral head component.

No other fractures.  No dislocation.

There is significant narrowing of the glenohumeral joint with
prominent marginal osteophytes from base of the humeral head. AC
joint is normally spaced and aligned.

Bones are diffusely demineralized.
IMPRESSION: 1. Although the fracture line is not defined, the configuration of
the proximal humerus is consistent with a fracture of the
metaphysis, with shaft impacting against the humeral head component.
2. No dislocation.
3. Advanced glenohumeral joint arthropathic changes.

## 2019-06-21 IMAGING — DX DG HUMERUS 2V *L*
2 series · 2 of 2 positions shown · non-contrast
Comparison: None.

CLINICAL DATA: Pt fell outside while at the salon today. Pt states
she lost her balance

EXAM:
LEFT HUMERUS - 2+ VIEW

[humerus ap]
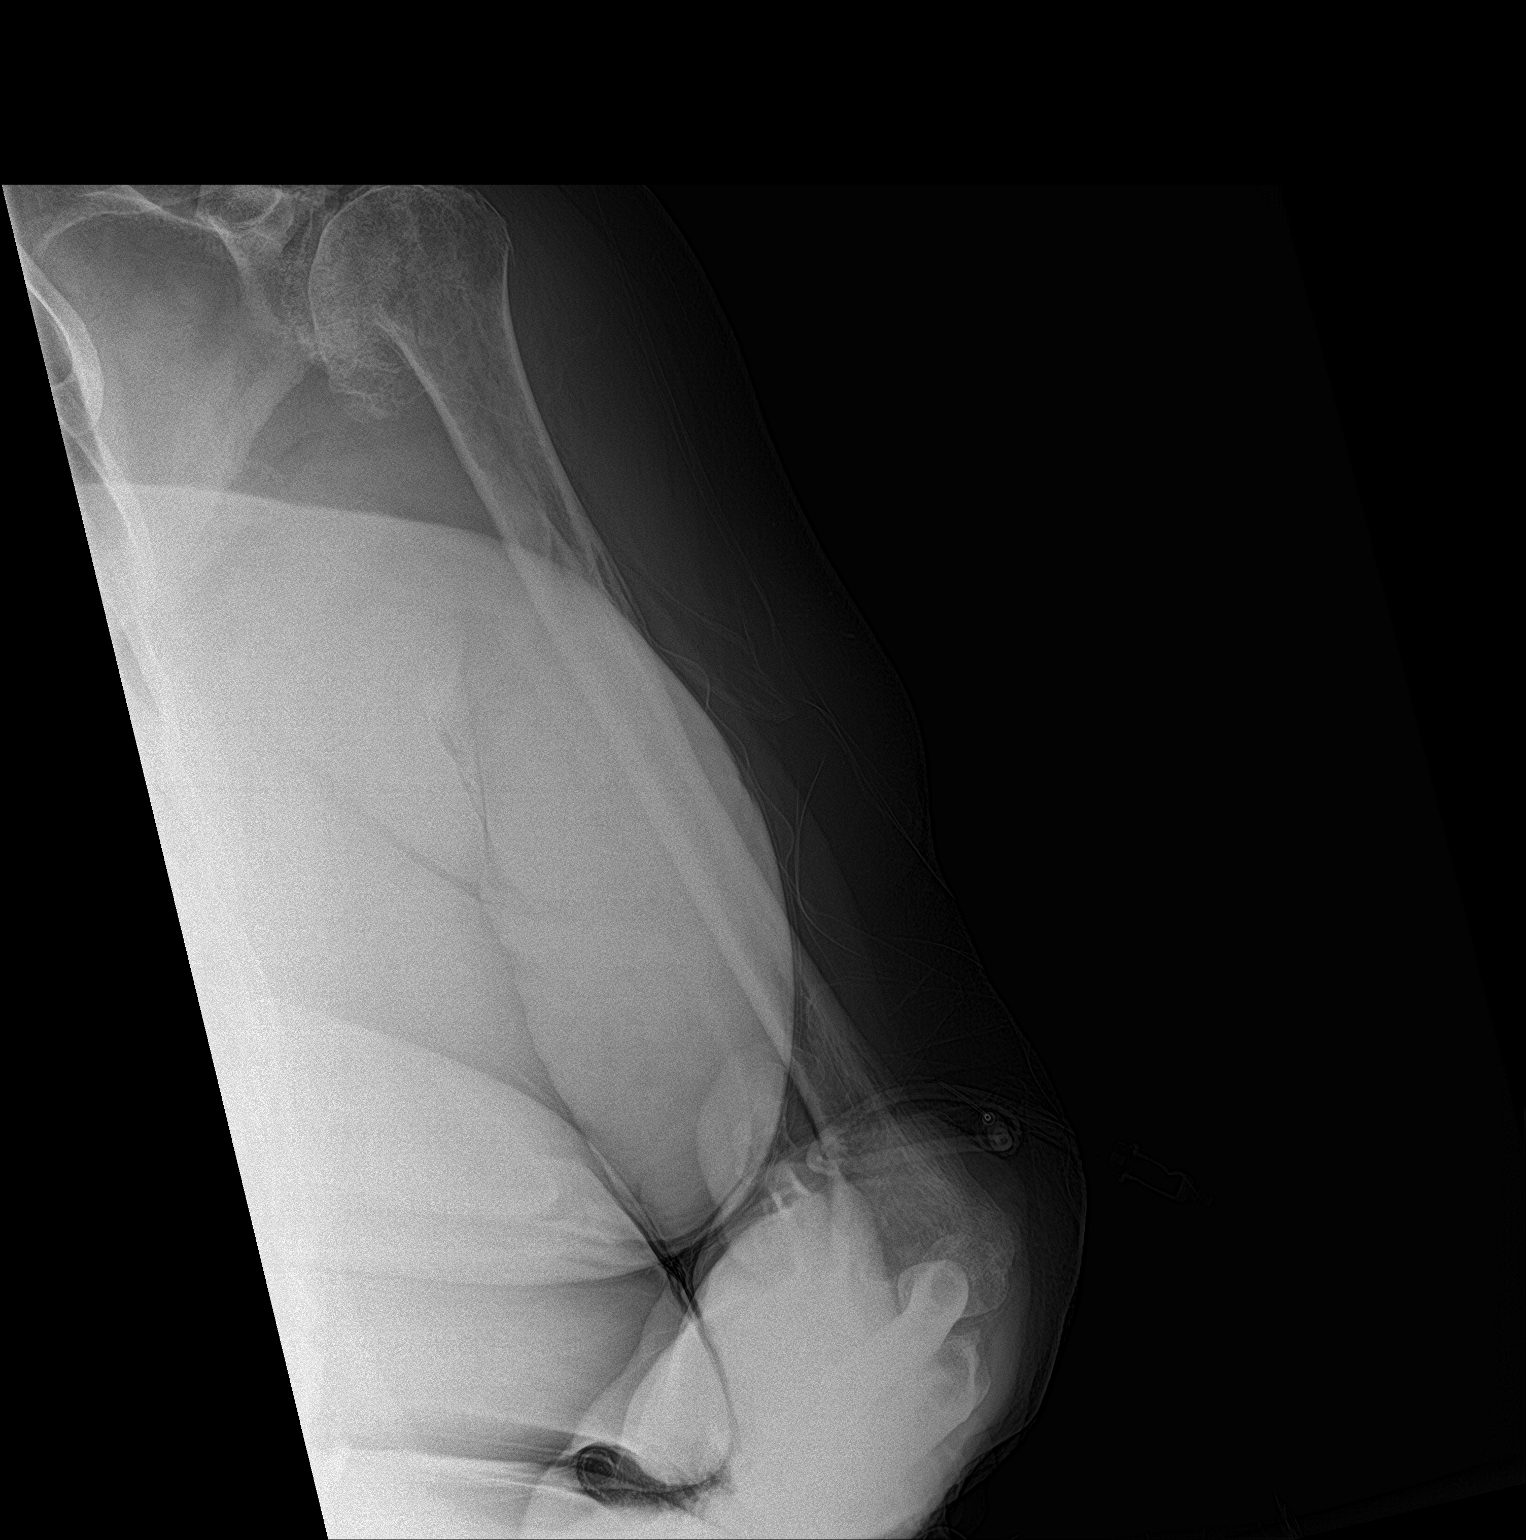

[humerus lat]
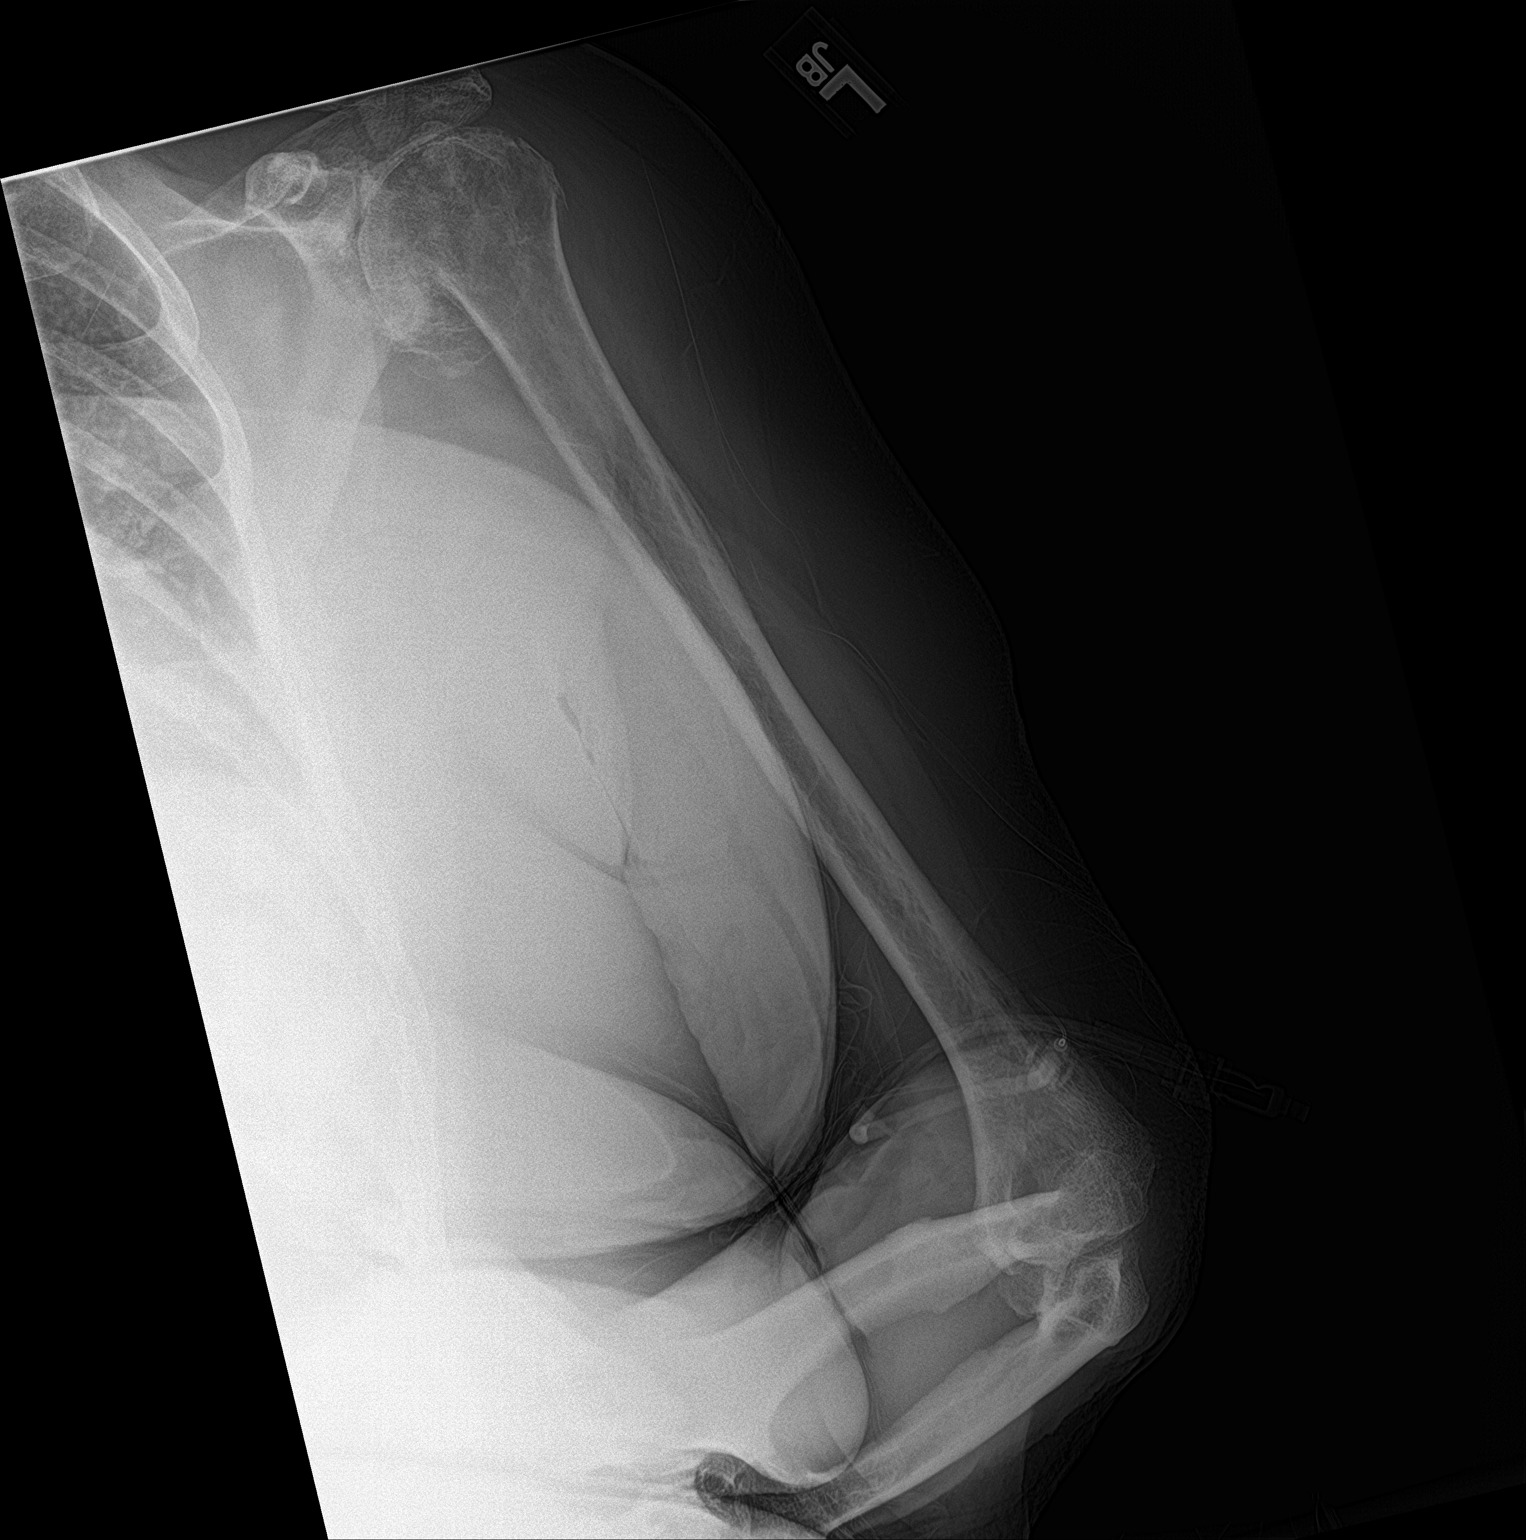

[2 of 2 positions shown; findings below may reference images not displayed]

FINDINGS: Evidence of a fracture of the proximal humeral metaphysis as defined
on the left shoulder radiographs. Fracture line is better seen on
the humeral radiographs than the shoulder radiographs.

No other fractures.  No dislocation.  The bones are demineralized.
IMPRESSION: Fracture across the proximal metaphysis of the left humerus with the
shaft impacting against the humeral head component. No other
fractures. No dislocation.

## 2019-08-26 DIAGNOSIS — F329 Major depressive disorder, single episode, unspecified: Secondary | ICD-10-CM | POA: Diagnosis not present

## 2019-08-26 DIAGNOSIS — E1169 Type 2 diabetes mellitus with other specified complication: Secondary | ICD-10-CM | POA: Diagnosis not present

## 2019-08-26 DIAGNOSIS — I129 Hypertensive chronic kidney disease with stage 1 through stage 4 chronic kidney disease, or unspecified chronic kidney disease: Secondary | ICD-10-CM | POA: Diagnosis not present

## 2019-08-26 DIAGNOSIS — Z9181 History of falling: Secondary | ICD-10-CM | POA: Diagnosis not present

## 2019-08-26 DIAGNOSIS — R600 Localized edema: Secondary | ICD-10-CM | POA: Diagnosis not present

## 2019-08-26 DIAGNOSIS — E039 Hypothyroidism, unspecified: Secondary | ICD-10-CM | POA: Diagnosis not present

## 2019-08-26 DIAGNOSIS — Z139 Encounter for screening, unspecified: Secondary | ICD-10-CM | POA: Diagnosis not present

## 2019-08-26 DIAGNOSIS — E785 Hyperlipidemia, unspecified: Secondary | ICD-10-CM | POA: Diagnosis not present

## 2019-08-26 DIAGNOSIS — N183 Chronic kidney disease, stage 3 unspecified: Secondary | ICD-10-CM | POA: Diagnosis not present

## 2019-09-18 ENCOUNTER — Encounter (HOSPITAL_BASED_OUTPATIENT_CLINIC_OR_DEPARTMENT_OTHER): Payer: Self-pay

## 2019-09-18 ENCOUNTER — Emergency Department (HOSPITAL_BASED_OUTPATIENT_CLINIC_OR_DEPARTMENT_OTHER)
Admission: EM | Admit: 2019-09-18 | Discharge: 2019-09-19 | Disposition: A | Discharge status: Home / Self Care | Payer: Medicare HMO | Attending: Emergency Medicine | Admitting: Emergency Medicine

## 2019-09-18 ENCOUNTER — Other Ambulatory Visit: Payer: Self-pay

## 2019-09-18 DIAGNOSIS — I10 Essential (primary) hypertension: Secondary | ICD-10-CM | POA: Diagnosis not present

## 2019-09-18 DIAGNOSIS — R7303 Prediabetes: Secondary | ICD-10-CM | POA: Diagnosis not present

## 2019-09-18 DIAGNOSIS — Z7984 Long term (current) use of oral hypoglycemic drugs: Secondary | ICD-10-CM | POA: Insufficient documentation

## 2019-09-18 DIAGNOSIS — I129 Hypertensive chronic kidney disease with stage 1 through stage 4 chronic kidney disease, or unspecified chronic kidney disease: Secondary | ICD-10-CM | POA: Diagnosis not present

## 2019-09-18 DIAGNOSIS — Z79899 Other long term (current) drug therapy: Secondary | ICD-10-CM | POA: Insufficient documentation

## 2019-09-18 DIAGNOSIS — N184 Chronic kidney disease, stage 4 (severe): Secondary | ICD-10-CM | POA: Insufficient documentation

## 2019-09-18 DIAGNOSIS — M436 Torticollis: Secondary | ICD-10-CM | POA: Diagnosis not present

## 2019-09-18 DIAGNOSIS — E039 Hypothyroidism, unspecified: Secondary | ICD-10-CM | POA: Diagnosis not present

## 2019-09-18 DIAGNOSIS — J45909 Unspecified asthma, uncomplicated: Secondary | ICD-10-CM | POA: Insufficient documentation

## 2019-09-18 DIAGNOSIS — M542 Cervicalgia: Secondary | ICD-10-CM | POA: Diagnosis present

## 2019-09-18 DIAGNOSIS — Z7982 Long term (current) use of aspirin: Secondary | ICD-10-CM | POA: Insufficient documentation

## 2019-09-18 NOTE — ED Triage Notes (Signed)
Pt c/o L sided neck pain x 3 days ago after waking up from sleep. Pt states it gave her a headache, and pain radiated down L arm.

## 2019-09-19 MED ORDER — CYCLOBENZAPRINE HCL 10 MG PO TABS: 10.0000 mg | ORAL_TABLET | Freq: Three times a day (TID) | ORAL | 0 refills | Status: AC | PRN

## 2019-09-19 MED ORDER — SODIUM CHLORIDE 0.9 % IV BOLUS
1000.0000 mL | Freq: Once | INTRAVENOUS | Status: AC
  Administered 2019-09-19: 1000 mL via INTRAVENOUS

## 2019-09-19 MED ORDER — KETOROLAC TROMETHAMINE 15 MG/ML IJ SOLN
15.0000 mg | Freq: Once | INTRAMUSCULAR | Status: AC
  Administered 2019-09-19: 15 mg via INTRAVENOUS
  Filled 2019-09-19: qty 1

## 2019-09-19 MED ORDER — PROCHLORPERAZINE EDISYLATE 10 MG/2ML IJ SOLN
10.0000 mg | Freq: Once | INTRAMUSCULAR | Status: AC
  Administered 2019-09-19: 10 mg via INTRAVENOUS
  Filled 2019-09-19: qty 2

## 2019-09-19 MED ORDER — DIPHENHYDRAMINE HCL 50 MG/ML IJ SOLN
25.0000 mg | Freq: Once | INTRAMUSCULAR | Status: AC
  Administered 2019-09-19: 25 mg via INTRAVENOUS
  Filled 2019-09-19: qty 1

## 2019-09-19 MED ORDER — CYCLOBENZAPRINE HCL 10 MG PO TABS
10.0000 mg | ORAL_TABLET | Freq: Once | ORAL | Status: AC
  Administered 2019-09-19: 10 mg via ORAL
  Filled 2019-09-19: qty 1

## 2019-09-19 MED FILL — CYCLOBENZAPRINE HCL 10 MG T: 10 | 10 days supply | Qty: 30 | Fill #0

## 2019-09-19 NOTE — ED Provider Notes (Signed)
[facility] EMERGENCY DEPARTMENT Provider Note   CSN: [id number] Arrival date & time: 09/18/19  2315   History Chief Complaint  Patient presents with  . Neck Pain    Cheryl Lane is a 69 y.o. female.  The history is provided by the patient.  Neck Pain She has history of hypertension, chronic kidney disease and comes in complaining of pain in the left side of her neck radiating to the head and left upper arm.  Pain started 2 days ago and she noticed it when she woke up.  She is unable to characterize the pain but it is constant.  Nothing makes it better, nothing makes it worse.  She did take 1 dose of acetaminophen without relief.  She denies weakness or numbness.  There is slight tingling in her fingers which is chronic and unchanged.  She denies any unusual activity or recent trauma.  Pain is rated at 10/10.  Past Medical History:  Diagnosis Date  . Acute respiratory failure with hypoxia ([place]) 06/07/2016   RESOLVED   . Anemia    as a child  . Anxiety 06/07/2016  . Aortic atherosclerosis ([place])   . Arthritis   . Asthma    bronchitis  . CAP (community acquired pneumonia) 12/21/2015  . Cervical radiculopathy   . Chronic kidney disease (CKD), stage IV (severe) ([place]) 05/30/2016  . Chronic pain in left shoulder   . CKD (chronic kidney disease)    pt not aware of this  . CKD (chronic kidney disease) stage 3, GFR 30-59 ml/min   . Depression   . Grade I diastolic dysfunction 64/40/3474   Noted on ECHO  . History of hiatal hernia 05/29/2016   Moderate, noted on CXR  . Humerus fracture 02/24/2017   Left  . Hypertension   . Hypothyroidism   . LVH (left ventricular hypertrophy) 12/23/2015   Mild, noted on ECHO  . Pneumonia   . Pre-diabetes   . PVC's (premature ventricular contractions) 12/22/2015  . 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 Osteoarthritis of left knee 02/14/2018  .  Osteoarthritis of right knee 11/15/2017  . Humerus fracture 03/09/2017  . Asthma 03/09/2017  . Hypothyroidism 03/09/2017  . 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 Genetic testing 02/06/2017  . Acute respiratory failure with hypoxia ([place]) 06/07/2016  . RSV (acute bronchiolitis due to respiratory syncytial virus) 06/07/2016  . Anxiety 06/07/2016  . Arthritis   . Depression 05/30/2016  . Chronic kidney disease (CKD), stage IV (severe) ([place]) 05/30/2016  . Mild intermittent asthma with acute exacerbation   . Respiratory distress   . CKD (chronic kidney disease) stage 3, GFR 30-59 ml/min   . Chronic pain syndrome   . Bronchitis 05/29/2016  . Left shoulder pain   . Opacity of lung on imaging study   . Shoulder pain, acute   . Weakness generalized 12/22/2015  . Essential hypertension 12/22/2015  . Pneumonia 12/22/2015  . PVC's (premature ventricular contractions) 12/22/2015  . CAP (community acquired pneumonia) 12/21/2015    Past Surgical History:  Procedure Laterality Date  . APPENDECTOMY    . KNEE ARTHROPLASTY Right 11/15/2017   Procedure: RIGHT TOTAL KNEE ARTHROPLASTY WITH COMPUTER NAVIGATION;  Surgeon: Rod Can, MD;  Location: [facility];  Service: Orthopedics;  Laterality: Right;  Needs RNFA  . KNEE ARTHROPLASTY Left 02/14/2018   Procedure: LEFT TOTAL KNEE ARTHROPLASTY WITH COMPUTER NAVIGATION;  Surgeon: Rod Can, MD;  Location: [facility];  Service: Orthopedics;  Laterality: Left;  Adductor Block  . REVERSE SHOULDER ARTHROPLASTY Left 03/01/2017   Procedure: REVERSE SHOULDER ARTHROPLASTY;  Surgeon: Justice Britain, MD;  Location: [facility];  Service: Orthopedics;  Laterality: Left;  . TUBAL LIGATION       OB History   No obstetric history on file.     Family History  Problem Relation Age of Onset  . Hypertension Other   . Breast cancer Mother 48  . Breast cancer Sister 82  . Lung cancer Brother   . Cancer Sister 63       "female" cancer  .  Cancer Sister        unknown cancer  . Breast cancer Sister 1  . Breast cancer Daughter 75    Social History   Tobacco Use  . Smoking status: Never Smoker  . Smokeless tobacco: Never Used  Substance Use Topics  . Alcohol use: No  . Drug use: No    Home Medications Prior to Admission medications   Medication Sig Start Date End Date Taking? Authorizing Provider  aspirin 81 MG chewable tablet Chew 1 tablet (81 mg total) by mouth 2 (two) times daily. Patient not taking: Reported on 02/04/2018 11/16/17   Rod Can, MD  aspirin EC 81 MG tablet Take 81 mg by mouth daily.    [provider]  ciprofloxacin (CIPRO) 500 MG tablet Take 1 tablet (500 mg total) by mouth 2 (two) times daily. 94/4/96   Delora Fuel, MD  docusate sodium (COLACE) 100 MG capsule Take 1 capsule (100 mg total) by mouth 2 (two) times daily. Patient taking differently: Take 100 mg by mouth daily.  11/16/17   Swinteck, Aaron Edelman, MD  escitalopram (LEXAPRO) 10 MG tablet Take 10 mg daily by mouth.    [provider]  gabapentin (NEURONTIN) 300 MG capsule Take 1 capsule (300 mg total) by mouth 4 (four) times daily. 10/08/18   Virgel Manifold, MD  HYDROcodone-acetaminophen (NORCO/VICODIN) 5-325 MG tablet Take 2 tablets by mouth every 6 (six) hours as needed. 10/08/18   Virgel Manifold, MD  hydrocortisone (ANUSOL-HC) 2.5 % rectal cream Apply rectally 2 times daily 02/21/18   Couture, Cortni S, PA-C  levothyroxine (SYNTHROID, LEVOTHROID) 100 MCG tablet Take 100 mcg by mouth daily before breakfast.  01/02/18   [provider]  lisinopril (PRINIVIL,ZESTRIL) 10 MG tablet Take 2 tablets (20 mg total) by mouth daily. 06/03/16   Ghimire, Henreitta Leber, MD  lisinopril (PRINIVIL,ZESTRIL) 20 MG tablet  01/02/18   [provider]  methocarbamol (ROBAXIN) 500 MG tablet Take 1 tablet (500 mg total) by mouth every 6 (six) hours as needed for muscle spasms. 02/15/18   Swinteck, Aaron Edelman, MD  metroNIDAZOLE (FLAGYL) 500 MG  tablet Take 1 tablet (500 mg total) by mouth 3 (three) times daily. 75/9/16   Delora Fuel, MD  ondansetron (ZOFRAN) 4 MG tablet Take 1 tablet (4 mg total) by mouth every 6 (six) hours as needed for nausea. 38/4/66   Delora Fuel, MD  polyethylene glycol [facility]) packet Take 17 g by mouth daily. Dissolve one cap full in solution (water, gatorade, etc.) and administer once cap-full daily. You may titrate up daily by 1 cap-full until the patient is having pudding consistency of stools. After the patient is able to start passing softer stools they will need to be on 1/2 cap-full daily for 2 weeks. 02/21/18   Couture, Cortni S, PA-C  pravastatin (PRAVACHOL) 20 MG tablet  Take 20 mg by mouth 2 (two) times a week. Twice a week    [provider]  predniSONE (DELTASONE) 20 MG tablet Take 1 tablet (20 mg total) by mouth daily. 10/08/18   Virgel Manifold, MD  senna (SENOKOT) 8.6 MG TABS tablet Take 1 tablet (8.6 mg total) by mouth 2 (two) times daily. Patient taking differently: Take 1 tablet by mouth daily as needed for moderate constipation.  11/16/17   Rod Can, MD    Allergies    Patient has no known allergies.  Review of Systems   Review of Systems  Musculoskeletal: Positive for neck pain.  All other systems reviewed and are negative.   Physical Exam Updated Vital Signs BP (!) 144/88 (BP Location: Right Arm)   Pulse 95   Temp 99.1 F (37.3 C) (Oral)   Resp 20   Ht 5\' 5"  (1.651 m)   Wt 117.9 kg   SpO2 96%   BMI 43.27 kg/m   Physical Exam Vitals and nursing note reviewed.   69 year old female, resting comfortably and in no acute distress. Vital signs are significant for mildly elevated blood pressure. Oxygen saturation is 96%, which is normal. Head is normocephalic and atraumatic. PERRLA, EOMI. Oropharynx is clear. Neck: There is spasm of the paracervical muscles which is greater on the left.  There is tenderness to the paracervical muscles which is greater on the left.   Pain is elicited with passive range of motion, but full range of motion is present.  There is no meningismus.  There is no adenopathy or JVD. Back is nontender and there is no CVA tenderness. Lungs are clear without rales, wheezes, or rhonchi. Chest is nontender. Heart has regular rate and rhythm without murmur. Abdomen is soft, flat, nontender without masses or hepatosplenomegaly and peristalsis is normoactive. Extremities have no cyanosis or edema, full range of motion is present. Skin is warm and dry without rash. Neurologic: Mental status is normal, cranial nerves are intact, there are no motor or sensory deficits.  Careful sensory exam of the arms shows no deficits.  Strength is 5/5 for grasp, wrist extension, wrist flexion, elbow flexion, elbow extension, shoulder abduction.  ED Results / Procedures / Treatments    Procedures Procedures   Medications Ordered in ED Medications  sodium chloride 0.9 % bolus 1,000 mL (0 mLs Intravenous Stopped 09/19/19 0155)  prochlorperazine (COMPAZINE) injection 10 mg (10 mg Intravenous Given 09/19/19 0034)  diphenhydrAMINE (BENADRYL) injection 25 mg (25 mg Intravenous Given 09/19/19 0032)  ketorolac (TORADOL) 15 MG/ML injection 15 mg (15 mg Intravenous Given 09/19/19 0033)  cyclobenzaprine (FLEXERIL) tablet 10 mg (10 mg Oral Given 09/19/19 0031)    ED Course  I have reviewed the triage vital signs and the nursing notes.  Pertinent labs & imaging results that were available during my care of the patient were reviewed by me and considered in my medical decision making (see chart for details).  MDM Rules/Calculators/A&P Neck pain with radiation to head and upper arm consistent with torticollis, consistent with cervical radiculopathy.  No evidence of any neurologic deficit.  No red flags to suggest more serious pathology.  Old records are reviewed, and she has no relevant past visits, no prior imaging of the neck.  I do not feel imaging is necessary  tonight.  We will try cocktail of normal saline, ketorolac, prochlorperazine and a dose of oral cyclobenzaprine.  She feels much better after above-noted treatment.  She is discharged with prescription for cyclobenzaprine, told to  use over-the-counter analgesics as needed for pain, told to apply ice as needed.  Follow-up with PCP.  Final Clinical Impression(s) / ED Diagnoses Final diagnoses:  Torticollis, acquired  Elevated blood pressure reading with diagnosis of hypertension    Rx / DC Orders ED Discharge Orders         Ordered    cyclobenzaprine (FLEXERIL) 10 MG tablet  3 times daily PRN     09/19/19 3643           Delora Fuel, MD 83/77/93 [phone]

## 2019-09-19 NOTE — Discharge Instructions (Addendum)
Apply ice as needed.  Take ibuprofen, acetaminophen as needed.  Return if you area having any problems.

## 2020-03-11 DIAGNOSIS — Z23 Encounter for immunization: Secondary | ICD-10-CM | POA: Diagnosis not present

## 2020-03-11 DIAGNOSIS — R600 Localized edema: Secondary | ICD-10-CM | POA: Diagnosis not present

## 2020-03-11 DIAGNOSIS — E039 Hypothyroidism, unspecified: Secondary | ICD-10-CM | POA: Diagnosis not present

## 2020-03-11 DIAGNOSIS — N183 Chronic kidney disease, stage 3 unspecified: Secondary | ICD-10-CM | POA: Diagnosis not present

## 2020-03-11 DIAGNOSIS — E785 Hyperlipidemia, unspecified: Secondary | ICD-10-CM | POA: Diagnosis not present

## 2020-03-11 DIAGNOSIS — H6123 Impacted cerumen, bilateral: Secondary | ICD-10-CM | POA: Diagnosis not present

## 2020-03-11 DIAGNOSIS — Z6841 Body Mass Index (BMI) 40.0 and over, adult: Secondary | ICD-10-CM | POA: Diagnosis not present

## 2020-03-11 DIAGNOSIS — F3342 Major depressive disorder, recurrent, in full remission: Secondary | ICD-10-CM | POA: Diagnosis not present

## 2020-03-11 DIAGNOSIS — E1169 Type 2 diabetes mellitus with other specified complication: Secondary | ICD-10-CM | POA: Diagnosis not present

## 2020-03-11 DIAGNOSIS — M064 Inflammatory polyarthropathy: Secondary | ICD-10-CM | POA: Diagnosis not present

## 2020-03-11 DIAGNOSIS — I129 Hypertensive chronic kidney disease with stage 1 through stage 4 chronic kidney disease, or unspecified chronic kidney disease: Secondary | ICD-10-CM | POA: Diagnosis not present

## 2020-03-17 ENCOUNTER — Other Ambulatory Visit: Payer: Self-pay | Admitting: Internal Medicine

## 2020-03-17 DIAGNOSIS — Z1231 Encounter for screening mammogram for malignant neoplasm of breast: Secondary | ICD-10-CM

## 2020-06-17 IMAGING — CT CT ABD-PELV W/ CM
2 of 5 series · 16 of 46 positions shown, 18 images · IV contrast (ISOVUE)
Comparison: None.

CLINICAL DATA: Rectal bleeding since last night. History of recent
knee surgery. No bowel movement in 1 week.

EXAM:
CT ABDOMEN AND PELVIS WITH CONTRAST
TECHNIQUE: Multidetector CT imaging of the abdomen and pelvis was performed
using the standard protocol following bolus administration of
intravenous contrast.
CONTRAST:  100mL VC3MTO-Q22 IOPAMIDOL (VC3MTO-Q22) INJECTION 61%

[Series 2: axial st · axial · 0.98mm/px · z∈[+1082,+1472]mm · 13 of 92 slices shown, 15 images]
[im 7/92  soft-tissue]
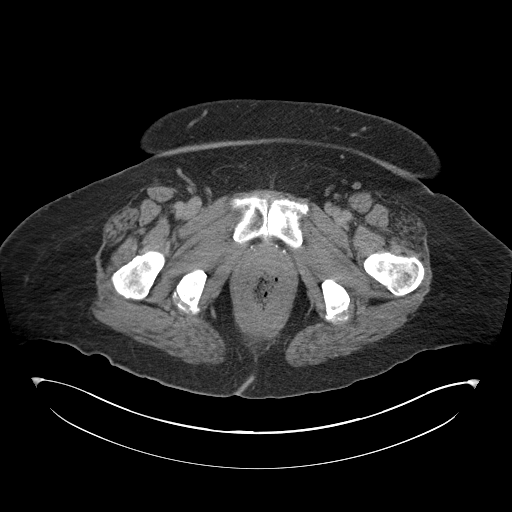
[im 7/92  bone]
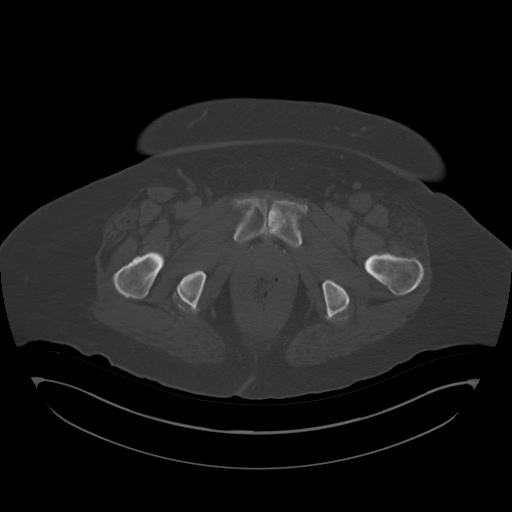
[im 13/92  soft-tissue]
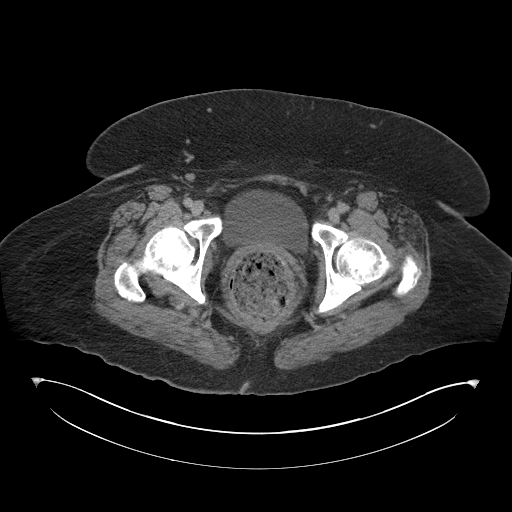
[im 19/92  soft-tissue]
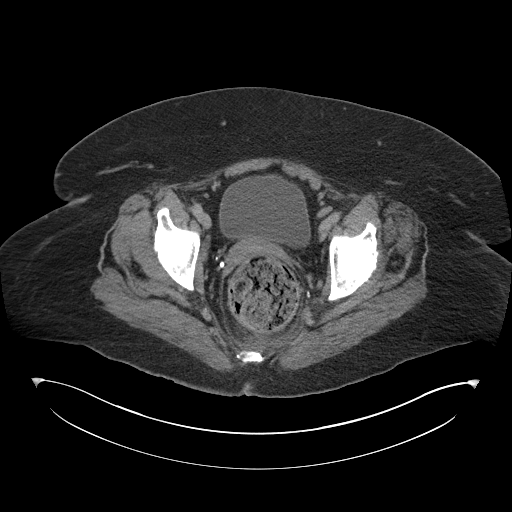
[im 25/92  soft-tissue]
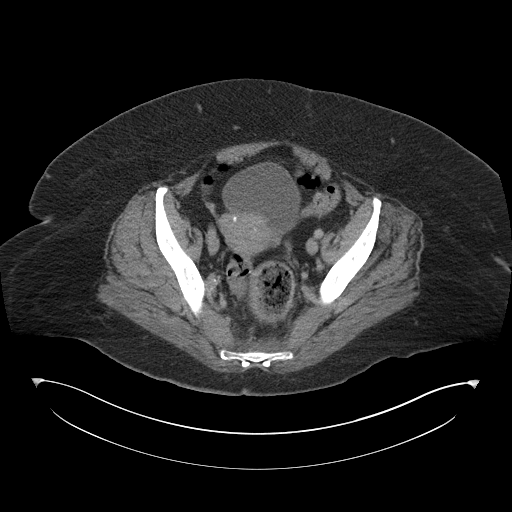
[im 31/92  soft-tissue]
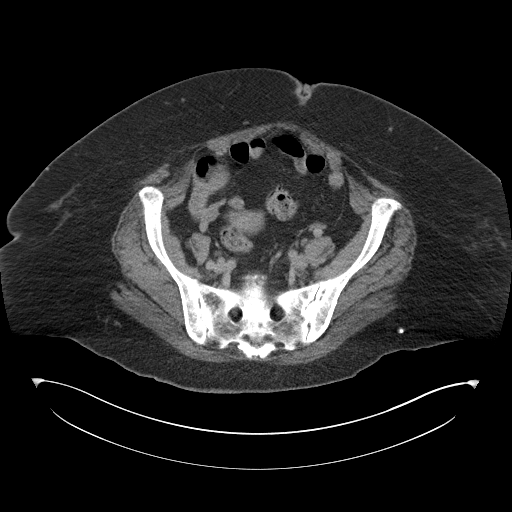
[im 37/92  soft-tissue]
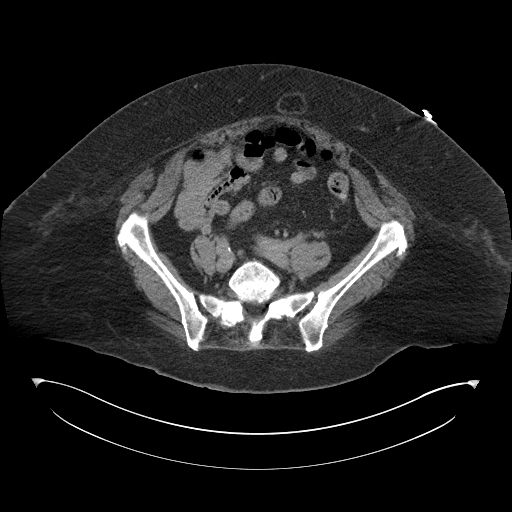
[im 49/92  soft-tissue]
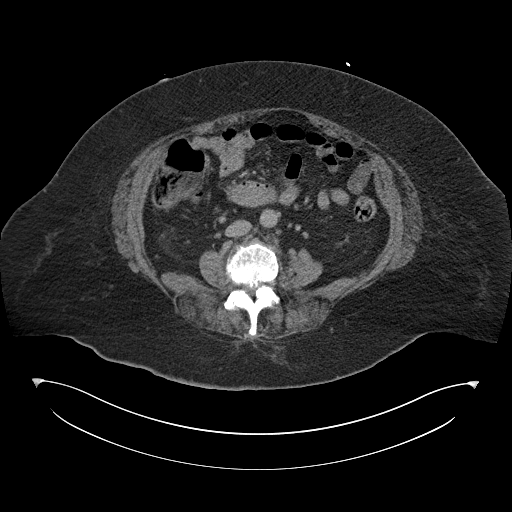
[im 55/92  soft-tissue]
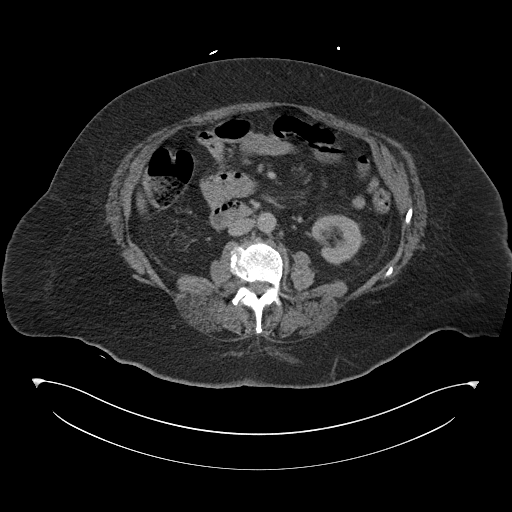
[im 61/92  soft-tissue]
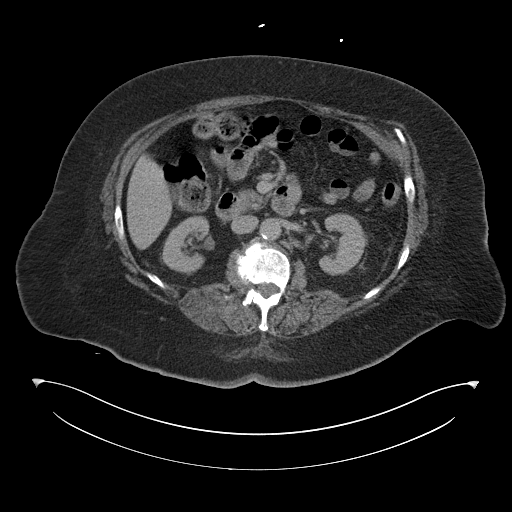
[im 61/92  bone]
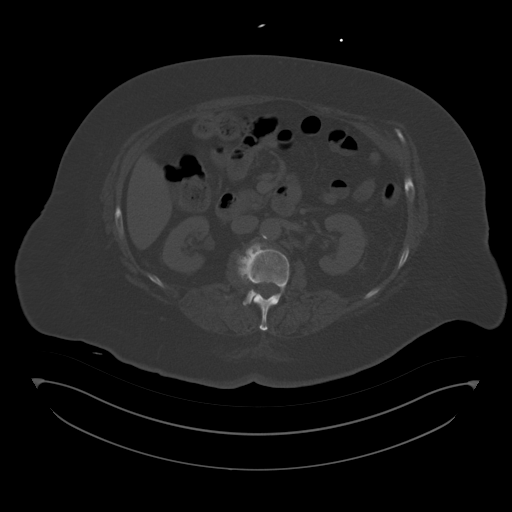
[im 67/92  soft-tissue]
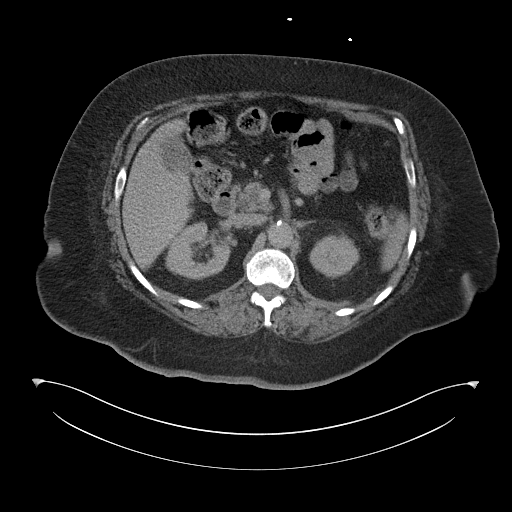
[im 73/92  soft-tissue]
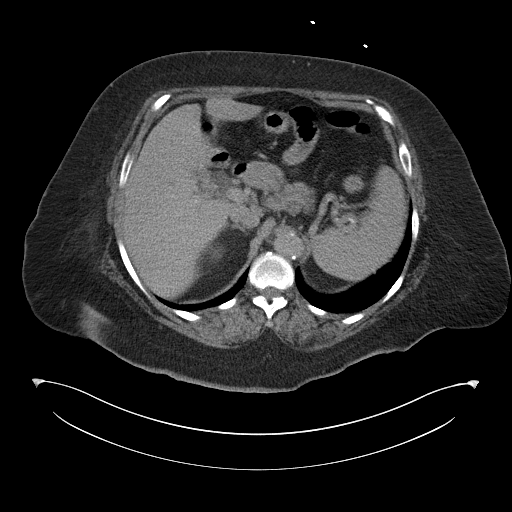
[im 79/92  soft-tissue]
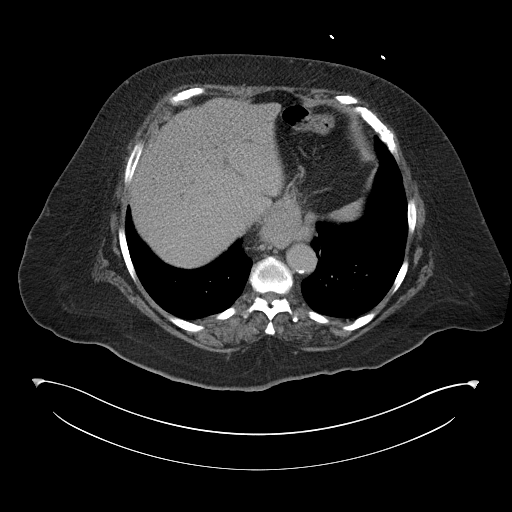
[im 85/92  soft-tissue]
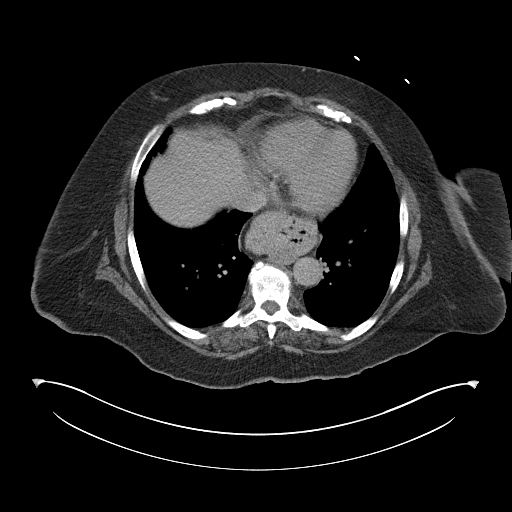

[Series 4: coronal st · coronal · 0.87mm/px · 3 of 123 slices shown]
[im 41/123  soft-tissue]
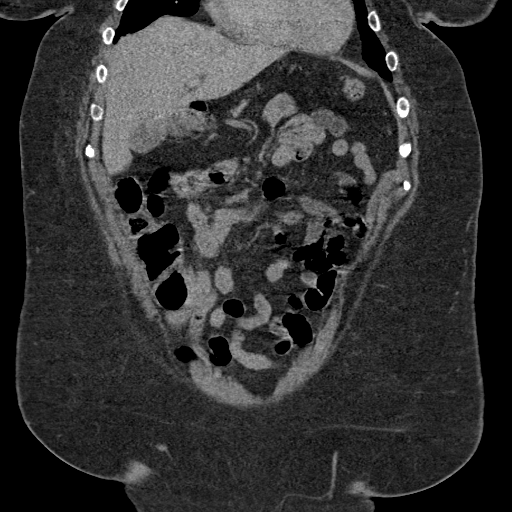
[im 55/123  soft-tissue]
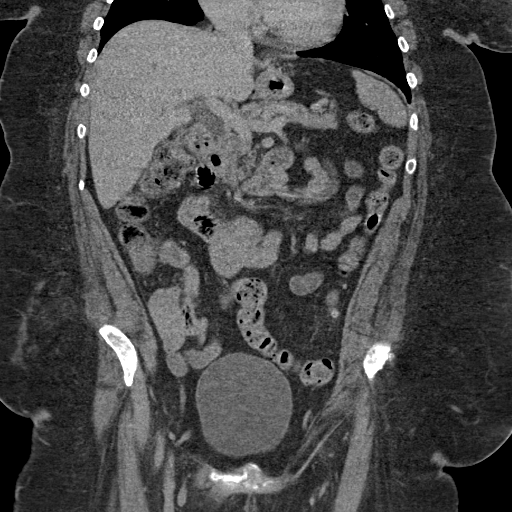
[im 68/123  soft-tissue]
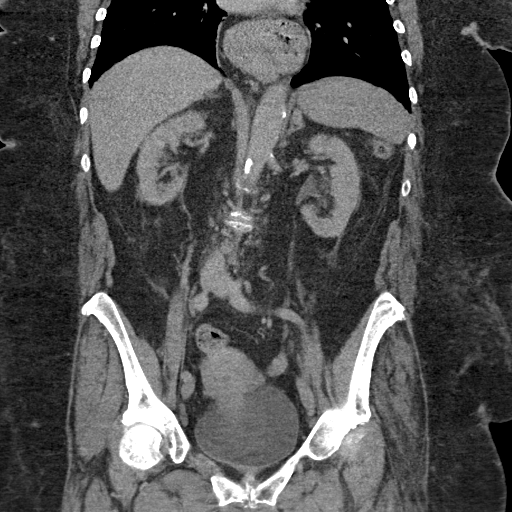

[16 of 46 positions shown; findings below may reference images not displayed]

FINDINGS: Lower chest: The lung bases are clear of an acute process. No
worrisome pulmonary lesions. The heart is normal in size. No
pericardial effusion. There is a large hiatal hernia.

Hepatobiliary: No focal hepatic lesions without contrast. There are
rim calcified gallstones in the gallbladder but no definite findings
for acute cholecystitis. Moderate common bile duct dilatation.
Maximum diameter is 15 mm. No obvious common bile duct stones.
Recommend correlation with liver function studies.

Pancreas: No mass, inflammation or ductal dilatation.

Spleen: Normal size.  No focal lesions.

Adrenals/Urinary Tract: The adrenal glands and kidneys are
unremarkable. No renal, ureteral or bladder calculi or mass.

Stomach/Bowel: Large hiatal hernia with a good portion of the
stomach up in the chest. The duodenum, small bowel and colon are
grossly normal without oral contrast. No acute inflammatory changes,
mass lesions or obstructive findings. Sigmoid colon diverticulosis
without findings for acute diverticulitis. There is a large amount
of stool in the rectum with mild rectal wall thickening and
perirectal inflammatory changes suggesting fecal impaction.

Vascular/Lymphatic: The aorta is normal in caliber. Scattered
atheroscerlotic calcifications. No mesenteric of retroperitoneal
mass or adenopathy. Small scattered lymph nodes are noted.

Reproductive: The uterus and ovaries are unremarkable.

Other: Small scattered pelvic lymph nodes but no mass or overt
adenopathy. No inguinal mass or adenopathy. Small periumbilical
abdominal wall hernia containing fat.

Musculoskeletal: No significant bony findings. Moderate osteoporosis
and moderate degenerative lumbar spondylosis with multilevel disc
disease and facet disease.
IMPRESSION: 1. Large amount of stool in the rectum with associated rectal wall
thickening and perirectal inflammatory changes suggesting fecal
impaction.
2. No other acute abdominal/pelvic findings are identified. No mass
lesions or adenopathy.
3. Cholelithiasis without CT findings to suggest acute
cholecystitis.
4. Moderate common bile duct dilatation without obvious common bile
duct stone. Recommend correlation with liver function studies.

## 2020-09-09 DIAGNOSIS — E1169 Type 2 diabetes mellitus with other specified complication: Secondary | ICD-10-CM | POA: Diagnosis not present
